# Patient Record
Sex: Male | Born: 1950 | Race: White | Hispanic: No | Marital: Married | Smoking: Former smoker
Health system: Southern US, Community
[De-identification: ages and names within clinical notes are randomized; demographics above are authoritative.]

## PROBLEM LIST (undated history)

## (undated) DIAGNOSIS — N529 Male erectile dysfunction, unspecified: Secondary | ICD-10-CM

## (undated) DIAGNOSIS — I10 Essential (primary) hypertension: Secondary | ICD-10-CM

## (undated) DIAGNOSIS — E785 Hyperlipidemia, unspecified: Secondary | ICD-10-CM

## (undated) DIAGNOSIS — R7989 Other specified abnormal findings of blood chemistry: Secondary | ICD-10-CM

## (undated) DIAGNOSIS — R000 Tachycardia, unspecified: Secondary | ICD-10-CM

## (undated) DIAGNOSIS — IMO0002 Reserved for concepts with insufficient information to code with codable children: Secondary | ICD-10-CM

## (undated) DIAGNOSIS — G2581 Restless legs syndrome: Secondary | ICD-10-CM

## (undated) DIAGNOSIS — M720 Palmar fascial fibromatosis [Dupuytren]: Secondary | ICD-10-CM

## (undated) DIAGNOSIS — G3183 Dementia with Lewy bodies: Secondary | ICD-10-CM

## (undated) DIAGNOSIS — R42 Dizziness and giddiness: Secondary | ICD-10-CM

## (undated) DIAGNOSIS — I951 Orthostatic hypotension: Secondary | ICD-10-CM

## (undated) DIAGNOSIS — G4730 Sleep apnea, unspecified: Secondary | ICD-10-CM

## (undated) DIAGNOSIS — E039 Hypothyroidism, unspecified: Secondary | ICD-10-CM

## (undated) DIAGNOSIS — M9250 Juvenile osteochondrosis of tibia and fibula, unspecified leg: Secondary | ICD-10-CM

## (undated) DIAGNOSIS — C4490 Unspecified malignant neoplasm of skin, unspecified: Secondary | ICD-10-CM

## (undated) DIAGNOSIS — M1990 Unspecified osteoarthritis, unspecified site: Secondary | ICD-10-CM

## (undated) DIAGNOSIS — I639 Cerebral infarction, unspecified: Secondary | ICD-10-CM

## (undated) DIAGNOSIS — R17 Unspecified jaundice: Secondary | ICD-10-CM

## (undated) DIAGNOSIS — N189 Chronic kidney disease, unspecified: Secondary | ICD-10-CM

## (undated) DIAGNOSIS — F329 Major depressive disorder, single episode, unspecified: Secondary | ICD-10-CM

## (undated) DIAGNOSIS — R413 Other amnesia: Secondary | ICD-10-CM

## (undated) DIAGNOSIS — R0600 Dyspnea, unspecified: Secondary | ICD-10-CM

## (undated) DIAGNOSIS — M92529 Juvenile osteochondrosis of tibia tubercle, unspecified leg: Secondary | ICD-10-CM

## (undated) DIAGNOSIS — F419 Anxiety disorder, unspecified: Secondary | ICD-10-CM

## (undated) DIAGNOSIS — K219 Gastro-esophageal reflux disease without esophagitis: Secondary | ICD-10-CM

## (undated) DIAGNOSIS — R945 Abnormal results of liver function studies: Secondary | ICD-10-CM

## (undated) DIAGNOSIS — M549 Dorsalgia, unspecified: Secondary | ICD-10-CM

## (undated) DIAGNOSIS — F32A Depression, unspecified: Secondary | ICD-10-CM

## (undated) DIAGNOSIS — F0280 Dementia in other diseases classified elsewhere without behavioral disturbance: Secondary | ICD-10-CM

## (undated) HISTORY — DX: Dorsalgia, unspecified: M54.9

## (undated) HISTORY — DX: Juvenile osteochondrosis of tibia and fibula, unspecified leg: M92.50

## (undated) HISTORY — DX: Orthostatic hypotension: I95.1

## (undated) HISTORY — DX: Sleep apnea, unspecified: G47.30

## (undated) HISTORY — DX: Dyspnea, unspecified: R06.00

## (undated) HISTORY — DX: Dementia in other diseases classified elsewhere, unspecified severity, without behavioral disturbance, psychotic disturbance, mood disturbance, and anxiety: F02.80

## (undated) HISTORY — DX: Unspecified malignant neoplasm of skin, unspecified: C44.90

## (undated) HISTORY — DX: Reserved for concepts with insufficient information to code with codable children: IMO0002

## (undated) HISTORY — DX: Other amnesia: R41.3

## (undated) HISTORY — PX: BASAL CELL CARCINOMA EXCISION: SHX1214

## (undated) HISTORY — DX: Juvenile osteochondrosis of tibia tubercle, unspecified leg: M92.529

## (undated) HISTORY — PX: CHOLECYSTECTOMY: SHX55

## (undated) HISTORY — PX: GALLBLADDER SURGERY: SHX652

## (undated) HISTORY — DX: Palmar fascial fibromatosis (dupuytren): M72.0

## (undated) HISTORY — DX: Depression, unspecified: F32.A

## (undated) HISTORY — PX: TONSILLECTOMY: SUR1361

## (undated) HISTORY — DX: Hypothyroidism, unspecified: E03.9

## (undated) HISTORY — DX: Dizziness and giddiness: R42

## (undated) HISTORY — DX: Anxiety disorder, unspecified: F41.9

## (undated) HISTORY — DX: Restless legs syndrome: G25.81

## (undated) HISTORY — DX: Male erectile dysfunction, unspecified: N52.9

## (undated) HISTORY — DX: Dementia with Lewy bodies: G31.83

## (undated) HISTORY — DX: Unspecified osteoarthritis, unspecified site: M19.90

## (undated) HISTORY — PX: APPENDECTOMY: SHX54

## (undated) HISTORY — DX: Major depressive disorder, single episode, unspecified: F32.9

---

## 1983-10-12 HISTORY — PX: CERVICAL FUSION: SHX112

## 1994-10-11 HISTORY — PX: LUMBAR FUSION: SHX111

## 1998-08-23 ENCOUNTER — Ambulatory Visit (HOSPITAL_COMMUNITY): Admission: RE | Admit: 1998-08-23 | Discharge: 1998-08-23 | Payer: Self-pay | Admitting: Neurosurgery

## 1998-08-23 ENCOUNTER — Encounter: Payer: Self-pay | Admitting: Neurosurgery

## 2000-09-20 ENCOUNTER — Emergency Department (HOSPITAL_COMMUNITY): Admission: EM | Admit: 2000-09-20 | Discharge: 2000-09-20 | Payer: Self-pay | Admitting: Emergency Medicine

## 2000-09-20 ENCOUNTER — Encounter: Payer: Self-pay | Admitting: Emergency Medicine

## 2000-12-13 ENCOUNTER — Ambulatory Visit (HOSPITAL_BASED_OUTPATIENT_CLINIC_OR_DEPARTMENT_OTHER): Admission: RE | Admit: 2000-12-13 | Discharge: 2000-12-13 | Payer: Self-pay | Admitting: Dentistry

## 2001-01-28 ENCOUNTER — Ambulatory Visit (HOSPITAL_COMMUNITY): Admission: RE | Admit: 2001-01-28 | Discharge: 2001-01-28 | Payer: Self-pay | Admitting: Neurosurgery

## 2001-01-28 ENCOUNTER — Encounter: Payer: Self-pay | Admitting: Neurosurgery

## 2001-10-11 HISTORY — PX: CARPAL TUNNEL RELEASE: SHX101

## 2001-11-22 ENCOUNTER — Emergency Department (HOSPITAL_COMMUNITY): Admission: EM | Admit: 2001-11-22 | Discharge: 2001-11-22 | Payer: Self-pay | Admitting: Emergency Medicine

## 2002-12-20 ENCOUNTER — Encounter (INDEPENDENT_AMBULATORY_CARE_PROVIDER_SITE_OTHER): Payer: Self-pay | Admitting: *Deleted

## 2002-12-20 ENCOUNTER — Ambulatory Visit (HOSPITAL_BASED_OUTPATIENT_CLINIC_OR_DEPARTMENT_OTHER): Admission: RE | Admit: 2002-12-20 | Discharge: 2002-12-20 | Payer: Self-pay | Admitting: Orthopedic Surgery

## 2003-04-25 ENCOUNTER — Ambulatory Visit (HOSPITAL_BASED_OUTPATIENT_CLINIC_OR_DEPARTMENT_OTHER): Admission: RE | Admit: 2003-04-25 | Discharge: 2003-04-25 | Payer: Self-pay | Admitting: Orthopedic Surgery

## 2006-06-20 ENCOUNTER — Encounter: Admission: RE | Admit: 2006-06-20 | Discharge: 2006-06-20 | Payer: Self-pay | Admitting: Family Medicine

## 2006-07-08 ENCOUNTER — Encounter (INDEPENDENT_AMBULATORY_CARE_PROVIDER_SITE_OTHER): Payer: Self-pay | Admitting: Specialist

## 2006-07-08 ENCOUNTER — Inpatient Hospital Stay (HOSPITAL_COMMUNITY): Admission: EM | Admit: 2006-07-08 | Discharge: 2006-07-13 | Payer: Self-pay | Admitting: Emergency Medicine

## 2006-07-19 ENCOUNTER — Encounter: Admission: RE | Admit: 2006-07-19 | Discharge: 2006-07-19 | Payer: Self-pay | Admitting: Surgery

## 2006-07-20 ENCOUNTER — Inpatient Hospital Stay (HOSPITAL_COMMUNITY): Admission: AD | Admit: 2006-07-20 | Discharge: 2006-07-29 | Payer: Self-pay | Admitting: Surgery

## 2006-07-27 ENCOUNTER — Ambulatory Visit: Payer: Self-pay | Admitting: Gastroenterology

## 2006-08-05 ENCOUNTER — Encounter: Admission: RE | Admit: 2006-08-05 | Discharge: 2006-08-05 | Payer: Self-pay | Admitting: Surgery

## 2006-08-10 ENCOUNTER — Encounter: Admission: RE | Admit: 2006-08-10 | Discharge: 2006-08-10 | Payer: Self-pay | Admitting: Surgery

## 2006-08-18 ENCOUNTER — Ambulatory Visit (HOSPITAL_COMMUNITY): Admission: RE | Admit: 2006-08-18 | Discharge: 2006-08-18 | Payer: Self-pay | Admitting: Surgery

## 2006-09-23 ENCOUNTER — Ambulatory Visit (HOSPITAL_COMMUNITY): Admission: RE | Admit: 2006-09-23 | Discharge: 2006-09-23 | Payer: Self-pay | Admitting: Gastroenterology

## 2006-09-24 ENCOUNTER — Inpatient Hospital Stay (HOSPITAL_COMMUNITY): Admission: EM | Admit: 2006-09-24 | Discharge: 2006-09-30 | Payer: Self-pay | Admitting: Emergency Medicine

## 2006-09-29 ENCOUNTER — Ambulatory Visit: Payer: Self-pay | Admitting: Gastroenterology

## 2006-10-04 ENCOUNTER — Emergency Department (HOSPITAL_COMMUNITY): Admission: EM | Admit: 2006-10-04 | Discharge: 2006-10-04 | Payer: Self-pay | Admitting: Emergency Medicine

## 2006-10-07 ENCOUNTER — Ambulatory Visit: Payer: Self-pay | Admitting: Gastroenterology

## 2006-10-07 LAB — CONVERTED CEMR LAB
ALT: 138 units/L — ABNORMAL HIGH (ref 0–40)
AST: 81 units/L — ABNORMAL HIGH (ref 0–37)
Albumin: 3.3 g/dL — ABNORMAL LOW (ref 3.5–5.2)
Alkaline Phosphatase: 418 units/L — ABNORMAL HIGH (ref 39–117)
BUN: 11 mg/dL (ref 6–23)
Basophils Absolute: 0.1 10*3/uL (ref 0.0–0.1)
Basophils Relative: 1.2 % — ABNORMAL HIGH (ref 0.0–1.0)
Bilirubin, Direct: 0.5 mg/dL — ABNORMAL HIGH (ref 0.0–0.3)
CO2: 31 meq/L (ref 19–32)
Calcium: 9.2 mg/dL (ref 8.4–10.5)
Chloride: 104 meq/L (ref 96–112)
Creatinine, Ser: 1.3 mg/dL (ref 0.4–1.5)
Eosinophil percent: 3.5 % (ref 0.0–5.0)
GFR calc non Af Amer: 61 mL/min
Glomerular Filtration Rate, Af Am: 74 mL/min/{1.73_m2}
Glucose, Bld: 89 mg/dL (ref 70–99)
HCT: 36.3 % — ABNORMAL LOW (ref 39.0–52.0)
Hemoglobin: 12.4 g/dL — ABNORMAL LOW (ref 13.0–17.0)
Lymphocytes Relative: 32.3 % (ref 12.0–46.0)
MCHC: 34.1 g/dL (ref 30.0–36.0)
MCV: 80.5 fL (ref 78.0–100.0)
Monocytes Absolute: 0.5 10*3/uL (ref 0.2–0.7)
Monocytes Relative: 7.1 % (ref 3.0–11.0)
Neutro Abs: 3.7 10*3/uL (ref 1.4–7.7)
Neutrophils Relative %: 55.9 % (ref 43.0–77.0)
Platelets: 299 10*3/uL (ref 150–400)
Potassium: 4.2 meq/L (ref 3.5–5.1)
RBC: 4.5 M/uL (ref 4.22–5.81)
RDW: 13.5 % (ref 11.5–14.6)
Sodium: 141 meq/L (ref 135–145)
Total Bilirubin: 1.3 mg/dL — ABNORMAL HIGH (ref 0.3–1.2)
Total Protein: 6.1 g/dL (ref 6.0–8.3)
WBC: 6.7 10*3/uL (ref 4.5–10.5)

## 2006-10-18 ENCOUNTER — Ambulatory Visit: Payer: Self-pay | Admitting: Gastroenterology

## 2006-10-18 LAB — CONVERTED CEMR LAB
ALT: 100 units/L — ABNORMAL HIGH (ref 0–40)
AST: 82 units/L — ABNORMAL HIGH (ref 0–37)
Albumin: 3.4 g/dL — ABNORMAL LOW (ref 3.5–5.2)
Alkaline Phosphatase: 292 units/L — ABNORMAL HIGH (ref 39–117)
BUN: 18 mg/dL (ref 6–23)
Basophils Absolute: 0.1 10*3/uL (ref 0.0–0.1)
Basophils Relative: 1.4 % — ABNORMAL HIGH (ref 0.0–1.0)
CO2: 31 meq/L (ref 19–32)
Calcium: 9.2 mg/dL (ref 8.4–10.5)
Chloride: 104 meq/L (ref 96–112)
Creatinine, Ser: 1.5 mg/dL (ref 0.4–1.5)
Eosinophil percent: 7.2 % — ABNORMAL HIGH (ref 0.0–5.0)
GFR calc non Af Amer: 52 mL/min
Glomerular Filtration Rate, Af Am: 63 mL/min/{1.73_m2}
Glucose, Bld: 101 mg/dL — ABNORMAL HIGH (ref 70–99)
HCT: 37.1 % — ABNORMAL LOW (ref 39.0–52.0)
Hemoglobin: 12.5 g/dL — ABNORMAL LOW (ref 13.0–17.0)
Lymphocytes Relative: 38.3 % (ref 12.0–46.0)
MCHC: 33.6 g/dL (ref 30.0–36.0)
MCV: 81.1 fL (ref 78.0–100.0)
Monocytes Absolute: 0.5 10*3/uL (ref 0.2–0.7)
Monocytes Relative: 10.7 % (ref 3.0–11.0)
Neutro Abs: 2.1 10*3/uL (ref 1.4–7.7)
Neutrophils Relative %: 42.4 % — ABNORMAL LOW (ref 43.0–77.0)
Platelets: 217 10*3/uL (ref 150–400)
Potassium: 4.1 meq/L (ref 3.5–5.1)
RBC: 4.58 M/uL (ref 4.22–5.81)
RDW: 13.9 % (ref 11.5–14.6)
Sodium: 140 meq/L (ref 135–145)
Total Bilirubin: 1.2 mg/dL (ref 0.3–1.2)
Total Protein: 6.5 g/dL (ref 6.0–8.3)
WBC: 5 10*3/uL (ref 4.5–10.5)

## 2006-10-19 ENCOUNTER — Ambulatory Visit: Payer: Self-pay | Admitting: Gastroenterology

## 2006-11-16 ENCOUNTER — Ambulatory Visit: Payer: Self-pay | Admitting: Gastroenterology

## 2006-11-16 LAB — CONVERTED CEMR LAB
ALT: 60 U/L — ABNORMAL HIGH
AST: 46 U/L — ABNORMAL HIGH
Albumin: 3.9 g/dL
Alkaline Phosphatase: 156 U/L — ABNORMAL HIGH
Bilirubin, Direct: 0.2 mg/dL
Total Bilirubin: 0.8 mg/dL
Total Protein: 6.6 g/dL

## 2007-01-20 ENCOUNTER — Ambulatory Visit: Payer: Self-pay | Admitting: Gastroenterology

## 2007-01-20 LAB — CONVERTED CEMR LAB
ALT: 78 units/L — ABNORMAL HIGH (ref 0–40)
AST: 78 units/L — ABNORMAL HIGH (ref 0–37)
Albumin: 3.8 g/dL (ref 3.5–5.2)
Alkaline Phosphatase: 111 units/L (ref 39–117)
BUN: 20 mg/dL (ref 6–23)
Basophils Absolute: 0 10*3/uL (ref 0.0–0.1)
Basophils Relative: 0.3 % (ref 0.0–1.0)
Bilirubin, Direct: 0.2 mg/dL (ref 0.0–0.3)
CO2: 31 meq/L (ref 19–32)
Calcium: 9.1 mg/dL (ref 8.4–10.5)
Chloride: 107 meq/L (ref 96–112)
Creatinine, Ser: 1.6 mg/dL — ABNORMAL HIGH (ref 0.4–1.5)
Eosinophils Absolute: 0.3 10*3/uL (ref 0.0–0.6)
Eosinophils Relative: 6 % — ABNORMAL HIGH (ref 0.0–5.0)
GFR calc Af Amer: 58 mL/min
GFR calc non Af Amer: 48 mL/min
Glucose, Bld: 121 mg/dL — ABNORMAL HIGH (ref 70–99)
HCT: 37.7 % — ABNORMAL LOW (ref 39.0–52.0)
Hemoglobin: 13.6 g/dL (ref 13.0–17.0)
Lymphocytes Relative: 32.1 % (ref 12.0–46.0)
MCHC: 36 g/dL (ref 30.0–36.0)
MCV: 83.3 fL (ref 78.0–100.0)
Monocytes Absolute: 0.7 10*3/uL (ref 0.2–0.7)
Monocytes Relative: 12.4 % — ABNORMAL HIGH (ref 3.0–11.0)
Neutro Abs: 2.7 10*3/uL (ref 1.4–7.7)
Neutrophils Relative %: 49.2 % (ref 43.0–77.0)
Platelets: 164 10*3/uL (ref 150–400)
Potassium: 4.9 meq/L (ref 3.5–5.1)
RBC: 4.52 M/uL (ref 4.22–5.81)
RDW: 13.4 % (ref 11.5–14.6)
Sodium: 142 meq/L (ref 135–145)
Total Bilirubin: 1.3 mg/dL — ABNORMAL HIGH (ref 0.3–1.2)
Total Protein: 6 g/dL (ref 6.0–8.3)
WBC: 5.4 10*3/uL (ref 4.5–10.5)

## 2007-01-25 ENCOUNTER — Ambulatory Visit: Payer: Self-pay | Admitting: Gastroenterology

## 2007-01-25 LAB — CONVERTED CEMR LAB
AFP-Tumor Marker: 7.5 ng/mL (ref 0.0–8.0)
Ammonia: 24 umol/L (ref 11–35)
Ceruloplasmin: 28 mg/dL (ref 21–63)
Ferritin: 65.3 ng/mL (ref 22.0–322.0)
HCV Ab: NEGATIVE
Hep B S Ab: NEGATIVE
Hepatitis B Surface Ag: NEGATIVE
INR: 1.2 (ref 0.9–2.0)
Prothrombin Time: 13.3 s (ref 10.0–14.0)
aPTT: 24.9 s — ABNORMAL LOW (ref 26.5–36.5)

## 2007-01-26 ENCOUNTER — Ambulatory Visit: Payer: Self-pay | Admitting: Internal Medicine

## 2007-01-31 ENCOUNTER — Ambulatory Visit: Payer: Self-pay | Admitting: Gastroenterology

## 2007-02-15 ENCOUNTER — Ambulatory Visit (HOSPITAL_COMMUNITY): Admission: RE | Admit: 2007-02-15 | Discharge: 2007-02-15 | Payer: Self-pay | Admitting: Neurology

## 2007-02-24 ENCOUNTER — Encounter: Admission: RE | Admit: 2007-02-24 | Discharge: 2007-05-25 | Payer: Self-pay | Admitting: Neurology

## 2007-03-01 ENCOUNTER — Encounter: Admission: RE | Admit: 2007-03-01 | Discharge: 2007-03-17 | Payer: Self-pay | Admitting: Psychology

## 2007-08-03 ENCOUNTER — Encounter: Admission: RE | Admit: 2007-08-03 | Discharge: 2007-08-03 | Payer: Self-pay | Admitting: Chiropractic Medicine

## 2007-10-02 ENCOUNTER — Encounter: Admission: RE | Admit: 2007-10-02 | Discharge: 2007-10-02 | Payer: Self-pay | Admitting: Family Medicine

## 2009-07-18 ENCOUNTER — Ambulatory Visit: Payer: Self-pay | Admitting: Cardiovascular Disease

## 2009-07-18 DIAGNOSIS — Z85828 Personal history of other malignant neoplasm of skin: Secondary | ICD-10-CM | POA: Insufficient documentation

## 2009-07-18 DIAGNOSIS — F329 Major depressive disorder, single episode, unspecified: Secondary | ICD-10-CM | POA: Insufficient documentation

## 2009-07-18 DIAGNOSIS — I951 Orthostatic hypotension: Secondary | ICD-10-CM | POA: Insufficient documentation

## 2009-07-18 DIAGNOSIS — F411 Generalized anxiety disorder: Secondary | ICD-10-CM | POA: Insufficient documentation

## 2009-08-01 ENCOUNTER — Ambulatory Visit: Payer: Self-pay | Admitting: Internal Medicine

## 2009-08-01 ENCOUNTER — Encounter: Payer: Self-pay | Admitting: Cardiovascular Disease

## 2009-08-01 ENCOUNTER — Ambulatory Visit: Payer: Self-pay

## 2009-08-01 ENCOUNTER — Ambulatory Visit (HOSPITAL_COMMUNITY): Admission: RE | Admit: 2009-08-01 | Discharge: 2009-08-01 | Payer: Self-pay | Admitting: Internal Medicine

## 2009-09-19 DIAGNOSIS — M928 Other specified juvenile osteochondrosis: Secondary | ICD-10-CM | POA: Insufficient documentation

## 2009-09-19 DIAGNOSIS — R42 Dizziness and giddiness: Secondary | ICD-10-CM | POA: Insufficient documentation

## 2009-09-19 DIAGNOSIS — M129 Arthropathy, unspecified: Secondary | ICD-10-CM | POA: Insufficient documentation

## 2009-09-19 DIAGNOSIS — E039 Hypothyroidism, unspecified: Secondary | ICD-10-CM | POA: Insufficient documentation

## 2009-09-19 DIAGNOSIS — M1990 Unspecified osteoarthritis, unspecified site: Secondary | ICD-10-CM | POA: Insufficient documentation

## 2009-09-19 DIAGNOSIS — R0602 Shortness of breath: Secondary | ICD-10-CM | POA: Insufficient documentation

## 2009-09-19 DIAGNOSIS — M549 Dorsalgia, unspecified: Secondary | ICD-10-CM | POA: Insufficient documentation

## 2009-09-19 DIAGNOSIS — M542 Cervicalgia: Secondary | ICD-10-CM | POA: Insufficient documentation

## 2009-09-19 DIAGNOSIS — IMO0002 Reserved for concepts with insufficient information to code with codable children: Secondary | ICD-10-CM | POA: Insufficient documentation

## 2010-11-01 ENCOUNTER — Encounter: Payer: Self-pay | Admitting: Gastroenterology

## 2010-11-10 NOTE — Assessment & Plan Note (Signed)
Summary: NP6/AMD   Visit Type:  Initial Consult Primary Provider:  STONE  CC:  ORTHOSTASIS.  History of Present Illness: 60 year-old male presents for initial evaluation of dizziness and orthostatic hypotension.  Describes episodic dizziness and near-syncope, occurring with position changes. Apparently had orthostatic hypotension documented in his primary physician's office.  Also with exertional dyspnea that occurs with physical activity and singing.  The pt's wife has noticed that he is short of breath during singing at [place]. The patient feels this all started because of an inhalation injury, but his PFT's were normal by his report (was told he had the lungs of a 60 year-old).   The patient was told at one time that he may have a 'thickened cardiac muscle', but he has never had an echo. EKG's have been ok in the past by family report.  The patient denies chest pain, palpitations, leg edema, orthopnea, or PND.  Preventive Screening-Counseling & Management  Alcohol-Tobacco     Alcohol drinks/day: 0     Smoking Status: quit     Year Quit: 1977  Caffeine-Diet-Exercise     Caffeine use/day: 0     Diet Comments: REGULAR     Does Patient Exercise: no      Drug Use:  no.    Current Medications (verified): 1)  Geodon 60 Mg Caps (Ziprasidone Hcl) .... 2 Tabt By Mouth At Night 2)  Lexapro 20 Mg Tabs (Escitalopram Oxalate) .... 2 Tab By Mouth Daily 3)  Alprazolam 1 Mg Tabs (Alprazolam) .Marland Kitchen.. 1 Tab By Mouth 3 Times A Day 4)  Levoxyl 175 Mcg Tabs (Levothyroxine Sodium) .Marland Kitchen.. 1 Tab By Mouth Daily 5)  Omeprazole 20 Mg Cpdr (Omeprazole) .Marland Kitchen.. 1 Tab By Mouth Daily 6)  Darvocet-N 100 100-650 Mg Tabs (Propoxyphene N-Apap) .Marland Kitchen.. 1 Tab By Mouth Three Times A Day 7)  Fish Oil 1000 Mg Caps (Omega-3 Fatty Acids) .Marland Kitchen.. 1 Tab By Mouth 3 Times A Day 8)  Calcium Carbonate 600 Mg Tabs (Calcium Carbonate) .... 2 Tab By Mouth Daily 9)  Vitamin B-1 100 Mg Tabs (Thiamine Hcl) .Marland Kitchen.. 1 Tab By Mouth Daily 10)   Multivitamins  Tabs (Multiple Vitamin) .Marland Kitchen.. 1 Tab By Mouth Daily  Allergies (verified): 1)  ! Pcn 2)  ! * Lamtical 3)  ! * Sulfur 4)  ! * Oxycontin 5)  ! Vicodin  Past History:  Past Medical History: Anxiety Skin cancer- basal cell on back x2 Depression fatigue elevated kidney enzymes erectile dysfunction arthritis thyroid disease lewy body dementia  Past Surgical History: gallbladder 2007-2008 basal cell carcinoma- 2008, 2010 lumbar spinal 1996 cervical fusion 1985 carpal tunnel 2003  Family History: Mother: + CAD + MI + stents in neck and heart ? kidneys Father: liver failure Siblings: sister: cirrhosis ([place])               brother- back problems  Social History: Disabled  Married  2 children Tobacco Use - Former.  Regular Exercise - no Drug Use - no Alcohol drinks/day:  0 Smoking Status:  quit Caffeine use/day:  0 Diet Comments:  REGULAR Does Patient Exercise:  no Drug Use:  no  Review of Systems       memory impairment, dementia, poor balance, depression, anxiety, erectile dysfunction, and back and joint pains. Otherwise negative except as per HPI.  Vital Signs:  Patient profile:   60 year old male Height:      68 inches Weight:      178.25 pounds BMI:  27.20 Pulse rate:   70 / minute Pulse (ortho):   61 / minute Pulse rhythm:   regular Resp:     16 per minute  Vitals Entered By: Mercer Pod (July 18, 2009 2:17 PM)    Physical Exam  General:  Supine blood pressure: 127/78, heart rate 60 Sitting blood pressure: 132/75, heart rate 66 Standing blood pressure: 144/82, heart rate 61  Pt is well-developed, flat affect, alert and oriented, no acute distress HEENT: normal Neck: no thyromegaly           JVP normal, carotid upstrokes normal without bruits Lungs: CTA Chest: equal expansion  CV: Apical impulse nondisplaced, RRR without murmur or gallop Abd: soft, NT, positive BS, no HSM, no bruit Back: no CVA tenderness Ext: no  clubbing, cyanosis, or edema        femoral pulses 2+ without bruits        pedal pulses 2+ and equal Skin: warm, dry, no rash Neuro: CNII-XII intact,strength 5/5 = b/l    EKG  Procedure date:  07/18/2009  Findings:      NSR, within normal limits, HR 62 bpm.  Impression & Recommendations:  Problem # 1:  ORTHOSTATIC HYPOTENSION (ICD-458.0) The patient's EKG and cardiovascular exam are within normal limits. 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 He could have periodic orthostatic hypotension related to this. Recommend a 2-D echocardiogram to rule out any structural heart disease. If this is normal, would not pursue further cardiac evaluation. I spoke with the patient and his wife about the importance of maintaining good fluid hydration, but this is difficult because of urinary incontinence. I will followup with the patient after his echocardiogram is complete. Orders: Echocardiogram (Echo)  Patient Instructions: 1)  Your physician recommends that you schedule a follow-up appointment in: 2 months 2)  Your physician has requested that you have an echocardiogram.  Echocardiography is a painless test that uses sound waves to create images of your heart. It provides your doctor with information about the size and shape of your heart and how well your heart's chambers and valves are working.  This procedure takes approximately one hour. There are no restrictions for this procedure.

## 2011-02-12 ENCOUNTER — Ambulatory Visit
Admission: RE | Admit: 2011-02-12 | Discharge: 2011-02-12 | Disposition: A | Discharge status: Home / Self Care | Payer: MEDICARE | Source: Ambulatory Visit | Attending: Internal Medicine | Admitting: Internal Medicine

## 2011-02-12 ENCOUNTER — Other Ambulatory Visit: Payer: Self-pay | Admitting: Internal Medicine

## 2011-02-12 DIAGNOSIS — M26609 Unspecified temporomandibular joint disorder, unspecified side: Secondary | ICD-10-CM

## 2011-02-12 MED ORDER — IOHEXOL 300 MG/ML  SOLN
75.0000 mL | Freq: Once | INTRAMUSCULAR | Status: AC | PRN
  Administered 2011-02-12: 75 mL via INTRAVENOUS

## 2011-02-26 NOTE — H&P (Signed)
NAME:  Hayden Rose, Hayden Rose NO.:  [id number]   MEDICAL RECORD NO.:  [id number]          PATIENT TYPE:  EMS   LOCATION:  ED                           FACILITY:  [facility]   PHYSICIAN:  Clovis Pu. Cornett, M.D.DATE OF BIRTH:  [birth year]   DATE OF ADMISSION:  07/08/2006  DATE OF DISCHARGE:                                HISTORY & PHYSICAL   CHIEF COMPLAINT:  Abdominal pain.   HISTORY OF PRESENT ILLNESS:  The patient is a 60 year old male with a two-  day history of epigastric right upper quadrant pain.  This started with some  indigestion-type symptoms about two days ago and has progressed to become  quite steady and 4-6/10 in intensity in his epigastrium and right upper  quadrant with radiation to his upper back.  It is associated with nausea and  vomiting.  He has also had no bowel movement for four days.  He is on  multiple medications for depression and degenerative disk disease and  degenerative joint disease.  It is unclear if eating makes it better or  worse in talking with the patient.   PAST MEDICAL HISTORY:  1. Degenerative joint disease.  2. Degenerative disk disease.  3. Depression.  4. Hypertension.  5. Hypothyroidism.   FAMILY HISTORY:  Noncontributory.   SOCIAL HISTORY:  He is married.  Denies tobacco or alcohol use.   ALLERGIES:  1. OXYCONTIN.  2. PENICILLIN.  3. SULFA.   MEDICATIONS:  1. Alprazolam 1 mg t.i.d.  2. Cyclobenzaprine 10 mg t.i.d.  3. Geodon 120 mg at bed time.  4. Hydrochlorothiazide 25 mg once daily.  5. Loratadine 10 mg once daily.  6. Mirtazapine 50 mg at bed time.  7. Naprosyn 500 mg b.i.d.  8. Omeprazole 20 mg once daily.  9. Acetaminophen 100 mg t.i.d.   PAST SURGICAL HISTORY:  History of back, neck and right wrist surgeries.   REVIEW OF SYSTEMS:  As stated above.  The 15-point review of systems is  otherwise negative.   PHYSICAL EXAMINATION:  VITAL SIGNS:  Temperature 97.4, pulse 96, blood  pressure 126/82.  GENERAL  APPEARANCE:  White male in no apparent distress.  HEENT:  Extraocular movements are intact.  No evidence of scleral icterus.  Oropharynx clear.  NECK:  Supple, nontender.  Full range of motion.  CHEST:  Clear to auscultation.  Chest wall motion normal.  CARDIOVASCULAR:  Regular rate and rhythm without murmur, rub or gallop.  ABDOMEN:  Soft with tenderness in the right upper quadrant over his right  costal margin.  There is some mild distention.  He does have some fullness  in his abdomen.  There is no organomegaly.  EXTREMITIES:  Muscle tone is normal.  Range of motion is normal.  NEUROLOGIC:  He has depressed mood and affect.  Otherwise, his motor and  sensory function are grossly intact.   DIAGNOSTIC STUDIES:  An ultrasound report shows gallstones and a thickened  gallbladder wall.  White count 12,300, hemoglobin 13.6.  Sodium 139,  potassium 4.2, chloride 95, CO2 33, BUN 20, creatinine 1.7, glucose 99,  total  bilirubin 1.6, alk. phos. normal.  AST and ALT are slightly elevated  at 48 and 53.  Urinalysis is normal.   IMPRESSION:  Acute cholecystitis and questionable gallstone pancreatitis.   PLAN:  We will admit for IV fluid, pain control and antibiotics.  Send  amylase and lipase tonight.  If these are within normal limits, we will  proceed with cholecystectomy in the morning.  I discussed the procedure with  the patient and his family to include the complications of both laparoscopy  and open cholecystectomy to include bleeding, infection and injury to other  structures.  Also, there may be a need to convert.  He may also have passed  a common duct stone and has to be evaluated with cholangiogram which will be  done at the time of operation.      Thomas A. Cornett, M.D.  Electronically Signed     TAC/MEDQ  D:  07/08/2006  T:  07/10/2006  Job:  [id number]

## 2011-02-26 NOTE — Op Note (Signed)
NAME:  Hayden, Rose NO.:  [id number]   MEDICAL RECORD NO.:  [id number]          PATIENT TYPE:  INP   LOCATION:  5743                         FACILITY:  [facility]   PHYSICIAN:  Georgiana Spinner, M.D.    DATE OF BIRTH:  05/20/1951   DATE OF PROCEDURE:  09/24/2006  DATE OF DISCHARGE:                               OPERATIVE REPORT   Mrs. Zeidman called me stating that yesterday Mr. Branam had an ERCP with  stent removal and two stone removals. He went home, got chilled, and she  called to the hospital and spoke to the procedure nurse and also this  was apparently discussed with Hedwig Morton. Juanda Chance, M.D. thinking this may  have been anesthesia related. However today, Saturday morning, the day  after the procedure the patient is complaining of fever, flushing, and I  suggested that she bring him to the emergency room for further  evaluation. Time is approximately 12:20 p.m. Saturday, September 24, 2006.           ______________________________  Georgiana Spinner, M.D.     GMO/MEDQ  D:  09/24/2006  T:  09/25/2006  Job:  [id number]   cc:   Teena Irani. Arlyce Dice, M.D.

## 2011-02-26 NOTE — Op Note (Signed)
NAME:  Hayden Rose, Hayden Rose                           ACCOUNT NO.:  [id number]   MEDICAL RECORD NO.:  [id number]                   PATIENT TYPE:  AMB   LOCATION:  DSC                                  FACILITY:  [facility]   PHYSICIAN:  Katy Fitch. Naaman Plummer., M.D.          DATE OF BIRTH:  [birth year]   DATE OF PROCEDURE:  04/25/2003  DATE OF DISCHARGE:                                 OPERATIVE REPORT   PREOPERATIVE DIAGNOSIS:  1. Chronic ulnar styloid ulnocarpal impingement, status post previous     hemiresection arthroplasty of distal radioulnar joint, right wrist.  2. Painful pisotriquetral articulation of right wrist with x-ray evidence of     cystic degenerative change at distal pole of pisiform.   POSTOPERATIVE DIAGNOSIS:  1. Chronic ulnar styloid ulnocarpal impingement, status post previous     hemiresection arthroplasty of distal radioulnar joint, right wrist.  2. Painful pisotriquetral articulation of right wrist with x-ray evidence of     cystic degenerative change at distal pole of pisiform.   OPERATION PERFORMED:  1. Excision of ulnar styloid through ulnar approach between sixth dorsal     compartment and flexor carpi ulnaris.  2. Resection of pisiform with repair of flexor carpi ulnaris tendon.   SURGEON:  Katy Fitch. Sypher, M.D.   ASSISTANT:  Jonni Sanger, P.A.   ANESTHESIA:  General by LMA.   SUPERVISING ANESTHESIOLOGIST:  Janetta Hora. Gelene Mink, M.D.   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  He is status post diagnostic  wrist arthroscopy which revealed evidence of chronic ulnocarpal abutment and  cartilaginous injury to the ulnar aspect of the lunate and triangular  fibrocartilage tear.  He is status post hemiresection arthroplasty due to a  painful distal radioulnar joint; however, due to the fact that he had a long  variant of the ulnar styloid, he appeared to have a chronic pain  syndrome  due to ulnar styloid carpal impingement.  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  Plain films of  his pisotriquetral joint demonstrated an erosion on the distal surface of  the pisiform and some irregularity of the pisotriquetral articulation.  There is some cystic change in the triquetrum as well suggesting a chronic  arthrosis.  Preoperative injection of the pisotriquetral joint with Depo-  Medrol and lidocaine gave transient relief.  After informed consent, he is  brought to the operating room at this time anticipating styloid excision and  pisiform excision.   DESCRIPTION OF PROCEDURE:  [name] was brought to the operating room and  placed in supine position on the operating table.  Following induction of  general anesthesia by LMA, 1g of Ancef was administered as an IV  prophylactic antibiotic.  The right arm was prepped with Betadine soap and  solution and sterilely draped.  A pneumatic tourniquet was applied to the  proximal brachium.   Following exsanguination of the right arm with an Esmarch bandage, the  arterial tourniquet was inflated to 230 mmHg.  The procedure commenced with  a longitudinal incision directly over the subcutaneous border of the ulna  between the sixth dorsal compartment and the flexor carpi ulnaris.  Care was  taken to carefully dissect the dorsal ulnar sensory branch.  The extensor  retinaculum was released on the ulnar aspect of the sixth dorsal compartment  and the extensor carpi ulnaris was retracted dorsally.  The styloid was  palpated and a Beaver blade was used to subperiosteally expose the distal  centimeter of the ulnar styloid.  A 2 mm osteotome was used to expose the  entire styloid subperiosteally followed by placement of baby Bennett  retractors.  A rongeur was used to piecemeal remove the ulnar  styloid down  to a level 1 mm below the height of the distal radiolunate facet.   The soft tissues were then irrigated thoroughly and palpated the residual  bone fragments.  The tear in the triangular fibrocartilage was easily  visualized.  The dead space created was closed with a mattress suture of 3-0  Ethibond followed by repair of the retinaculum with mattress suture of 3-0  Ethibond and repair of the skin with intradermal 3-0 Prolene. The  pisotriquetral joint had been carefully examined preoperatively.  [name]  experienced pain with ulnar deviation of the wrist, dorsiflexion and grind  of the pisotriquetral joint as well as full palmar flexion and grind of the  pisotriquetral joint.  Plain films had shown a cystic change in the distal  pole of the pisiform. Therefore, we elected to proceed with resection.   A curvilinear incision was fashioned directly over the ulnar aspect of the  palm at the distal wrist flexion crease.  Subcutaneous tissue were carefully  divided taking care to elevate the hypothenar muscles off the pisiform.  The  flexor carpi ulnaris was split in its midline and Beaver blades and a 2 mm  osteotome was used to circumferentially dissect the pisiform from the flexor  carpi ulnaris tendon.  The pisiform was removed piecemeal in its entirety.  Care was taken to protect the ulnar nerve throughout dissection.  The C-arm  fluoroscope was used to confirm satisfactory resection of the entire  pisiform.   The wound was inspected for bleeding points and subsequently, the flexor  carpi ulnaris was repaired with a mattress suture of 3-0 Ethibond.  The skin  was repaired with intradermal 3-0 Prolene.  There were no apparent  complications.   [name] tolerated the surgery and anesthesia well.  He was placed in a  voluminous gauze dressing with a volar plaster splint maintaining the wrist in 5 degrees dorsiflexion.  For aftercare he will be given prescriptions for   [facility] one to two tablets by mouth every four to six hours as  needed for pain, 30 tablets without refill, also Keflex 500 mg one by mouth  every eight hours times four days as a prophylactic antibiotic.                                                Katy Fitch Naaman Plummer., M.D.  RVS/MEDQ  D:  04/25/2003  T:  04/25/2003  Job:  [id number]

## 2011-02-26 NOTE — Assessment & Plan Note (Signed)
[facility]                         GASTROENTEROLOGY OFFICE NOTE   DINARI, STGERMAINE                        MRN:          [id number]  DATE:10/07/2006                            DOB:          28-Feb-1951    PRIMARY GASTROENTEROLOGIST:  Barbette Hair. Arlyce Dice, MD, [facility]   GI PROBLEM LIST:  1. Complex biliary history beginning with complicated cholecystectomy      in October of 2007 complicated by bile duct leak.  Status post      endoscopic retrograde cholangiopancreatography with stent placement      in October of 2007 by Dr. [name].  The stent was removed in      December of 2007.  Two bile duct stones were seen.  These were also      removed.  That night, the patient returned with sepsis and      cholangitis.  His clinical condition improved with antibiotics      after two to three days, but his liver tests then bumped back up.      Repeat endoscopic retrograde cholangiopancreatography was done by      me on September 28, 2006.  No recurrent stones were seen, but an      adequate biliary sphincterotomy was done to insure adequate      drainage.   INTERVAL HISTORY:  I last saw Mr. Lambertson when he was discharged from the  hospital about a week ago.  He represented on Christmas day which was  two or three days ago with some chills and some low-grade fevers.  Fortunately, his blood tests were much improved, and he was reassured  and sent home from the emergency room.  He is on his last day now of  ciprofloxacin 500 mg p.o. twice a day and presented today with continued  low-grade fevers but clinically continues to improve.  His pain is  present but is much improved.  He feels well.  His fatigue is improving.   LABORATORY DATA:  Lab tests done just prior to this visit show a normal  white cell count.  His bilirubin is 1.3 which is down from 1.4 three  days ago.  His alkaline phosphatase is still elevated although  improving.  Today it is 418.  His  transaminases are both abnormal but  are both improved from two days ago as well.   PHYSICAL EXAMINATION:  VITAL SIGNS:  Temperature 98.5, blood pressure  102/68, pulse 68.  CONSTITUTIONAL:  Generally well-appearing.  ABDOMEN:  Soft, nontender, nondistended.  Normal bowel sounds.   ASSESSMENT AND PLAN:  A 60 year old man with complicated biliary  history.   His liver tests have not completely normalized, although they are  trending down.  I think to be safe since they are still abnormal, I will  keep him on ciprofloxacin for 10 more days for the possibility that he  has an occult infection.  He will return then in 10 days to see either  myself or Dr. Arlyce Dice as an outpatient.  They know how to get in touch  with [place] if there are any further  concerns prior to then.  These low-  grade fevers that he has been having are pretty  underwhelming.  The most his wife says he has been is 99.8 or 99.9, and  today his temperature was normal.     Rachael Fee, MD  Electronically Signed    DPJ/MedQ  DD: 10/07/2006  DT: 10/07/2006  Job #: [id number]   cc:   Barbette Hair. Arlyce Dice, MD,FACG

## 2011-02-26 NOTE — Assessment & Plan Note (Signed)
[facility]                         GASTROENTEROLOGY OFFICE NOTE   SAJAN, CHEATWOOD                        MRN:          [id number]  DATE:01/25/2007                            DOB:          30-Apr-1951    PROBLEM:  Confusion.   REASON:  Mr. Hitchner has returned for reevaluation. Over the last 2  months he has been increasingly confused. He has had tremors. His  activity level has decreased. He was evaluated by Dr. Weinman__________  from neurology. An elevated ammonia level was demonstrated of 132. Liver  tests drawn yesterday were actually improved. Bilirubin was 1.3, alk  phos normal, AST 78, and albumin 3.8. There is no history of liver  disease including jaundice or hepatitis. 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   FAMILY HISTORY:  Negative for liver disease.   MEDICATIONS:  Include; cyclobenzaprine, Naprosyn, omeprazole, tramadol,  __________, Levoxyl, Geodon, alprazolam, loratadine, Lexapro, Darvocet,  Aricept, and now lactulose.   On exam he is alert and oriented x3. __________ is present.   PHYSICAL EXAMINATION:  HEENT: EOMI. PERRLA. Sclerae are anicteric.  Conjunctivae are pink.  NECK:  Supple without thyromegaly, adenopathy or carotid bruits.  CHEST:  Clear to auscultation and percussion without adventitious  sounds.  CARDIAC:  Regular rhythm; normal S1 S2.  There are no murmurs, gallops  or rubs.  ABDOMEN:  Bowel sounds are normoactive.  Abdomen is soft, non-tender and  non-distended.  There are no abdominal masses, tenderness, splenic  enlargement or hepatomegaly.  EXTREMITIES:  Full range of motion.  No cyanosis, clubbing or edema.  RECTAL:  Deferred.  There is no stigmata of liver disease.   IMPRESSION:  Mental confusion. With his elevated amylase I suspect that  he has a metabolic encephalopathy. There is no stigmata of liver disease  and I believe that cryptogenic cirrhosis  is less likely.   RECOMMENDATION:  1. Follow up abdominal ultrasound.  2. Check serologies for hepatitis B and C, AMA, ANA, alpha 1      fetoprotein levels, PT, PTT.  3. Continue lactulose.     Barbette Hair. Arlyce Dice, MD,FACG  Electronically Signed    RDK/MedQ  DD: 01/25/2007  DT: 01/25/2007  Job #: [id number]   cc:   Gloriajean Dell. Andrey Campanile, M.D.

## 2011-02-26 NOTE — Discharge Summary (Signed)
NAME:  Hayden Rose, Hayden Rose NO.:  [id number]   MEDICAL RECORD NO.:  [id number]          PATIENT TYPE:  INP   LOCATION:  6703                         FACILITY:  [facility]   PHYSICIAN:  Clovis Pu. Cornett, M.D.DATE OF BIRTH:  [birth year]   DATE OF ADMISSION:  07/20/2006  DATE OF DISCHARGE:  07/29/2006                                 DISCHARGE SUMMARY   ADMITTING DIAGNOSIS:  Status post open cholecystectomy, with cystic duct  stump leak.   DISCHARGE DIAGNOSES:  Status post open cholecystectomy, with cystic duct  stump leak.   PROCEDURES PERFORMED:  1. CT scan of the abdomen pelvis.  2. Percutaneous drainage of biloma.  3. ERCP by Dr. Arlyce Dice.   BRIEF HISTORY:  The patient is a 60 year old male who underwent an open  cholecystectomy at [facility] on  July 09, 2006 for gangrenous  cholecystitis.  He was readmitted on July 20, 2006 due to abdominal pain  and CT scan showing fluid and an elevation in his liver function studies.  ERCP was performed by Dr. Arlyce Dice which showed a cystic duct stump leakage,  and he was stented.  Percutaneous drainage of the biloma was performed as  well.   HOSPITAL COURSE:  The patient was in the hospital for the next nine days to  pain issues.  A repeat CT scan showed the decreasing size of his biloma, and  his white count slowly improved on IV Cipro.  He was having some fever, and  this resolved.  His diet was advanced, and he was discharged home and  July 29, 2006 after week's course of antibiotics and better pain control.   DISCHARGE INSTRUCTIONS:  The patient will follow up in 1-2 weeks.  His drain  will be left in place twice until I see him back in the office and we repeat  his HIDA study.  He will go home on Cipro 500 mg p.o. b.i.d., and Vicodin  for pain.  He will resume his preoperative medications.   CONDITION AT DISCHARGE:  Improved.      Thomas A. Cornett, M.D.  Electronically Signed     TAC/MEDQ  D:  08/25/2006   T:  08/25/2006  Job:  [id number]

## 2011-02-26 NOTE — Discharge Summary (Signed)
NAME:  Hayden Rose, Hayden Rose NO.:  [id number]   MEDICAL RECORD NO.:  [id number]          PATIENT TYPE:  INP   LOCATION:  5743                         FACILITY:  [facility]   PHYSICIAN:  Rachael Fee, MD   DATE OF BIRTH:  01/22/1951   DATE OF ADMISSION:  09/24/2006  DATE OF DISCHARGE:  09/30/2006                               DISCHARGE SUMMARY   ADMITTING DIAGNOSIS:  1. Abdominal pain chills, fevers following endoscopic retrograde      cholangiopancreatography stent removal and stone removal on      September 22, 2006.  2. History of open cholecystectomy of gangrenous gallbladder on      July 09, 2006.  3. Postoperative bile duct leak with readmission July 20, 2006 to      July 29, 2006.  Underwent a percutaneous drainage of biloma.      Underwent endoscopic retrograde cholangiopancreatography with      placement of stent to the bile duct on November 11 by Dr. Arlyce Dice.  4. Severe long-term chronic depression.  5. Status post appendectomy.  6. Status post tonsillectomy.  7. History of cervical fusion.  8. History of lower spine surgery twice.  9. Hypertension.  10.History of Osgood-Schlatter disease.  11.History of tension headaches.  12.History of hypothyroidism.   DISCHARGE DIAGNOSIS:  1. Abdominal pain, fever and chills following endoscopic retrograde      cholangiopancreatography, stone and stent removal.  2. Probably fleeting cholangitis.  3. Status post endoscopic retrograde cholangiopancreatography by Dr.      Rob Bunting on September 28, 2006.  The exam was normal.  There      were no retained stones or sludge.  A biliary sphincterotomy was      performed.  Noted, was retained food in the stomach, question      whether the gastroparesis is from narcotics or an underlying      process.  4. Chronic pain for which he is on multiple analgesics and neurologic      agents, chronic narcotics.  5. Profound depression, suspect his depression colors his  perception      of somatic pain.  6. Abnormal LFTs secondary to cholangitis.  No evidence for ERCP-      associated pancreatitis.  7. Mildly elevated glucose with maximum serum glucose of 130.   PROCEDURES:  ERCP by Dr. Christella Hartigan.   CONSULTATIONS:  None.   BRIEF HISTORY:  [name] is a 60 year old white male who suffers from  profound depression.  His GI problems date back at least to his open  cholecystectomy in September 2007, which was a open procedure because of  a gangrenous gallbladder.  He developed postoperative leak and was  readmitted.  He had ERCP with stent placement.  He also drainage of the  biloma.  On September 22, 2006, he had an outpatient ERCP and removal of  the stent.  Two stones were encountered and Dr. Arlyce Dice removed these  stones.  He did not perform a sphincterotomy.  That evening the patient  developed chills and subjective fevers, crampy pain, and was feeling  quite unwell.  He returned to the emergency room and was admitted for  further care by Dr. Sabino Gasser, who was covering GI service for Dr.  Arlyce Dice.  At the time of his arrival in the emergency room, his  temperature reached a measurement of 101.6.   LABORATORY:  Initial total bilirubin 6.3, it was 4.2 at discharge.  Alkaline phosphatase initially 203, it was 466 at discharge.  AST was  195 on admission, 170 at discharge.  ALT 272, 213 at discharge.  Amylase  66, lipase 18.  Albumin 3.3.  BUN 6, creatinine 1.2.  Sodium 138,  potassium 4.4.  Chloride 106, CO2 27.  Glucose ranged between 130-100.  PT 14.1, INR 1.1.  Initial white blood cell count 11, corrected to 7.7.  Hemoglobin 12.6, hematocrit 36.8.  MCV 80.5.  Platelets low of 125, 207  at discharge.  Blood cultures negative growth.  CT scan on September 24, 2006, showed no evidence for bile leak or hematoma and was generally  unremarkable.  CT scan of September 29, 2006, 1-day following the ERCP by  Dr. Christella Hartigan, showed slight stranding in the fat  planes surrounding the  course of the extrahepatic biliary ducts, may represent a reaction to  the ERCP sphincterotomy.  No focal fluid collection or leak identified.  There was a small amount of fluid along the inferior aspect right lobe  of liver that may represent residue from prior leak.  Prominent  appearance of the second portion of the duodenum noted.  External  intraluminal hematoma cannot be excluded.  Pelvic images were within  normal limits.   HOSPITAL COURSE:  1. Cholangitis and jaundice.  The patient's symptoms of abdominal pain      were slow to improve. The patient was treated with the following      antibiotics metronidazole p.o. and Cipro IV, actually converted to      p.o. Cipro.  He is to go home on all oral ciprofloxacin only.      However, he was eating well and ambulating.  Note, that the patient      is narcotic-dependent for musculoskeletal pain and therefore,      probably required more frequent narcotics than someone who was      narcotics naive.  Because of persistent pain and persistent      elevation of LFTs, Dr. Christella Hartigan pursued an ERCP and performed      sphincterotomy.  Following this procedure, he felt more      uncomfortable and was also complaining of testicular discomfort.      We got the second CT scan that did show some changes as noted      above.  However, there was no evidence for bile leak.  Within the      next 24-hours, the patient's abdominal discomfort was better.  The      testicular comfort was resolved and he was felt stable for      discharge.  He was eating a low-fat diet without problems.  2. Depression, anxiety.  The patient has a significant depression.  He      has a lot of vegetative symptoms and has no affect.  His wife      pretty much speaks for him when she is available and he is laconic      in answering any questions.   CONDITION ON DISCHARGE:  Stable and improved.   MEDICATIONS:  At discharge: 1. Geodon 120 mg at  bedtime.  2. Tramadol 100 mg three times a day.  3. Levoxyl 175 mcg once daily.  4. Xanax 1 mg three times daily.  5. Claritin 10 mg one time a day.  6. Remeron 15 mg at bedtime.  7. Flexeril 10 mg three times a day.  8. Ciprofloxacin 500 mg two times a day for 7 days.   The patient was advised to stop Naprosyn for the time being.  This this  potentially could be restarted by Dr. Arlyce Dice at the next office visit  which is January 9 at 3:00 p.m.  He is to go to the lab on October 18, 2006, for a comprehensive metabolic profile.      Jennye Moccasin, PA-C      Rachael Fee, MD  Electronically Signed    SG/MEDQ  D:  09/30/2006  T:  09/30/2006  Job:  [id number]

## 2011-02-26 NOTE — Consult Note (Signed)
NAME:  Hayden Rose, Hayden Rose NO.:  [id number]   MEDICAL RECORD NO.:  [id number]          PATIENT TYPE:  EMS   LOCATION:  MAJO                         FACILITY:  [facility]   PHYSICIAN:  Jordan Hawks. Elnoria Howard, MD    DATE OF BIRTH:  03-Sep-1951   DATE OF CONSULTATION:  10/04/2006  DATE OF DISCHARGE:                                 CONSULTATION   EMERGENCY ROOM GI CONSULTATION:   REASON FOR CONSULTATION:  Possible fever and worsening of his jaundice.   HISTORY OF PRESENT ILLNESS:  This is a 60 year old gentleman with a past  medical history of open cholecystectomy in October 2007 for gangrenous  cholecystitis, choledocholithiasis, biliary leak, severe depression and  history of chronic back pain who presents to the emergency room with  complaints of a low grade fever and worsening of his jaundice.  The  patient has had a very complicated course since his emergent  cholecystectomy in October 2007.  Since that time, he did develop a bile  leak, as well as choledocholithiasis, which had resulted in any  ascending cholangitis.  The patient subsequently underwent to ERCPs by  Dr. Arlyce Dice and Dr. Christella Hartigan with removal of the stones, as well as  placement of a biliary stent for treatment of the bile leak.  The  patient underwent stent removal by Dr. Christella Hartigan on September 28, 2006.  At  that time, the examination appeared to be normal.  A biliary  sphincterotomy was performed at that time, and there was no retention of  any stones or sludge.  The patient was recently discharged on September 30, 2006, and since that time he is still recuperating; however, the  patient's wife reports that last evening he had shaking chills, as well  as a low grade fever in the 99 range.  Subsequently, his temperature did  drop down to 96.  Because of his past medical and complaints, he was  subsequently brought into the emergency room for further evaluation.   PAST MEDICAL AND PAST SURGICAL HISTORY:  As stated  above.   FAMILY HISTORY:  Noncontributory.   SOCIAL HISTORY:  No tobacco use.   ALLERGIES:  1. LAMICTAL.  2. SULFA.  3. PENICILLIN.  4. OXYCONTIN.  5. Possible VICODIN.   REVIEW OF SYSTEMS:  Significant for weakness, fatigue.  No abdominal  pain, nausea, vomiting.  Questionable jaundice.  No diarrhea or  constipation.  No arthritis or arthralgias.  No dizziness or blurry  vision.  Positive for chills and low-grade fever.  No new skin rashes,  and no new neurologic.   PHYSICAL EXAMINATION:  VITAL SIGNS:  Blood pressure is 121/78, heart  rate 86, temperature is 97.5 and respirations 20.  GENERAL:  The patient is weak in appearance; however, he is alert and  oriented.  HEENT:  Normocephalic, atraumatic.  Extraocular muscles intact.  Pupils  equal, round and reactive to light.  NECK:  Supple.  No lymphadenopathy.  LUNGS:  Clear to auscultation bilaterally.  CARDIOVASCULAR:  Regular rate and rhythm without murmurs, gallops or  rubs.  ABDOMEN:  Flat, soft, nontender, nondistended.  There is a right upper  quadrant scar.  EXTREMITIES:  No clubbing, cyanosis or edema.   LABORATORY VALUES:  On October 04, 2006, white blood cell count is 5.6,  hemoglobin is 11.9, platelets at 250.  Sodium is 139, potassium 4.1,  chloride 104, CO2 of 27, glucose 86, BUN 15, creatinine 1.2, total  bilirubin is 1.4, alkaline phosphatase 446, AST 91, ALT is 154.  On  September 30, 2006, AST was noted to be at 170, ALT 213, alkaline  phosphatase 440, 66 and total bilirubin was at 4.2.  Overall, the  patient appears to be clinically stable.   IMPRESSION:  1. Abnormal liver enzymes.  2. History of choledocholithiasis.  3. History of gangrenous gallbladder.   After evaluation of the patient, he appears to be clinically stable at  this time, although he is weak in appearance.  I am uncertain about the  cause of the patient's complaints of chills.  He continues to be on  ciprofloxacin at this time twice  per day.  The laboratory values in  regards to his liver panel certainly reveal an improvement in his  transaminases.  His total bilirubin has also declined from 4.2 to 1.4  which is a positive improvement, and he is able to drain from a his  biliary tract.  I do not believe any endoscopic intervention is required  at this time.  The patient is to have a follow-up with his primary care  Neleh Muldoon tomorrow, and any changes can be evaluated at that time.  Certainly, the patient can call for any change in his symptoms, and the  patient is also to continue on his ciprofloxacin.      Jordan Hawks Elnoria Howard, MD  Electronically Signed     PDH/MEDQ  D:  10/04/2006  T:  10/04/2006  Job:  [id number]   cc:   Rachael Fee, MD  Barbette Hair. Arlyce Dice, MD,FACG

## 2011-02-26 NOTE — H&P (Signed)
NAME:  Hayden Rose, Hayden Rose NO.:  [id number]   MEDICAL RECORD NO.:  [id number]          PATIENT TYPE:  INP   LOCATION:  6703                         FACILITY:  [facility]   PHYSICIAN:  Clovis Pu. Cornett, M.D.DATE OF BIRTH:  02-01-1951   DATE OF ADMISSION:  07/20/2006  DATE OF DISCHARGE:                                HISTORY & PHYSICAL   CHIEF COMPLAINT:  Abdominal pain, chest pain.   HISTORY OF PRESENT ILLNESS:  Mr. Kronberg is a 60 year old male ten days out  from an open cholecystectomy due to gangrenous cholecystitis.  He has had  problems last three days with increasing right chest, right shoulder,  and  right upper quadrant pain.  He had a CT scan done two days ago which showed  no evidence of a myeloma or biliary ductal dilatation.  The liver function  was significant for an elevated alkaline phosphatase but normal bilirubin.  He had a white count of 15,000.  He had severe constipation which has been  treated; this with has helped some of his discomfort, but he continues now  to have right abdominal pain and right chest pain.  He comes in today for me  to reassess him.   PAST MEDICAL HISTORY:  1. Severe depression, on multiple anti depressive medications.  2. History of chronic back pain.  3. History of chronic neck pain.   PAST SURGICAL HISTORY:  1. Open cholecystectomy.  2. Three hand surgeries.  3. Appendectomy.  4. Tonsillectomy.   ALLERGIES:  LAMICTAL, SULFA, PENICILLIN, OXYTOCIN, OXYCONTIN for pain and  questionable VICODIN.   SOCIAL HISTORY:  Denies tobacco abuse.   FAMILY HISTORY:  Positive for heart disease and high blood pressure.   PHYSICAL EXAMINATION:  VITAL SIGNS:  Temperature 98, pulse 85.  GENERAL APPEARANCE:  White male in mild distress.  HEENT: No scleral icterus.  Oropharynx clear.  NECK:  Supple, nontender.  CHEST:  Clear to auscultation.  Chest wall motion is normal.  ABDOMEN:  Soft, nontender.  Right upper quadrant incision has healed  well  without signs of infection.  No rebound or guarding.  EXTREMITIES: No edema.  No calf tenderness bilaterally.   IMPRESSION:  Postoperative pain and dehydration after open cholecystectomy.   PLAN:  Will admit to hospital for evaluation.      Thomas A. Cornett, M.D.  Electronically Signed     TAC/MEDQ  D:  07/20/2006  T:  07/21/2006  Job:  [id number]

## 2011-02-26 NOTE — Assessment & Plan Note (Signed)
[facility]                         GASTROENTEROLOGY OFFICE NOTE   NAME:WILSONVadim, Centola                        MRN:          [id number]  DATE:01/31/2007                            DOB:          1951-07-10    PROBLEM:  Confusion.  Mr. Pella has returned for a scheduled GI  followup.  The family reports that he is not improved with lactulose.  He is having diarrhea.  Serologies for hepatitis B and C, AMA, ANA,  ceruloplasmin, and alpha-fetoprotein levels were normal.  Albumin  remains normal.  Latest set of LFTs pertinent for an AST and ALT in the  70 range.  Repeat ultrasound demonstrated a bile duct measuring 10 mm  and moderate increased echogenicity of the liver, suggestive of fatty  infiltration.  Repeat ammonia level was normal.   EXAM:  Pulse 100, blood pressure 110/72, weight 179.  He has minimal asterixis.  He is alert and oriented x3.   IMPRESSION:  Mild metabolic encephalopathy.  I think it is very unlikely  that he has chronic liver disease.  Abnormal liver tests are probably  due to mild hepatic steatosis.   RECOMMENDATIONS:  1. Discontinue lactulose.  2. Follow up with Dr. Orlin Hilding.  3. No further GI workup at this time.     Barbette Hair. Arlyce Dice, MD,FACG  Electronically Signed    RDK/MedQ  DD: 01/31/2007  DT: 01/31/2007  Job #: [id number]   cc:   Santina Evans A. Orlin Hilding, M.D.  Gloriajean Dell. Andrey Campanile, M.D.

## 2011-02-26 NOTE — Assessment & Plan Note (Signed)
[facility]                         GASTROENTEROLOGY OFFICE NOTE   NAME:Hayden Rose, Hayden Rose                        MRN:          [id number]  DATE:10/19/2006                            DOB:          1951/02/22    PROBLEM:  Abnormal liver tests.  [name] has returned for scheduled  GI followup.  In October, he underwent ERCP and stent placement for a  bile duct leak.  Followup ERCP on December 14 demonstrated no further  extravasation, but 2 bile duct stones were seen and removed.  He  subsequently was admitted with cholangitis.  A followup ERCP did not  demonstrate any stones.  A sphincterotomy was made.  Abnormal liver  tests were noted.  Since his discharge, though, he has felt well and has  been without fever or chills.   On December 28, alkaline phosphatase was 418 and total bilirubin was  1.3, and AST was 81.  On January 8, alkaline phosphatase was 292,  bilirubin 1.2, and AST is still 82.  [name] continues to feel well.   EXAM:  Pulse 95, blood pressure 90/60, weight 174.   IMPRESSION:  Cholangitis following ERCP and stone extraction - probably  secondary to edema at the sphincter of Oddi.  He currently has  persistently cholestatic liver tests, though they are improving.  There  is no evidence for retained stones.   RECOMMENDATIONS:  No further GI workup.  I will follow up his LFTs in 4  weeks and then I will continue if they remain abnormal.     Barbette Hair. Arlyce Dice, MD,FACG  Electronically Signed    RDK/MedQ  DD: 10/19/2006  DT: 10/19/2006  Job #: [id number]   cc:   Thomas A. Cornett, M.D.

## 2011-02-26 NOTE — Op Note (Signed)
NAME:  ATTILA, Hayden Rose                           ACCOUNT NO.:  [id number]   MEDICAL RECORD NO.:  [id number]                   PATIENT TYPE:  AMB   LOCATION:  DSC                                  FACILITY:  [facility]   PHYSICIAN:  Katy Fitch. Naaman Plummer., M.D.          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   PROCEDURES:  1. Diagnostic arthroscopy, right radiocarpal and ulnocarpal joint, with     debridement of dorsal scar, triangular fibrocartilage, and further     chondromalacia on ulnar aspect of lunate.  2. Open resection of distal ulnar utilizing a hemiresection arthroplasty     technique of Bowers.   SURGEON:  Katy Fitch. Sypher, M.D.   ASSISTANT:  Jonni Sanger, P.A.   ANESTHESIA:  Infraclavicular block supervised by the anesthesiologist, Quita Skye. Krista Blue, M.D.   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  Clinical  examination revealed evidence of chronic ulnocarpal abutment with a positive  ulnar variant and significant radiographic changes in the ulnar aspect of  the lunate.   He was brought to the operating room on 11/23/01, Hayden which time diagnostic  arthroscopy revealed significant signs of ulnocarpal abutment and a  triangular fibrocartilage degenerative predicament.   We completed arthroscopic debridement of his chondromalacia on the lunate  and debrided his triangular fibrocartilage, followed by a Feldon 3 mm distal  ulnar shortening.   In the immediate postoperative period he had good pain relief.  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   Serial x-rays revealed narrowing of the distal radioulnar joint and signs of  progressive arthrosis.   Due to a failure to relieve his pain with the limited  previous procedure, we  recommended proceeding with a formal hemiresection arthroplasty of the  distal ulna in the manner of Bowers.   After informed consent, he is brought to the operating room Hayden this time.   Preoperatively we advised him to proceed with a repeat diagnostic  arthroscopy to be certain that there were no other issues within the  radiocarpal and ulnocarpal articulation.   DESCRIPTION OF PROCEDURE:  The patient was brought to the operating room and  placed in supine position on the operating table.  Following infraclavicular  block in the holding area by Dr. Krista Blue, anesthesia was complete in the  right arm.   The arm was prepped with Betadine soap and solution and sterilely draped.  Ancef 1 g was administered as an IV prophylactic antibiotic.   Following exsanguination of  the right arm with an Esmarch bandage, an  arterial tourniquet was placed to 250 mmHg on the proximal brachium.   The procedure commenced with distraction of the patient's wrist with the aid  of a tower designed for wrist arthroscopy with index and long finger  fingertraps and counter traction the forearm.  Ten pounds of distraction was  applied.   The scope was introduced through a standard 3/4 dorsal portal after  distention of the joint with sterile saline utilizing an 18-gauge needle.  The scope was introduced with blunt technique, followed by diagnostic  arthroscopy.   There was considerable scar between the ulnar aspect of the lunate and the  triangular fibrocartilage Hayden the site of the previous debridement and  abrasion chondroplasty.  This obscured the view of the ulnocarpal  articulation.   A 6R portal was created and a 2.9 mm suction shaver was used to debride the  scar, further allowing visualization.   The triangular fibrocartilage was smoothed to a normal contour and free  fragments of cartilage on the lunate were debrided on the margins of the  chondromalacia noted previously.  The triangular fibrocartilage tear  previously noted had virtually healed, probably due to blood supply from the  exposed distal ulna.   After thorough debridement, photographic documentation of the chondromalacic  predicament was accomplished with the scope in the 6R portal.  The  arthroscopic equipment was removed, and attention was directed to the distal  ulnar resection.   An L-shaped incision was fashioned with the long limb paralleling the sixth  dorsal compartment.  Subcutaneous tissue was carefully divided, taking care  to retract the dorsal sensory branches of the ulnar nerve.  The retinaculum  was incised in the line of its fibers and released above the sixth dorsal  compartment to allow visualization of the ulnar head.  The capsule of the distal radioulnar joint had thickened to  approximately 3 mm and had  considerable synovitis.  A synovectomy was accomplished, followed by use of  an oscillating saw to remove the entire articular surface of the ulnar head,  preserving an ulnar and dorsal cortical bone column to support the ulnar  styloid.   There did not appear to be any risk of stylocarpal impingement.   Care was taken to protect the insertion of the triangular fibrocartilage  throughout dissection.   PA C-arm images were obtained documenting a very satisfactory resection of  the entire articular portion of the ulnar head.   This should relieve the symptoms from the distal radioulnar joint while  still preserving the stability of the triangular fibrocartilage origin and  sixth dorsal compartment.   The wounds were thoroughly  irrigated and debrided of all remaining bone  fragments and synovitis.   The capsule was then repaired with multiple individual sutures of 3-0  Ethibond with knots buried, followed by repair of the skin with intradermal  3-0 Prolene.   The patient was then placed in a sugar tong splint with the forearm in full  supination.   There were no apparent complications.                                               Katy Fitch Naaman Plummer., M.D.    RVS/MEDQ  D:  12/20/2002  T:  12/20/2002  Job:  [id number]

## 2011-02-26 NOTE — Op Note (Signed)
NAME:  Hayden Rose, Hayden Rose NO.:  [id number]   MEDICAL RECORD NO.:  [id number]          PATIENT TYPE:  INP   LOCATION:  0106                         FACILITY:  [facility]   PHYSICIAN:  Clovis Pu. Cornett, M.D.DATE OF BIRTH:  08-01-1951   DATE OF PROCEDURE:  07/09/2006  DATE OF DISCHARGE:                                 OPERATIVE REPORT   PREOPERATIVE DIAGNOSIS:  Acute cholecystitis.   POSTOP DIAGNOSIS:  Gangrenous cholecystitis.   PROCEDURE:  1. Laparoscopic converted to open cholecystectomy.  2. Intraoperative cholangiogram via cholecystodochotomy with a Reddick      catheter.   SURGEON:  Maisie Fus A. Davina Poke, M.D.   ASSISTANT:  Alfonse Ras, MD   ANESTHESIA:  General endotracheal anesthesia with 40 mL of 0.25% Sensorcaine  local.   DRAINS:  A 19 Blake drain to gallbladder fossa.   SPECIMEN:  Gallbladder with gallstones to pathology.   INTRAOPERATIVE FINDINGS:  Gangrenous gallbladder densely adherent to the  common bile duct.  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  Intraoperative cholangiogram  revealed a patent common bile duct, right and left hepatic ducts were well-  visualized and free flow of contrast on common duct was visualized.   INDICATIONS FOR PROCEDURE:  The patient is a 60 year old male with multiple  medical problems who presents with a 3-day history of abdominal pain; and  was seen by his primary care doctor and sent to the emergency room  yesterday.  He apparently had a thickened gallbladder wall and signs  consistent with acute cholecystitis.  He is brought to the operating room,  today, for a laparoscopic cholecystectomy and cholangiogram.  The patient  was consented for this procedure.   DESCRIPTION OF PROCEDURE:  The patient came to the operating room and was  placed supine.  After induction of general anesthesia, the abdomen was  prepped and draped in a sterile fashion.  A  1-cm supraumbilical incision was  made and dissection was carried down to his fascia; and the fascia was  grasped between two Kochers and opened with a scalpel.  I used a hemostat to  enter the abdominal cavity and placed my finger and swept around and felt no  adhesions.  A pursestring suture of #0 Vicryl was placed and a 12-mm Hassan  cannula was placed under direct vision.  A pneumoperitoneum was created to  15 mmHg with CO2 and a laparoscope was placed.  Laparoscopy was performed.  There was dense omental adhesions to the gallbladder.  There was stool in  the colon, but no small bowel dilatation.   Next an 11-mm subxiphoid port was placed under direct vision.  Two 5-mm  ports were placed in the right midabdomen both under direct vision.  There  is severe inflammatory change with the omentum being stuck to the  gallbladder.  The colon was stuck up there as well; but we were able to  bluntly to push this away without injuring it.  We were able then to better  visualize the gallbladder; and had to  decompress it with a decompressing  needle.  The dome was grasped.  It was significantly gangrenous.  We were  able to identify the infundibulum, and tried to grab this; but in the  process of grabbing this the gallbladder began to fragment.   At this point in time, we were able to identify what appeared to be the  cystic artery on the body of the gallbladder; and took this on the body of  the gallbladder.  There was dense inflammatory change around the  infundibulum, and the cystic duct area; and we made attempts to dissect  around this, but due to the friable nature of the gallbladder, we were  unable to grab it and hold it.  We attempted to do an intraoperative  cholangiogram through the cystic duct, but this appeared to be obstructed.  The anatomy was very difficult due to the woody inflammatory change around  the infundibulum and cystic duct; and concern for common bile duct injury in   trying to do so.  After spending about an hour doing this; we felt that  conversion to an open procedure was warranted colon and went ahead and did  so.  The CO2 was released; and all ports were subsequently removed and  passed off the field.   A right subcostal incision was then made; and dissection was carried down to  the subcutaneous tissues; and the abdominal wall was divided in layers.  A  Bookwalter retractor was used; and packs were used to pack away the colon  and duodenum; and packs were placed above the liver to help elevate the  liver toward [place].  We were able to identify the gallbladder; and then we  inspected the infundibulum cystic duct area.  Again, this was densely  scarred and the anatomy was quite unclear.  We then took the gallbladder in  a dome-down fashion away from the gallbladder bed.  Once we did this, the  anatomy still was not very clear.  We tried to cannulate what appeared to be  the cystic duct with a Reddick catheter, but were able to pass contrast  through it indicating that it was obstructed.  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  The infundibulum appeared to be  densely adhered to the common duct.   Attempts were made to cannulate through the end of the gallbladder, but we  were unsuccessful.  We were able to see the common duct, but I could not see  in its entire knee; and was concerned about this.  I elected to do a  cholangiogram via the common duct.  We were able to dissect out a segment of  the distal common duct.  Using an 11-blade a small 2-mm puncture wound was  made in the common duct.  We then placed a Reddick catheter and blew the  balloon up and used 1/2-strength Hypaque for intraoperative cholangiogram.  We initially injected with free-flow contrast up the common hepatic duct into the radicles, and the right-and-left hepatic duct.  I then  took some  solution out of the balloon and injected; and contrast then flowed around  the balloon, down the common duct, into the duodenum.  There is no evidence  of any leakage from the cystic duct remnant; nor was there any other injury  to the common duct that I could see; nor were there any common  duct stones  causing obstruction.   At this point in time, the catheter was removed and a single 4-0 PDS stitch  was placed to close the small puncture wound in the common duct without  narrowing the common duct.  At this point in time, I emptied the remainder  of the gallbladder and passed it off the field.  All loose stones that were  passed from the gallbladder were extracted from the right upper quadrant;  and irrigation was used; and copious amounts of this was used.  There is  significant oozing from this gallbladder bed, but it was controlled with  Surgicel.  The cystic duct remnant was completely obliterated; and I did not  close it, given the fact that there was no extravasation of contrast through  it during the intraoperative cholangiogram.  I elected to place an 18-Blake  drain in the gallbladder fossa, due to the amount of inflammation; as well  as having to cannulate the common duct directly.  This was done through one  of the previous port sites; and secured with a 3-0 nylon suture.   At this point all packs were removed, counted and found to be correct.  I  palpated the intra-abdominal cavity; and again could not feel any packs.  The colon and duodenum appeared uninjured, at this point in time, but they  were red and edematous from the localized inflammation.  Irrigation was  used, again, and suctioned out until clear.  The wound was closed using #1  PDS in running layers to close the abdominal wall in layers.  I then  injected the abdominal wall with 40 mL of 0.25% Sensorcaine for some local  anesthesia.  The port site was closed with the  pursestring suture of #0 Vicryl.   Staples were used to close all skin  incision after irrigating the wound.  Dry dressings were applied.  Drain was  placed to bulb suction.  All final counts of sponge, needle, and instruments  were found to be correct at this portion of the case x2.  The patient was  awoke, taken to recovery in satisfactory condition.      Thomas A. Cornett, M.D.  Electronically Signed     TAC/MEDQ  D:  07/09/2006  T:  07/11/2006  Job:  [id number]

## 2011-02-26 NOTE — Discharge Summary (Signed)
NAME:  Hayden Rose, Hayden Rose NO.:  [id number]   MEDICAL RECORD NO.:  [id number]          PATIENT TYPE:  INP   LOCATION:  1503                         FACILITY:  [facility]   PHYSICIAN:  Clovis Pu. Cornett, M.D.DATE OF BIRTH:  08-17-1951   DATE OF ADMISSION:  07/08/2006  DATE OF DISCHARGE:  07/13/2006                                 DISCHARGE SUMMARY   ADMISSION DIAGNOSIS:  Acute cholecystitis.   DISCHARGE DIAGNOSIS:  Gangrenous cholecystitis, status post open  cholecystectomy.   BRIEF HISTORY:  The patient is a 60 year old male admitted on July 08, 2006, with acute cholecystitis.  He was taken to the operating room on  July 09, 2006, for a laparoscopic converted to open cholecystectomy due  to gangrenous cholecystitis and Mirizzi syndrome.   HOSPITAL COURSE:  The patient's hospital course was relatively unremarkable.  He had a mild ileus postoperatively and a nasogastric tube was left in place  with a Foley catheter.  He had a JP, which drained some old bloody serous  fluid but no evidence of bile leakage.  His liver function studies were  essentially normal, and these returned to near-normal by postoperative day  #3.  His white count normalized, hemoglobin remained stable, and the  remainder of his electrolytes were stable.  His diet was slowly advanced  over the next two postoperative days.  He was passing flatus, and he had no  evidence of any problems with gastroparesis or nausea and vomiting.  He was  discharged home postoperative day 4 tolerating a diet, passing gas, being  able to void, and adequate pain control on oral analgesics.   DISCHARGE INSTRUCTIONS:  He will follow up next week to have his staples  removed.  I have told him to go ahead and shower.  His JP will be removed  today.  He will resume all of his home medications and be given a  prescription for Vicodin ES one tablet every 4-6 hours as needed for pain  and Cipro 500 mg p.o. b.i.d. for the  next 3-1/2 days.  He will refrain from  heavy lifting and driving.   CONDITION AT DISCHARGE:  Improved.      Thomas A. Cornett, M.D.  Electronically Signed     TAC/MEDQ  D:  07/13/2006  T:  07/14/2006  Job:  [id number]

## 2011-03-09 ENCOUNTER — Other Ambulatory Visit: Payer: Self-pay | Admitting: Psychiatry

## 2011-03-09 DIAGNOSIS — F0390 Unspecified dementia without behavioral disturbance: Secondary | ICD-10-CM

## 2011-03-12 ENCOUNTER — Other Ambulatory Visit: Payer: Medicare Other

## 2011-03-15 ENCOUNTER — Other Ambulatory Visit: Payer: Medicare Other

## 2011-03-19 ENCOUNTER — Other Ambulatory Visit: Payer: Self-pay | Admitting: Internal Medicine

## 2011-03-19 DIAGNOSIS — F0280 Dementia in other diseases classified elsewhere without behavioral disturbance: Secondary | ICD-10-CM

## 2011-03-24 ENCOUNTER — Inpatient Hospital Stay: Admission: RE | Admit: 2011-03-24 | Payer: Medicare Other | Source: Ambulatory Visit

## 2011-03-25 ENCOUNTER — Other Ambulatory Visit: Payer: Medicare Other

## 2011-03-26 ENCOUNTER — Ambulatory Visit
Admission: RE | Admit: 2011-03-26 | Discharge: 2011-03-26 | Disposition: A | Discharge status: Home / Self Care | Payer: Medicare Other | Source: Ambulatory Visit | Attending: Internal Medicine | Admitting: Internal Medicine

## 2011-03-26 DIAGNOSIS — G3183 Dementia with Lewy bodies: Secondary | ICD-10-CM

## 2011-03-26 DIAGNOSIS — F0280 Dementia in other diseases classified elsewhere without behavioral disturbance: Secondary | ICD-10-CM

## 2011-05-10 ENCOUNTER — Encounter: Payer: Self-pay | Admitting: Cardiovascular Disease

## 2011-09-30 ENCOUNTER — Telehealth: Payer: Self-pay | Admitting: Cardiovascular Disease

## 2011-09-30 NOTE — Telephone Encounter (Signed)
ROI received from [facility]/Dr.[name] Clent Ridges all Cardiac Records Were faxed to [phone]  09/30/11/KM

## 2011-12-08 ENCOUNTER — Other Ambulatory Visit (INDEPENDENT_AMBULATORY_CARE_PROVIDER_SITE_OTHER): Payer: Medicare Other

## 2011-12-08 ENCOUNTER — Encounter: Payer: Self-pay | Admitting: Gastroenterology

## 2011-12-08 ENCOUNTER — Ambulatory Visit (HOSPITAL_COMMUNITY)
Admission: RE | Admit: 2011-12-08 | Discharge: 2011-12-08 | Disposition: A | Discharge status: Home / Self Care | Payer: Medicare Other | Source: Ambulatory Visit | Attending: Gastroenterology | Admitting: Gastroenterology

## 2011-12-08 ENCOUNTER — Ambulatory Visit (INDEPENDENT_AMBULATORY_CARE_PROVIDER_SITE_OTHER): Payer: Medicare Other | Admitting: Gastroenterology

## 2011-12-08 VITALS — BP 110/70 | HR 88 | Ht 68.0 in | Wt 182.6 lb

## 2011-12-08 DIAGNOSIS — R7989 Other specified abnormal findings of blood chemistry: Secondary | ICD-10-CM | POA: Insufficient documentation

## 2011-12-08 DIAGNOSIS — R945 Abnormal results of liver function studies: Secondary | ICD-10-CM

## 2011-12-08 DIAGNOSIS — Z9089 Acquired absence of other organs: Secondary | ICD-10-CM | POA: Insufficient documentation

## 2011-12-08 LAB — CBC WITH DIFFERENTIAL/PLATELET
Basophils Absolute: 0.1 10*3/uL (ref 0.0–0.1)
Basophils Relative: 0.8 % (ref 0.0–3.0)
Eosinophils Absolute: 0.3 10*3/uL (ref 0.0–0.7)
Eosinophils Relative: 3.7 % (ref 0.0–5.0)
HCT: 42 % (ref 39.0–52.0)
Hemoglobin: 14.5 g/dL (ref 13.0–17.0)
Lymphocytes Relative: 24.5 % (ref 12.0–46.0)
Lymphs Abs: 1.8 10*3/uL (ref 0.7–4.0)
MCHC: 34.5 g/dL (ref 30.0–36.0)
MCV: 87.7 fl (ref 78.0–100.0)
Monocytes Absolute: 0.4 10*3/uL (ref 0.1–1.0)
Monocytes Relative: 6 % (ref 3.0–12.0)
Neutro Abs: 4.9 10*3/uL (ref 1.4–7.7)
Neutrophils Relative %: 65 % (ref 43.0–77.0)
Platelets: 215 10*3/uL (ref 150.0–400.0)
RBC: 4.79 Mil/uL (ref 4.22–5.81)
RDW: 12 % (ref 11.5–14.6)
WBC: 7.5 10*3/uL (ref 4.5–10.5)

## 2011-12-08 NOTE — Patient Instructions (Signed)
Your Ultrasound is scheduled for today You will go to the basement for labs today

## 2011-12-08 NOTE — Progress Notes (Signed)
History of Present Illness:  [name] is a 61-year-old white male with history of Louis body dementia referred at the request of Drs. Walsh and Weber for evaluation of abnormal liver tests. Recent lab work because of an elevated creatinine was pertinent for AST is 656 and a ALT  144. Alkaline phosphatase was normal.  There is no history of abdominal pain, blood transfusions or hepatitis. There has been no change in his medications. Recent creatinine of 1.7 was noted. Patient has a history of retained bile duct stones and post cholecystectomy bile duct leak treated with endoscopic sphincterotomy, stone extraction and stent placement.    Past Medical History  Diagnosis Date  . Skin cancer     basal cell on back x2  . Lewy body dementia   . Dyspnea   . Dizziness   . Orthostatic hypotension   . Anxiety   . Depression   . Hypothyroidism   . Osgood-Schlatter's disease   . Back pain     chronic  . Degenerative disc disease   . Arthritis    Past Surgical History  Procedure Date  . Gallbladder surgery 2007-2008  . Basal cell carcinoma excision 2008,2009  . Lumbar fusion 1996  . Cervical fusion 1985  . Carpal tunnel release 2003  . Tonsillectomy   . Appendectomy    family history includes Cirrhosis in his father and sister; Colitis in his daughter; Coronary artery disease in his mother; Crohn's disease in his mother; Diabetes in his brother and maternal uncle; Heart attack in his mother; Kidney disease in his cousin and maternal aunt; Liver cancer in his cousin; and Prostate cancer in his maternal uncle. Current Outpatient Prescriptions  Medication Sig Dispense Refill  . ALPRAZolam (XANAX) 1 MG tablet Take 1 mg by mouth 3 (three) times daily.        . cyclobenzaprine (FLEXERIL) 10 MG tablet Take 10 mg by mouth 3 (three) times daily as needed.      . escitalopram (LEXAPRO) 20 MG tablet Take 40 mg by mouth daily.        . HYDROcodone-acetaminophen (VICODIN) 5-500 MG per tablet Take 2  tablets by mouth 3 (three) times daily.      . levothyroxine (SYNTHROID, LEVOTHROID) 150 MCG tablet Take 150 mcg by mouth daily.      . loratadine (CLARITIN) 10 MG tablet Take 10 mg by mouth daily.      . Omega-3 Fatty Acids (FISH OIL) 1000 MG CAPS Take by mouth daily.       . omeprazole (PRILOSEC) 20 MG capsule Take 20 mg by mouth daily.        . ziprasidone (GEODON) 60 MG capsule Take 120 mg by mouth every evening.         Allergies as of 12/08/2011 - Review Complete 12/08/2011  Allergen Reaction Noted  . Aricept (donepezil hydrochloride)  12/08/2011  . Lomotil  12/08/2011  . Namenda (memantine hcl)  12/08/2011  . Oxycodone hcl  07/18/2009  . Penicillins  07/18/2009  . Sulfur  07/18/2009  . Vesicare (solifenacin succinate)  12/08/2011    reports that he has quit smoking. He has never used smokeless tobacco. He reports that he does not drink alcohol or use illicit drugs.     Review of Systems: He complains of chronic back pain, fatigue depression and headaches. He has sleeping problems and excess urination. Pertinent positive and negative review of systems were noted in the above HPI section. All other review of systems were otherwise negative.    Vital signs were reviewed in today's medical record Physical Exam: General: Well developed , well nourished, no acute distress Head: Normocephalic and atraumatic Eyes:  sclerae anicteric, EOMI Ears: Normal auditory acuity Mouth: No deformity or lesions Neck: Supple, no masses or thyromegaly Lungs: Clear throughout to auscultation Heart: Regular rate and rhythm; no murmurs, rubs or bruits Abdomen: Soft, non tender and non distended. No masses, hepatosplenomegaly or hernias noted. Normal Bowel sounds Rectal:deferred Musculoskeletal: Symmetrical with no gross deformities  Skin: No lesions on visible extremities Pulses:  Normal pulses noted Extremities: No clubbing, cyanosis, edema or deformities noted Neurological: Alert oriented x 4,  grossly nonfocal Cervical Nodes:  No significant cervical adenopathy Inguinal Nodes: No significant inguinal adenopathy Psychological:  Alert and cooperative. Normal mood and affect       

## 2011-12-08 NOTE — Assessment & Plan Note (Addendum)
Etiology of LFT abnormalities is not certain. There has been no change in medications. Patient has no pain.  A bile duct stricture is more likely to cause cholestatic liver tests than a necroinflammatory pattern, which the patient appears to have.    Recommendations #1 ultrasound #2 serologies for hepatitis A, B. and C.

## 2011-12-09 LAB — PTH, INTACT AND CALCIUM
Calcium, Total (PTH): 9.7 mg/dL (ref 8.4–10.5)
PTH: 65.8 pg/mL (ref 14.0–72.0)

## 2011-12-09 LAB — HEPATITIS A ANTIBODY, IGM: Hep A IgM: NEGATIVE

## 2011-12-09 LAB — HEPATITIS B SURFACE ANTIBODY,QUALITATIVE: Hep B S Ab: NEGATIVE

## 2011-12-09 LAB — HEPATITIS C ANTIBODY: HCV Ab: NEGATIVE

## 2011-12-09 LAB — HEPATITIS B SURFACE ANTIGEN: Hepatitis B Surface Ag: NEGATIVE

## 2011-12-10 ENCOUNTER — Telehealth: Payer: Self-pay

## 2011-12-10 ENCOUNTER — Telehealth: Payer: Self-pay | Admitting: Gastroenterology

## 2011-12-10 DIAGNOSIS — R7989 Other specified abnormal findings of blood chemistry: Secondary | ICD-10-CM

## 2011-12-10 LAB — UIFE/LIGHT CHAINS/TP QN, 24-HR UR
Albumin, U: DETECTED
Free Kappa Lt Chains,Ur: 10.4 mg/dL — ABNORMAL HIGH (ref 0.14–2.42)
Free Kappa/Lambda Ratio: 18.91 ratio — ABNORMAL HIGH (ref 2.04–10.37)
Free Lambda Lt Chains,Ur: 0.55 mg/dL (ref 0.02–0.67)
Total Protein, Urine: 12.6 mg/dL

## 2011-12-10 LAB — PROTEIN ELECTROPHORESIS, SERUM
Albumin ELP: 66.9 % — ABNORMAL HIGH (ref 55.8–66.1)
Alpha-1-Globulin: 4.2 % (ref 2.9–4.9)
Alpha-2-Globulin: 10.3 % (ref 7.1–11.8)
Beta 2: 3.5 % (ref 3.2–6.5)
Beta Globulin: 5.2 % (ref 4.7–7.2)
Gamma Globulin: 9.9 % — ABNORMAL LOW (ref 11.1–18.8)
Total Protein, Serum Electrophoresis: 7.1 g/dL (ref 6.0–8.3)

## 2011-12-10 LAB — HEPATITIS C RNA QUANTITATIVE: HCV Quantitative: NOT DETECTED IU/mL (ref <43)

## 2011-12-10 NOTE — Telephone Encounter (Signed)
Spoke with pts wife. She was requesting the results of the abdominal ultrasound. Reviewed ultrasound and let her know that there was nothing acute found on the ultrasound. Pts wife wants to know if the biliary dilatation is what is causing the elevated LFT's. Dr. Arlyce Dice please advise.

## 2011-12-13 ENCOUNTER — Other Ambulatory Visit (INDEPENDENT_AMBULATORY_CARE_PROVIDER_SITE_OTHER): Payer: Medicare Other

## 2011-12-13 DIAGNOSIS — R7989 Other specified abnormal findings of blood chemistry: Secondary | ICD-10-CM

## 2011-12-13 LAB — HEPATIC FUNCTION PANEL
ALT: 91 U/L — ABNORMAL HIGH (ref 0–53)
AST: 53 U/L — ABNORMAL HIGH (ref 0–37)
Albumin: 4.4 g/dL (ref 3.5–5.2)
Alkaline Phosphatase: 63 U/L (ref 39–117)
Bilirubin, Direct: 0 mg/dL (ref 0.0–0.3)
Total Bilirubin: 0.2 mg/dL — ABNORMAL LOW (ref 0.3–1.2)
Total Protein: 6.6 g/dL (ref 6.0–8.3)

## 2011-12-13 NOTE — Telephone Encounter (Signed)
See previous telephone note. 

## 2011-12-13 NOTE — Telephone Encounter (Signed)
Biliary dilitation is stable and seen previously.  This is common post cholecystectomy probably not clinically significant since it is an old finding while his LFTs were normal.  He needs f/u LFTs

## 2011-12-13 NOTE — Telephone Encounter (Signed)
Spoke with pt and he is aware. States he will come for labs today.

## 2011-12-15 ENCOUNTER — Telehealth: Payer: Self-pay | Admitting: Gastroenterology

## 2011-12-15 DIAGNOSIS — R7989 Other specified abnormal findings of blood chemistry: Secondary | ICD-10-CM

## 2011-12-15 NOTE — Telephone Encounter (Signed)
See note I sent to [facility]

## 2011-12-15 NOTE — Telephone Encounter (Signed)
Next step is to do a percutaneous liver biopsy. This should determine etiology for his liver test abnormalities. He needs an INR, iron, TIBC, ferritin, ceruloplasmin level and alpha-1 antitrypsin level

## 2011-12-15 NOTE — Telephone Encounter (Signed)
Do you want Interventional radiology to do the biopsy or does it need to be scheduled with you? Please advise.

## 2011-12-15 NOTE — Telephone Encounter (Signed)
Tachycardia is unrelated to LFT abnormalities. Is itching generalized or just around his eyes. Blood tests were inconclusive for a cause of LFT abn

## 2011-12-15 NOTE — Telephone Encounter (Signed)
Patient's wife calling to report patient had an episode of tachycardia last night. He has had a nuclear stress test and does have some heart issues but she is wondering if his abnormal LFT's are causing these episodes. He took a Xanax and this helped. She also reports he is itching and has swelling around eyes. Also, asking about lab results from 12/13/11. Please, advise.

## 2011-12-15 NOTE — Telephone Encounter (Signed)
Spoke with pts wife and the pt states he has itching on his legs and arms above his elbows. This has been going on for the past few days. She is planning to give him benadryl for the itching. States she will call the cardiologist for the tachycardia. Wants to know what is next? The pt does not have a follow-up OV. Please advise.

## 2011-12-16 ENCOUNTER — Other Ambulatory Visit: Payer: Self-pay | Admitting: Gastroenterology

## 2011-12-16 DIAGNOSIS — R7989 Other specified abnormal findings of blood chemistry: Secondary | ICD-10-CM

## 2011-12-16 NOTE — Telephone Encounter (Signed)
Pt scheduled for liver biopsy at [facility] 12/27/11 arrival time 6:30am for a 7:30am appt. Pt to be NPO after midnight. Wife aware of appt date and time.

## 2011-12-16 NOTE — Telephone Encounter (Signed)
Left message for wife to call back.

## 2011-12-16 NOTE — Telephone Encounter (Signed)
I will do it.

## 2011-12-16 NOTE — Telephone Encounter (Signed)
Addended by: Selinda Michaels R on: 12/16/2011 10:43 AM   Modules accepted: Orders

## 2011-12-16 NOTE — Telephone Encounter (Signed)
Spoke with wife and she is aware of Dr. Marzetta Board recommendations. Lab orders entered.

## 2011-12-17 ENCOUNTER — Other Ambulatory Visit (INDEPENDENT_AMBULATORY_CARE_PROVIDER_SITE_OTHER): Payer: Medicare Other

## 2011-12-17 ENCOUNTER — Other Ambulatory Visit: Payer: Self-pay | Admitting: *Deleted

## 2011-12-17 DIAGNOSIS — R945 Abnormal results of liver function studies: Secondary | ICD-10-CM

## 2011-12-17 DIAGNOSIS — R0602 Shortness of breath: Secondary | ICD-10-CM

## 2011-12-17 DIAGNOSIS — R7989 Other specified abnormal findings of blood chemistry: Secondary | ICD-10-CM

## 2011-12-17 LAB — PROTIME-INR
INR: 1 ratio (ref 0.8–1.0)
Prothrombin Time: 10.5 s (ref 10.2–12.4)

## 2011-12-17 LAB — FERRITIN: Ferritin: 174.2 ng/mL (ref 22.0–322.0)

## 2011-12-17 LAB — IBC PANEL
Iron: 71 ug/dL (ref 42–165)
Saturation Ratios: 20.4 % (ref 20.0–50.0)
Transferrin: 248.3 mg/dL (ref 212.0–360.0)

## 2011-12-21 LAB — CERULOPLASMIN: Ceruloplasmin: 27 mg/dL (ref 20–60)

## 2011-12-21 LAB — ALPHA-1-ANTITRYPSIN: A-1 Antitrypsin, Ser: 117 mg/dL (ref 90–200)

## 2011-12-27 ENCOUNTER — Encounter (HOSPITAL_COMMUNITY): Payer: Self-pay | Admitting: *Deleted

## 2011-12-27 ENCOUNTER — Other Ambulatory Visit: Payer: Self-pay | Admitting: Gastroenterology

## 2011-12-27 ENCOUNTER — Other Ambulatory Visit (HOSPITAL_COMMUNITY): Payer: Medicare Other

## 2011-12-27 ENCOUNTER — Encounter (HOSPITAL_COMMUNITY)
Admission: RE | Disposition: A | Discharge status: Home / Self Care | Payer: Self-pay | Source: Ambulatory Visit | Attending: Gastroenterology

## 2011-12-27 ENCOUNTER — Ambulatory Visit (HOSPITAL_COMMUNITY)
Admission: RE | Admit: 2011-12-27 | Discharge: 2011-12-27 | Disposition: A | Discharge status: Home / Self Care | Payer: Medicare Other | Source: Ambulatory Visit | Attending: Gastroenterology | Admitting: Gastroenterology

## 2011-12-27 ENCOUNTER — Telehealth: Payer: Self-pay | Admitting: Gastroenterology

## 2011-12-27 DIAGNOSIS — Z79899 Other long term (current) drug therapy: Secondary | ICD-10-CM | POA: Insufficient documentation

## 2011-12-27 DIAGNOSIS — F0280 Dementia in other diseases classified elsewhere without behavioral disturbance: Secondary | ICD-10-CM | POA: Insufficient documentation

## 2011-12-27 DIAGNOSIS — R7989 Other specified abnormal findings of blood chemistry: Secondary | ICD-10-CM

## 2011-12-27 DIAGNOSIS — R945 Abnormal results of liver function studies: Secondary | ICD-10-CM

## 2011-12-27 DIAGNOSIS — Z01818 Encounter for other preprocedural examination: Secondary | ICD-10-CM | POA: Insufficient documentation

## 2011-12-27 DIAGNOSIS — K7689 Other specified diseases of liver: Secondary | ICD-10-CM | POA: Insufficient documentation

## 2011-12-27 DIAGNOSIS — Z85828 Personal history of other malignant neoplasm of skin: Secondary | ICD-10-CM | POA: Insufficient documentation

## 2011-12-27 DIAGNOSIS — E039 Hypothyroidism, unspecified: Secondary | ICD-10-CM | POA: Insufficient documentation

## 2011-12-27 DIAGNOSIS — G3183 Dementia with Lewy bodies: Secondary | ICD-10-CM | POA: Insufficient documentation

## 2011-12-27 HISTORY — DX: Chronic kidney disease, unspecified: N18.9

## 2011-12-27 HISTORY — PX: LIVER BIOPSY: SHX301

## 2011-12-27 HISTORY — DX: Unspecified jaundice: R17

## 2011-12-27 HISTORY — DX: Other specified abnormal findings of blood chemistry: R79.89

## 2011-12-27 HISTORY — DX: Abnormal results of liver function studies: R94.5

## 2011-12-27 HISTORY — DX: Tachycardia, unspecified: R00.0

## 2011-12-27 SURGERY — BIOPSY, LIVER
Anesthesia: LOCAL | Wound class: Clean

## 2011-12-27 MED ORDER — FENTANYL CITRATE 0.05 MG/ML IJ SOLN
INTRAMUSCULAR | Status: DC | PRN
  Administered 2011-12-27 (×2): 25 ug via INTRAVENOUS

## 2011-12-27 MED ORDER — LIDOCAINE HCL 1 % IJ SOLN
INTRAMUSCULAR | Status: AC
  Filled 2011-12-27: qty 20

## 2011-12-27 MED ORDER — MIDAZOLAM HCL 10 MG/2ML IJ SOLN
INTRAMUSCULAR | Status: DC | PRN
  Administered 2011-12-27 (×2): 2 mg via INTRAVENOUS

## 2011-12-27 MED ORDER — FENTANYL CITRATE 0.05 MG/ML IJ SOLN
INTRAMUSCULAR | Status: AC
  Filled 2011-12-27: qty 2

## 2011-12-27 MED ORDER — MIDAZOLAM HCL 10 MG/2ML IJ SOLN
INTRAMUSCULAR | Status: AC
  Filled 2011-12-27: qty 2

## 2011-12-27 NOTE — Discharge Instructions (Signed)
Liver Biopsy Care After Refer to this sheet in the next few weeks. These discharge instructions provide you with general information on caring for yourself after you leave the hospital. Your caregiver may also give you specific instructions. Your treatment has been planned according to the most current medical practices available, but unavoidable complications sometimes occur. If you have any problems or questions after discharge, please call your caregiver. HOME CARE INSTRUCTIONS   You should rest for 1 to 2 days or as instructed.   If you go home the same day as your procedure (outpatient), have a responsible adult take you home and stay with you overnight.   Do not lift more than 5 pounds or play contact sports for 2 weeks.   Do not drive for 24 hours after this test.   Do not take medicine containing aspirin or drink alcohol for 1 week after this test.   Change bandages (dressings) as directed.   Only take over-the-counter or prescription medicines for pain, discomfort, or fever as directed by your caregiver.  OBTAINING YOUR TEST RESULTS Not all test results are available during your visit. If your test results are not back during the visit, make an appointment with your caregiver to find out the results. Do not assume everything is normal if you have not heard from your caregiver or the medical facility. It is important for you to follow up on all of your test results. SEEK MEDICAL CARE IF:   You have increased bleeding (more than a small spot) from the biopsy site.   You have redness, swelling, or increasing pain in the biopsy site.   You have an oral temperature above 102 F (38.9 C).  SEEK IMMEDIATE MEDICAL CARE IF:   You develop swelling or pain in the belly (abdomen).   You develop a rash.   You have difficulty breathing, feel short of breath, or feel faint.   You develop any reaction or side effects to medicines given.  MAKE SURE YOU:   Understand these instructions.    Will watch your condition.   Will get help right away if you are not doing well or get worse.  Document Released: 04/16/2005 Document Revised: 09/16/2011 Document Reviewed: 05/09/2008 [employer] Patient Information 2012 [employer]. 

## 2011-12-27 NOTE — Telephone Encounter (Signed)
Pt scheduled to see Dr. Arlyce Dice 01/12/12@9 :30am. Pts wife aware of appt date and time.

## 2011-12-27 NOTE — Op Note (Signed)
[facility] [address] [place], [place]  [address]  OPERATIVE PROCEDURE REPORT  PATIENT:  Hayden Rose, Hayden Rose  MR#:  [id number] BIRTHDATE:  Nov 22, 1950  GENDER  male  PHYSICIAN:  Barbette Hair. Arlyce Dice, MD ASSISTANT:  PROCEDURE DATE:  12/27/2011 PROCEDURE:  Percutaneous Liver Biopsy  INDICATIONS:  persistant elevation of liver function tests  MEDICATIONS:   These medications were titrated to patient response per physician's verbal order, Fentanyl 50 mcg IV, Versed 4 mg IV  DESCRIPTION OF PROCEDURE:  After the risks benefits and alternatives of the procedure were thoroughly explained, informed consent was obtained.  The patient was placed in the supine position with the right side of the abdomen exposed.  The l iver was identified with ultrasound an pointof percuaneou biopsy was marked   The area was sterilely prepped.  Under Betadine prep and local  asesthesia, a percutaneous liver biopsy was performed using the left lateral intercostal approach.  The [facility] Scientific easy core Biopsy system 15ga x 15cm was introduced and a liver biopsy specimen was obtained in the usual fashion.  COMPLICATIONS:  None  IMPRESSION: 1) S/p percutaneous liver biopsy  RECOMMENDATIONS: 1) Call office to schedule visit in 2 weeks  ______________________________ Barbette Hair. Arlyce Dice, MD  CC:  n. eSIGNED:   Barbette Hair. Sherrice Creekmore at 12/27/2011 08:52 AM  Arta Silence, [phone]

## 2011-12-27 NOTE — Interval H&P Note (Signed)
History and Physical Interval Note:  12/27/2011 8:26 AM  Hayden Rose  has presented today for surgery, with the diagnosis of Elevated LFTs [790.6]  The various methods of treatment have been discussed with the patient and family. After consideration of risks, benefits and other options for treatment, the patient has consented to  Procedure(s) (LRB): LIVER BIOPSY (N/A) as a surgical intervention .  The patients' history has been reviewed, patient examined, no change in status, stable for surgery.  I have reviewed the patients' chart and labs.  Questions were answered to the patient's satisfaction.   Pt scheduled for a percutaneous liver biopsy to determine cause of abnormal liver tests.   The recent H&P (dated *12/08/2011**) was reviewed, the patient was examined and there is no change in the patients condition since that H&P was completed.   [name]  12/27/2011, 8:27 AM    [name]

## 2011-12-27 NOTE — H&P (View-Only) (Signed)
History of Present Illness:  Hayden Rose is a 61 year old white male with history of Hayden Rose body dementia referred at the request of Drs. Clent Ridges and Weber for evaluation of abnormal liver tests. Recent lab work because of an elevated creatinine was pertinent for AST is 656 and a ALT  144. Alkaline phosphatase was normal.  There is no history of abdominal pain, blood transfusions or hepatitis. There has been no change in his medications. Recent creatinine of 1.7 was noted. Patient has a history of retained bile duct stones and post cholecystectomy bile duct leak treated with endoscopic sphincterotomy, stone extraction and stent placement.    Past Medical History  Diagnosis Date  . Skin cancer     basal cell on back x2  . Lewy body dementia   . Dyspnea   . Dizziness   . Orthostatic hypotension   . Anxiety   . Depression   . Hypothyroidism   . Osgood-Schlatter's disease   . Back pain     chronic  . Degenerative disc disease   . Arthritis    Past Surgical History  Procedure Date  . Gallbladder surgery 2007-2008  . Basal cell carcinoma excision [id number]  . Lumbar fusion 1996  . Cervical fusion 1985  . Carpal tunnel release 2003  . Tonsillectomy   . Appendectomy    family history includes Cirrhosis in his father and sister; Colitis in his daughter; Coronary artery disease in his mother; Crohn's disease in his mother; Diabetes in his brother and maternal uncle; Heart attack in his mother; Kidney disease in his cousin and maternal aunt; Liver cancer in his cousin; and Prostate cancer in his maternal uncle. Current Outpatient Prescriptions  Medication Sig Dispense Refill  . ALPRAZolam (XANAX) 1 MG tablet Take 1 mg by mouth 3 (three) times daily.        . cyclobenzaprine (FLEXERIL) 10 MG tablet Take 10 mg by mouth 3 (three) times daily as needed.      Marland Kitchen escitalopram (LEXAPRO) 20 MG tablet Take 40 mg by mouth daily.        Marland Kitchen HYDROcodone-acetaminophen (VICODIN) 5-500 MG per tablet Take 2  tablets by mouth 3 (three) times daily.      Marland Kitchen levothyroxine (SYNTHROID, LEVOTHROID) 150 MCG tablet Take 150 mcg by mouth daily.      Marland Kitchen loratadine (CLARITIN) 10 MG tablet Take 10 mg by mouth daily.      . Omega-3 Fatty Acids (FISH OIL) 1000 MG CAPS Take by mouth daily.       Marland Kitchen omeprazole (PRILOSEC) 20 MG capsule Take 20 mg by mouth daily.        . ziprasidone (GEODON) 60 MG capsule Take 120 mg by mouth every evening.         Allergies as of 12/08/2011 - Review Complete 12/08/2011  Allergen Reaction Noted  . Aricept (donepezil hydrochloride)  12/08/2011  . Lomotil  12/08/2011  . Namenda (memantine hcl)  12/08/2011  . Oxycodone hcl  07/18/2009  . Penicillins  07/18/2009  . Sulfur  07/18/2009  . Vesicare (solifenacin succinate)  12/08/2011    reports that he has quit smoking. He has never used smokeless tobacco. He reports that he does not drink alcohol or use illicit drugs.     Review of Systems: He complains of chronic back pain, fatigue depression and headaches. He has sleeping problems and excess urination. Pertinent positive and negative review of systems were noted in the above HPI section. All other review of systems were otherwise negative.  Vital signs were reviewed in today's medical record Physical Exam: General: Well developed , well nourished, no acute distress Head: Normocephalic and atraumatic Eyes:  sclerae anicteric, EOMI Ears: Normal auditory acuity Mouth: No deformity or lesions Neck: Supple, no masses or thyromegaly Lungs: Clear throughout to auscultation Heart: Regular rate and rhythm; no murmurs, rubs or bruits Abdomen: Soft, non tender and non distended. No masses, hepatosplenomegaly or hernias noted. Normal Bowel sounds Rectal:deferred Musculoskeletal: Symmetrical with no gross deformities  Skin: No lesions on visible extremities Pulses:  Normal pulses noted Extremities: No clubbing, cyanosis, edema or deformities noted Neurological: Alert oriented x 4,  grossly nonfocal Cervical Nodes:  No significant cervical adenopathy Inguinal Nodes: No significant inguinal adenopathy Psychological:  Alert and cooperative. Normal mood and affect

## 2011-12-28 ENCOUNTER — Encounter (HOSPITAL_COMMUNITY): Payer: Self-pay | Admitting: Gastroenterology

## 2012-01-03 ENCOUNTER — Telehealth: Payer: Self-pay | Admitting: Gastroenterology

## 2012-01-03 NOTE — Telephone Encounter (Signed)
I doubt this is related to his liver biopsy. If he continues not to feel well I would see his PCP first

## 2012-01-03 NOTE — Telephone Encounter (Signed)
Pts wife states that he had the liver biopsy last Monday and that on Friday he started going down hill. States that he was bad on Sat and Sun. Very lethargic, itching quite a bit and his fingers swell a bit and then go down. Wife states that he does have a little dementia but he seems to be more confused. She states he is a little better this am. Dr. Arlyce Dice please advise.

## 2012-01-03 NOTE — Telephone Encounter (Signed)
Pt's wife aware.

## 2012-01-12 ENCOUNTER — Ambulatory Visit (INDEPENDENT_AMBULATORY_CARE_PROVIDER_SITE_OTHER): Payer: Medicare Other | Admitting: Gastroenterology

## 2012-01-12 ENCOUNTER — Other Ambulatory Visit (INDEPENDENT_AMBULATORY_CARE_PROVIDER_SITE_OTHER): Payer: Medicare Other

## 2012-01-12 ENCOUNTER — Encounter: Payer: Self-pay | Admitting: Gastroenterology

## 2012-01-12 VITALS — BP 124/70 | HR 88 | Ht 68.0 in | Wt 192.2 lb

## 2012-01-12 DIAGNOSIS — R7989 Other specified abnormal findings of blood chemistry: Secondary | ICD-10-CM

## 2012-01-12 DIAGNOSIS — R945 Abnormal results of liver function studies: Secondary | ICD-10-CM

## 2012-01-12 LAB — AMMONIA: Ammonia: 38 umol/L — ABNORMAL HIGH (ref 11–35)

## 2012-01-12 NOTE — Assessment & Plan Note (Signed)
Liver biopsy demonstrated mild hepatic steatosis. Transaminases are now between one half and 2 times normal.  Etiology for his elevation of his ALT is not clear. Pruritus is noteworthy although I think it is unlikely that it is reflective of underlying liver disease.  Recommendations #1 check ammonia level

## 2012-01-12 NOTE — Patient Instructions (Signed)
Your physician has requested that you go to the basement for  lab work before leaving today.   

## 2012-01-12 NOTE — Progress Notes (Signed)
History of Present Illness:  [name] has returned following percutaneous liver biopsy.  Mild hepatic steatosis was seen. There no other changes including changes consistent with cirrhosis. He complains of pruritus.    Review of Systems: Pertinent positive and negative review of systems were noted in the above HPI section. All other review of systems were otherwise negative.     Current Medications, Allergies, Past Medical History, Past Surgical History, Family History and Social History were reviewed in [facility] electronic medical record  Vital signs were reviewed in today's medical record. Physical Exam: General: Well developed , well nourished, no acute distress

## 2012-01-12 NOTE — Progress Notes (Signed)
Quick Note:  Please inform the patient that lab work was normal and to continue current plan of action ______ 

## 2012-02-08 ENCOUNTER — Other Ambulatory Visit: Payer: Self-pay | Admitting: Neurosurgery

## 2012-02-08 DIAGNOSIS — M545 Low back pain, unspecified: Secondary | ICD-10-CM

## 2012-02-08 DIAGNOSIS — IMO0002 Reserved for concepts with insufficient information to code with codable children: Secondary | ICD-10-CM

## 2012-02-08 DIAGNOSIS — M47817 Spondylosis without myelopathy or radiculopathy, lumbosacral region: Secondary | ICD-10-CM

## 2012-02-08 DIAGNOSIS — M5137 Other intervertebral disc degeneration, lumbosacral region: Secondary | ICD-10-CM

## 2012-02-12 ENCOUNTER — Ambulatory Visit
Admission: RE | Admit: 2012-02-12 | Discharge: 2012-02-12 | Disposition: A | Discharge status: Home / Self Care | Payer: Medicare Other | Source: Ambulatory Visit | Attending: Neurosurgery | Admitting: Neurosurgery

## 2012-02-12 DIAGNOSIS — IMO0002 Reserved for concepts with insufficient information to code with codable children: Secondary | ICD-10-CM

## 2012-02-12 DIAGNOSIS — M5137 Other intervertebral disc degeneration, lumbosacral region: Secondary | ICD-10-CM

## 2012-02-12 DIAGNOSIS — M545 Low back pain, unspecified: Secondary | ICD-10-CM

## 2012-02-12 DIAGNOSIS — M47817 Spondylosis without myelopathy or radiculopathy, lumbosacral region: Secondary | ICD-10-CM

## 2012-02-12 MED ORDER — GADOBENATE DIMEGLUMINE 529 MG/ML IV SOLN
17.0000 mL | Freq: Once | INTRAVENOUS | Status: AC | PRN
  Administered 2012-02-12: 17 mL via INTRAVENOUS

## 2012-07-19 ENCOUNTER — Encounter: Payer: Self-pay | Admitting: Cardiovascular Disease

## 2012-10-07 ENCOUNTER — Emergency Department (HOSPITAL_COMMUNITY): Payer: Medicare Other

## 2012-10-07 ENCOUNTER — Emergency Department (HOSPITAL_COMMUNITY)
Admission: EM | Admit: 2012-10-07 | Discharge: 2012-10-07 | Disposition: A | Discharge status: Home / Self Care | Payer: Medicare Other | Attending: Emergency Medicine | Admitting: Emergency Medicine

## 2012-10-07 ENCOUNTER — Encounter (HOSPITAL_COMMUNITY): Payer: Self-pay | Admitting: *Deleted

## 2012-10-07 DIAGNOSIS — Z79899 Other long term (current) drug therapy: Secondary | ICD-10-CM | POA: Insufficient documentation

## 2012-10-07 DIAGNOSIS — M549 Dorsalgia, unspecified: Secondary | ICD-10-CM | POA: Insufficient documentation

## 2012-10-07 DIAGNOSIS — R51 Headache: Secondary | ICD-10-CM | POA: Insufficient documentation

## 2012-10-07 DIAGNOSIS — Z8739 Personal history of other diseases of the musculoskeletal system and connective tissue: Secondary | ICD-10-CM | POA: Insufficient documentation

## 2012-10-07 DIAGNOSIS — Z862 Personal history of diseases of the blood and blood-forming organs and certain disorders involving the immune mechanism: Secondary | ICD-10-CM | POA: Insufficient documentation

## 2012-10-07 DIAGNOSIS — Z8679 Personal history of other diseases of the circulatory system: Secondary | ICD-10-CM | POA: Insufficient documentation

## 2012-10-07 DIAGNOSIS — Z85828 Personal history of other malignant neoplasm of skin: Secondary | ICD-10-CM | POA: Insufficient documentation

## 2012-10-07 DIAGNOSIS — R29818 Other symptoms and signs involving the nervous system: Secondary | ICD-10-CM | POA: Insufficient documentation

## 2012-10-07 DIAGNOSIS — G479 Sleep disorder, unspecified: Secondary | ICD-10-CM | POA: Insufficient documentation

## 2012-10-07 DIAGNOSIS — Z981 Arthrodesis status: Secondary | ICD-10-CM | POA: Insufficient documentation

## 2012-10-07 DIAGNOSIS — IMO0002 Reserved for concepts with insufficient information to code with codable children: Secondary | ICD-10-CM | POA: Insufficient documentation

## 2012-10-07 DIAGNOSIS — F3289 Other specified depressive episodes: Secondary | ICD-10-CM | POA: Insufficient documentation

## 2012-10-07 DIAGNOSIS — E039 Hypothyroidism, unspecified: Secondary | ICD-10-CM | POA: Insufficient documentation

## 2012-10-07 DIAGNOSIS — Z87891 Personal history of nicotine dependence: Secondary | ICD-10-CM | POA: Insufficient documentation

## 2012-10-07 DIAGNOSIS — N183 Chronic kidney disease, stage 3 unspecified: Secondary | ICD-10-CM | POA: Insufficient documentation

## 2012-10-07 DIAGNOSIS — G8929 Other chronic pain: Secondary | ICD-10-CM | POA: Insufficient documentation

## 2012-10-07 DIAGNOSIS — F411 Generalized anxiety disorder: Secondary | ICD-10-CM | POA: Insufficient documentation

## 2012-10-07 DIAGNOSIS — F329 Major depressive disorder, single episode, unspecified: Secondary | ICD-10-CM | POA: Insufficient documentation

## 2012-10-07 DIAGNOSIS — F0280 Dementia in other diseases classified elsewhere without behavioral disturbance: Secondary | ICD-10-CM | POA: Insufficient documentation

## 2012-10-07 DIAGNOSIS — R4589 Other symptoms and signs involving emotional state: Secondary | ICD-10-CM | POA: Insufficient documentation

## 2012-10-07 DIAGNOSIS — R451 Restlessness and agitation: Secondary | ICD-10-CM

## 2012-10-07 LAB — COMPREHENSIVE METABOLIC PANEL
ALT: 71 U/L — ABNORMAL HIGH (ref 0–53)
AST: 51 U/L — ABNORMAL HIGH (ref 0–37)
Albumin: 3.9 g/dL (ref 3.5–5.2)
Alkaline Phosphatase: 81 U/L (ref 39–117)
BUN: 17 mg/dL (ref 6–23)
CO2: 31 mEq/L (ref 19–32)
Calcium: 9.8 mg/dL (ref 8.4–10.5)
Chloride: 99 mEq/L (ref 96–112)
Creatinine, Ser: 1.48 mg/dL — ABNORMAL HIGH (ref 0.50–1.35)
GFR calc Af Amer: 58 mL/min — ABNORMAL LOW (ref >90)
GFR calc non Af Amer: 50 mL/min — ABNORMAL LOW (ref >90)
Glucose, Bld: 106 mg/dL — ABNORMAL HIGH (ref 70–99)
Potassium: 4.5 mEq/L (ref 3.5–5.1)
Sodium: 139 mEq/L (ref 135–145)
Total Bilirubin: 0.5 mg/dL (ref 0.3–1.2)
Total Protein: 7.1 g/dL (ref 6.0–8.3)

## 2012-10-07 LAB — RAPID URINE DRUG SCREEN, HOSP PERFORMED
Amphetamines: NOT DETECTED
Barbiturates: NOT DETECTED
Benzodiazepines: POSITIVE — AB
Cocaine: NOT DETECTED
Opiates: POSITIVE — AB
Tetrahydrocannabinol: NOT DETECTED

## 2012-10-07 LAB — URINALYSIS, ROUTINE W REFLEX MICROSCOPIC
Bilirubin Urine: NEGATIVE
Glucose, UA: NEGATIVE mg/dL
Ketones, ur: NEGATIVE mg/dL
Leukocytes, UA: NEGATIVE
Nitrite: NEGATIVE
Protein, ur: NEGATIVE mg/dL
Specific Gravity, Urine: 1.013 (ref 1.005–1.030)
Urobilinogen, UA: 1 mg/dL (ref 0.0–1.0)
pH: 8 (ref 5.0–8.0)

## 2012-10-07 LAB — CBC WITH DIFFERENTIAL/PLATELET
Basophils Absolute: 0.1 10*3/uL (ref 0.0–0.1)
Basophils Relative: 1 % (ref 0–1)
Eosinophils Absolute: 0.2 10*3/uL (ref 0.0–0.7)
Eosinophils Relative: 3 % (ref 0–5)
HCT: 37.5 % — ABNORMAL LOW (ref 39.0–52.0)
Hemoglobin: 13 g/dL (ref 13.0–17.0)
Lymphocytes Relative: 29 % (ref 12–46)
Lymphs Abs: 1.6 10*3/uL (ref 0.7–4.0)
MCH: 28.8 pg (ref 26.0–34.0)
MCHC: 34.7 g/dL (ref 30.0–36.0)
MCV: 83 fL (ref 78.0–100.0)
Monocytes Absolute: 0.5 10*3/uL (ref 0.1–1.0)
Monocytes Relative: 9 % (ref 3–12)
Neutro Abs: 3.2 10*3/uL (ref 1.7–7.7)
Neutrophils Relative %: 59 % (ref 43–77)
Platelets: 140 10*3/uL — ABNORMAL LOW (ref 150–400)
RBC: 4.52 MIL/uL (ref 4.22–5.81)
RDW: 12.5 % (ref 11.5–15.5)
WBC: 5.4 10*3/uL (ref 4.0–10.5)

## 2012-10-07 LAB — URINE MICROSCOPIC-ADD ON

## 2012-10-07 LAB — GLUCOSE, CAPILLARY: Glucose-Capillary: 102 mg/dL — ABNORMAL HIGH (ref 70–99)

## 2012-10-07 MED ORDER — MORPHINE SULFATE 4 MG/ML IJ SOLN
6.0000 mg | Freq: Once | INTRAMUSCULAR | Status: DC
  Filled 2012-10-07: qty 2

## 2012-10-07 MED ORDER — FENTANYL CITRATE 0.05 MG/ML IJ SOLN
100.0000 ug | Freq: Once | INTRAMUSCULAR | Status: AC
  Administered 2012-10-07: 100 ug via NASAL
  Filled 2012-10-07: qty 2

## 2012-10-07 NOTE — ED Provider Notes (Signed)
History     CSN: [id number]  Arrival date & time 10/07/12  4098   First MD Initiated Contact with Patient 10/07/12 1031      Chief Complaint  Patient presents with  . Altered Mental Status    (Consider location/radiation/quality/duration/timing/severity/associated sxs/prior treatment) Patient is a 61 y.o. male presenting with altered mental status. The history is provided by the patient.  Altered Mental Status This is a new problem. The current episode started in the past 7 days. Pertinent negatives include no abdominal pain, chest pain, coughing, fever, nausea, neck pain or vomiting. Associated symptoms comments: The patient's wife reports that recently (x 4 days) the patient has been having increased number and intensity of nightmares thought secondary to some of his medications. Three nights ago, he was having an episode of violent shaking and jerking of arms and legs while unarousable even with sternal rub. There was urinary incontinence. No history of seizures. There did not seem to be any post-ictal period as he was fully awake soon after the event. Since that time he has had periods of confusion and disorientation, the worst was today. He feels "out of it" and complains of headache. No vomiting, fever, breathing difficulties or other complaints..    Past Medical History  Diagnosis Date  . Skin cancer     basal cell on back x2  . Lewy body dementia   . Dyspnea   . Dizziness   . Orthostatic hypotension   . Anxiety   . Depression   . Hypothyroidism   . Osgood-Schlatter's disease   . Back pain     chronic  . Degenerative disc disease   . Arthritis   . Tachycardia     heart monitor last week for 48 hours no report yet  . Jaundice with stoppage of bile flow     after stent romoved  . Elevated LFTs   . Chronic kidney disease     stage III  renal disease per wife    Past Surgical History  Procedure Date  . Gallbladder surgery 2007-2008  . Basal cell carcinoma excision  [id number]  . Lumbar fusion 1996  . Cervical fusion 1985  . Carpal tunnel release 2003  . Tonsillectomy   . Appendectomy   . Cholecystectomy   . Liver biopsy 12/27/2011    Procedure: LIVER BIOPSY;  Surgeon: Louis Meckel, MD;  Location: [facility];  Service: Endoscopy;  Laterality: N/A;  pt moved from 3/22 to 3/18 ( aw)    Family History  Problem Relation Age of Onset  . Coronary artery disease Mother   . Heart attack Mother     stents in neck and heart ?  . Cirrhosis Sister   . Cirrhosis Father   . Prostate cancer Maternal Uncle   . Liver cancer Cousin   . Crohn's disease Mother   . Colitis Daughter   . Diabetes Maternal Uncle   . Diabetes Brother   . Kidney disease Maternal Aunt   . Kidney disease Cousin     History  Substance Use Topics  . Smoking status: Former Smoker    Quit date: 12/27/1975  . Smokeless tobacco: Never Used  . Alcohol Use: No      Review of Systems  Constitutional: Negative for fever.  HENT: Negative for neck pain.   Eyes: Negative for visual disturbance.  Respiratory: Negative for cough and shortness of breath.   Cardiovascular: Negative for chest pain and leg swelling.  Gastrointestinal: Negative for nausea, vomiting and  abdominal pain.  Genitourinary: Negative for dysuria.  Musculoskeletal: Negative for back pain.  Skin: Negative for wound.  Neurological:       See HPi.  Psychiatric/Behavioral: Positive for sleep disturbance and altered mental status.    Allergies  Vesicare; Aricept; Lamictal; Namenda; Oxycodone hcl; Sulfur; and Penicillins  Home Medications   Current Outpatient Rx  Name  Route  Sig  Dispense  Refill  . ALPRAZOLAM 1 MG PO TABS   Oral   Take 1 mg by mouth 3 (three) times daily.          . CYCLOBENZAPRINE HCL 10 MG PO TABS   Oral   Take 10 mg by mouth 3 (three) times daily.          . DONEPEZIL HCL 10 MG PO TABS   Oral   Take 10 mg by mouth daily.         Marland Kitchen ESCITALOPRAM OXALATE 20 MG PO TABS    Oral   Take 20 mg by mouth daily.          . OMEGA-3 FATTY ACIDS 1000 MG PO CAPS   Oral   Take 1 g by mouth daily.         Marland Kitchen LEVOTHYROXINE SODIUM 175 MCG PO TABS   Oral   Take 175 mcg by mouth daily.         Marland Kitchen LORATADINE 10 MG PO TABS   Oral   Take 10 mg by mouth daily.         . MORPHINE SULFATE 15 MG PO TABS   Oral   Take 45 mg by mouth 3 (three) times daily.         Marland Kitchen OMEPRAZOLE 20 MG PO CPDR   Oral   Take 20 mg by mouth daily.          Marland Kitchen RIVASTIGMINE 9.5 MG/24HR TD PT24   Transdermal   Place 1 patch onto the skin daily.         Marland Kitchen ZIPRASIDONE HCL 60 MG PO CAPS   Oral   Take 120 mg by mouth every evening.            BP 126/80  Pulse 78  Temp 97.5 F (36.4 C) (Oral)  Resp 12  Ht 5' 7.5" (1.715 m)  Wt 198 lb (89.812 kg)  BMI 30.55 kg/m2  SpO2 97%  Physical Exam  Constitutional: He is oriented to person, place, and time. He appears well-developed and well-nourished.  HENT:  Head: Normocephalic and atraumatic.  Eyes: EOM are normal. Pupils are equal, round, and reactive to light.  Neck: Normal range of motion.  Cardiovascular: Normal rate and regular rhythm.   No murmur heard. Pulmonary/Chest: Effort normal and breath sounds normal. He has no wheezes. He has no rales.  Abdominal: Soft. There is no tenderness. There is no rebound and no guarding.  Musculoskeletal: Normal range of motion. He exhibits no edema.  Neurological: He is alert and oriented to person, place, and time. He has normal strength and normal reflexes. No sensory deficit. He displays a negative Romberg sign. Coordination normal.  Skin: Skin is warm.  Psychiatric: He has a normal mood and affect.    ED Course  Procedures (including critical care time)  Labs Reviewed  CBC WITH DIFFERENTIAL - Abnormal; Notable for the following:    HCT 37.5 (*)     Platelets 140 (*)     All other components within normal limits  COMPREHENSIVE METABOLIC PANEL - Abnormal; Notable  for the  following:    Glucose, Bld 106 (*)     Creatinine, Ser 1.48 (*)     AST 51 (*)     ALT 71 (*)     GFR calc non Af Amer 50 (*)     GFR calc Af Amer 58 (*)     All other components within normal limits  URINE RAPID DRUG SCREEN (HOSP PERFORMED) - Abnormal; Notable for the following:    Opiates POSITIVE (*)     Benzodiazepines POSITIVE (*)     All other components within normal limits  GLUCOSE, CAPILLARY - Abnormal; Notable for the following:    Glucose-Capillary 102 (*)     All other components within normal limits  URINALYSIS, ROUTINE W REFLEX MICROSCOPIC - Abnormal; Notable for the following:    Hgb urine dipstick SMALL (*)     All other components within normal limits  URINE MICROSCOPIC-ADD ON   Dg Chest 2 View  10/07/2012  *RADIOLOGY REPORT*  Clinical Data: Altered mental status.  CHEST - 2 VIEW  Comparison: 12/15/2007  Findings: Low lung volumes. Heart and mediastinal contours are within normal limits.  No focal opacities or effusions.  No acute bony abnormality.  IMPRESSION: No active cardiopulmonary disease.   Original Report Authenticated By: Charlett Nose, M.D.    Ct Head Wo Contrast  10/07/2012  *RADIOLOGY REPORT*  Clinical Data: Altered mental status.  CT HEAD WITHOUT CONTRAST  Technique:  Contiguous axial images were obtained from the base of the skull through the vertex without contrast.  Comparison: 03/26/2011  Findings: No acute intracranial abnormality.  Specifically, no hemorrhage, hydrocephalus, mass lesion, acute infarction, or significant intracranial injury.  No acute calvarial abnormality. Visualized paranasal sinuses and mastoids clear.  Orbital soft tissues unremarkable.  IMPRESSION: No acute intracranial abnormality.   Original Report Authenticated By: Charlett Nose, M.D.      No diagnosis found. 1. Agitation 2. Possible new onset seizure   MDM  Co-evaluated by Dr. Fonnie Jarvis. He remains alert, calm. Complains of back and headache -d ue for regular pain medications.  Discussed with neurology - no further evaluation needed inpatient - recommendation is for outpatient EEG and neurology evaluation. Dr. Fonnie Jarvis discussed with family, all questions answered. Patient to be discharged home.        Rodena Medin, PA-C 10/07/12 1516

## 2012-10-07 NOTE — ED Notes (Signed)
Pt dx with Lewy Body Dementia 2008

## 2012-10-07 NOTE — ED Notes (Signed)
Patient reports having night mares recently.  Today he feels confused, thought the world was coming to an end.  Patient admits to being in the service.  He states he cannot recall what the dream was about.  He states it was catastrophic.  Patient states his head is running, ringing, and he has tension headache.

## 2012-10-07 NOTE — ED Notes (Signed)
Dr advised would change order to fenynl so not have to start IV.  Plan is to discharge.

## 2012-10-12 ENCOUNTER — Other Ambulatory Visit: Payer: Self-pay | Admitting: Neurology

## 2012-10-12 DIAGNOSIS — G40309 Generalized idiopathic epilepsy and epileptic syndromes, not intractable, without status epilepticus: Secondary | ICD-10-CM

## 2012-10-12 DIAGNOSIS — R269 Unspecified abnormalities of gait and mobility: Secondary | ICD-10-CM

## 2012-10-12 DIAGNOSIS — F419 Anxiety disorder, unspecified: Secondary | ICD-10-CM

## 2012-10-12 DIAGNOSIS — F0280 Dementia in other diseases classified elsewhere without behavioral disturbance: Secondary | ICD-10-CM

## 2012-10-13 ENCOUNTER — Ambulatory Visit
Admission: RE | Admit: 2012-10-13 | Discharge: 2012-10-13 | Disposition: A | Discharge status: Home / Self Care | Payer: Medicare Other | Source: Ambulatory Visit | Attending: Neurology | Admitting: Neurology

## 2012-10-13 DIAGNOSIS — F419 Anxiety disorder, unspecified: Secondary | ICD-10-CM

## 2012-10-13 DIAGNOSIS — G3183 Dementia with Lewy bodies: Secondary | ICD-10-CM

## 2012-10-13 DIAGNOSIS — G40309 Generalized idiopathic epilepsy and epileptic syndromes, not intractable, without status epilepticus: Secondary | ICD-10-CM

## 2012-10-13 DIAGNOSIS — R269 Unspecified abnormalities of gait and mobility: Secondary | ICD-10-CM

## 2012-10-13 DIAGNOSIS — F0280 Dementia in other diseases classified elsewhere without behavioral disturbance: Secondary | ICD-10-CM

## 2012-10-13 MED ORDER — GADOBENATE DIMEGLUMINE 529 MG/ML IV SOLN
18.0000 mL | Freq: Once | INTRAVENOUS | Status: AC | PRN
  Administered 2012-10-13: 18 mL via INTRAVENOUS

## 2012-11-25 ENCOUNTER — Other Ambulatory Visit: Payer: Self-pay

## 2012-12-08 ENCOUNTER — Encounter: Payer: Self-pay | Admitting: Neurology

## 2012-12-10 ENCOUNTER — Emergency Department (HOSPITAL_COMMUNITY)
Admission: EM | Admit: 2012-12-10 | Discharge: 2012-12-10 | Disposition: A | Discharge status: Home / Self Care | Payer: Medicare Other | Source: Home / Self Care | Attending: Family Medicine | Admitting: Family Medicine

## 2012-12-10 ENCOUNTER — Encounter (HOSPITAL_COMMUNITY): Payer: Self-pay | Admitting: Emergency Medicine

## 2012-12-10 DIAGNOSIS — H9319 Tinnitus, unspecified ear: Secondary | ICD-10-CM

## 2012-12-10 DIAGNOSIS — H9313 Tinnitus, bilateral: Secondary | ICD-10-CM

## 2012-12-10 MED ORDER — AMOXICILLIN 500 MG PO CAPS: 500.0000 mg | ORAL_CAPSULE | Freq: Three times a day (TID) | ORAL | Status: DC

## 2012-12-10 NOTE — ED Provider Notes (Signed)
History     CSN: [id number]  Arrival date & time 12/10/12  1100   First MD Initiated Contact with Patient 12/10/12 1102      Chief Complaint  Patient presents with  . Tinnitus    ringing in both ears x several wks. left ear sound is muffled. x the past couple days.     (Consider location/radiation/quality/duration/timing/severity/associated sxs/prior treatment) Patient is a 62 y.o. male presenting with neurologic complaint. The history is provided by the patient and the spouse.  Neurologic Problem Primary symptoms do not include headaches, loss of consciousness, dizziness, fever, nausea or vomiting. Primary symptoms comment: chronic tinnitus, today had hearing loss in left ear.. The symptoms began 2 to 6 hours ago. The symptoms are unchanged. The neurological symptoms are focal.  Additional symptoms include hearing loss. Additional symptoms do not include pain, loss of balance or nystagmus. Medical issues also include seizures. Associated medical issues comments: complex medical hx..    Past Medical History  Diagnosis Date  . Skin cancer     basal cell on back x2  . Lewy body dementia   . Dyspnea   . Dizziness   . Orthostatic hypotension   . Anxiety   . Depression   . Hypothyroidism   . Osgood-Schlatter's disease   . Back pain     chronic  . Degenerative disc disease   . Arthritis   . Tachycardia     heart monitor last week for 48 hours no report yet  . Jaundice with stoppage of bile flow     after stent romoved  . Elevated LFTs   . Chronic kidney disease     stage III  renal disease per wife    Past Surgical History  Procedure Laterality Date  . Gallbladder surgery  2007-2008  . Basal cell carcinoma excision  [id number]  . Lumbar fusion  1996  . Cervical fusion  1985  . Carpal tunnel release  2003  . Tonsillectomy    . Appendectomy    . Cholecystectomy    . Liver biopsy  12/27/2011    Procedure: LIVER BIOPSY;  Surgeon: Louis Meckel, MD;  Location: [facility];  Service: Endoscopy;  Laterality: N/A;  pt moved from 3/22 to 3/18 ( aw)    Family History  Problem Relation Age of Onset  . Coronary artery disease Mother   . Heart attack Mother     stents in neck and heart ?  . Cirrhosis Sister   . Cirrhosis Father   . Prostate cancer Maternal Uncle   . Liver cancer Cousin   . Crohn's disease Mother   . Colitis Daughter   . Diabetes Maternal Uncle   . Diabetes Brother   . Kidney disease Maternal Aunt   . Kidney disease Cousin     History  Substance Use Topics  . Smoking status: Former Smoker    Quit date: 12/27/1975  . Smokeless tobacco: Never Used  . Alcohol Use: No      Review of Systems  Constitutional: Negative.  Negative for fever.  HENT: Positive for hearing loss. Negative for ear pain and ear discharge.   Gastrointestinal: Negative for nausea and vomiting.  Neurological: Negative for dizziness, loss of consciousness, syncope, speech difficulty, numbness, headaches and loss of balance.    Allergies  Vesicare; Aricept; Lamictal; Namenda; Oxycodone hcl; Sulfur; and Penicillins  Home Medications   Current Outpatient Rx  Name  Route  Sig  Dispense  Refill  . ALPRAZolam Prudy Feeler) 1  MG tablet   Oral   Take 1 mg by mouth 3 (three) times daily.          . carbamazepine (TEGRETOL) 200 MG tablet   Oral   Take 200 mg by mouth 3 (three) times daily.         . clonazePAM (KLONOPIN) 1 MG tablet   Oral   Take 1 mg by mouth 2 (two) times daily as needed for anxiety.         . cyclobenzaprine (FLEXERIL) 10 MG tablet   Oral   Take 10 mg by mouth 3 (three) times daily.          Marland Kitchen donepezil (ARICEPT) 10 MG tablet   Oral   Take 10 mg by mouth daily.         Marland Kitchen escitalopram (LEXAPRO) 20 MG tablet   Oral   Take 20 mg by mouth daily.          Marland Kitchen levothyroxine (SYNTHROID, LEVOTHROID) 175 MCG tablet   Oral   Take 175 mcg by mouth daily.         Marland Kitchen loratadine (CLARITIN) 10 MG tablet   Oral   Take 10 mg by  mouth daily.         Marland Kitchen morphine (MSIR) 15 MG tablet   Oral   Take 45 mg by mouth 3 (three) times daily.         Marland Kitchen amoxicillin (AMOXIL) 500 MG capsule   Oral   Take 1 capsule (500 mg total) by mouth 3 (three) times daily.   30 capsule   0   . fish oil-omega-3 fatty acids 1000 MG capsule   Oral   Take 1 g by mouth daily.         Marland Kitchen omeprazole (PRILOSEC) 20 MG capsule   Oral   Take 20 mg by mouth daily.          . rivastigmine (EXELON) 9.5 mg/24hr   Transdermal   Place 1 patch onto the skin daily.         . ziprasidone (GEODON) 60 MG capsule   Oral   Take 120 mg by mouth every evening.            BP 164/73  Pulse 80  Temp(Src) 98.1 F (36.7 C) (Oral)  Resp 16  SpO2 96%  Physical Exam  Nursing note and vitals reviewed. Constitutional: He is oriented to person, place, and time. He appears well-developed and well-nourished.  HENT:  Head: Normocephalic.  Right Ear: External ear and ear canal normal. Tympanic membrane is scarred. Tympanic membrane mobility is abnormal. A middle ear effusion is present.  Left Ear: External ear and ear canal normal. Tympanic membrane is scarred. Tympanic membrane mobility is abnormal. A middle ear effusion is present. Decreased hearing is noted.  Mouth/Throat: Oropharynx is clear and moist.  Eyes: Conjunctivae are normal. Pupils are equal, round, and reactive to light.  Neck: Normal range of motion. Neck supple.  Lymphadenopathy:    He has no cervical adenopathy.  Neurological: He is alert and oriented to person, place, and time.  Skin: Skin is warm and dry.    ED Course  Procedures (including critical care time)  Labs Reviewed - No data to display No results found.   1. Tinnitus, bilateral       MDM          Linna Hoff, MD 12/10/12 [id number]

## 2012-12-10 NOTE — ED Notes (Signed)
Pt states that he has been having ringing in ears off/on x several wks. Pt is c/o left ear sounds being muffled/cant hear . Denies pain and drainage. Pt recently had seizure 6 to 8 wks ago and just started seizure meds on 2/21. Carbamazepine 200mg  x 2 daily. Pt ?'s if seizure is the cause of ear problem.

## 2012-12-12 IMAGING — US US ABDOMEN LIMITED
1 series · 1 of 1 positions shown · non-contrast
Comparison: 12/08/2011

CLINICAL DATA: Pre biopsy exam

LIMITED ABDOMINAL ULTRASOUND

[Series 1: us abdomen limited · 0.30mm/px · 1 of 1 slices shown]
[im 1/1]
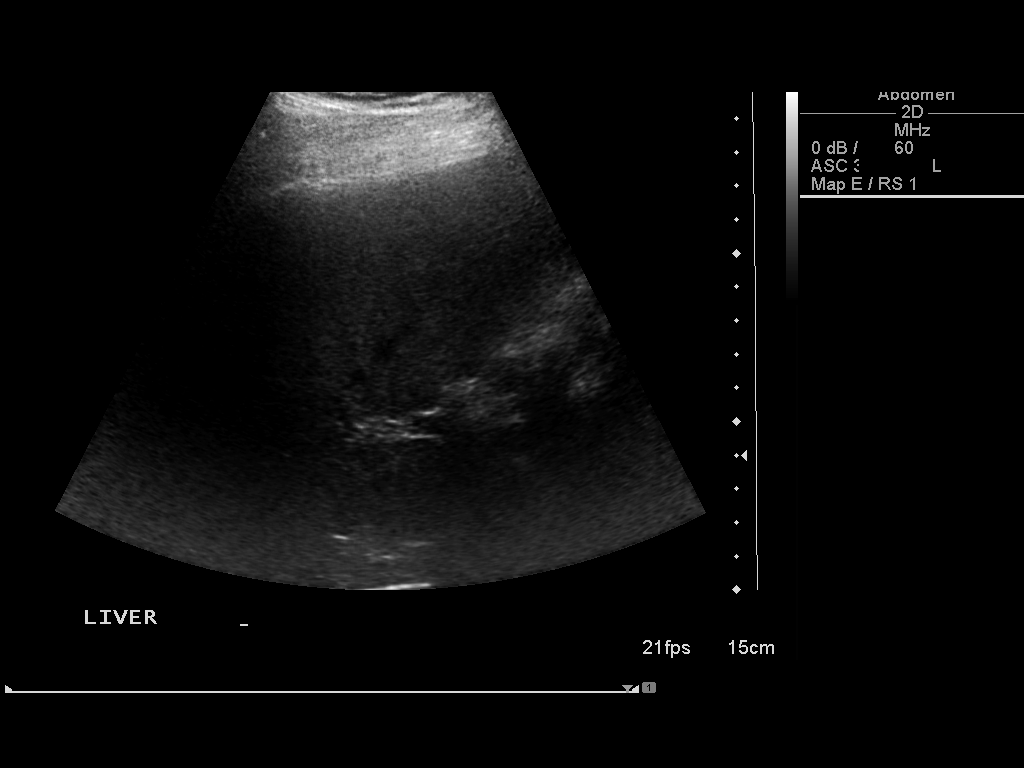

[1 of 1 positions shown; findings below may reference images not displayed]

FINDINGS: Imaging was performed for the purposes of biopsy
localization, performed by Dr. Sember.
IMPRESSION: No interpretation.  Ultrasound provided for real time guidance of
liver biopsy performed by Dr. Sember.

## 2012-12-13 ENCOUNTER — Encounter: Payer: Self-pay | Admitting: Cardiovascular Disease

## 2013-01-19 ENCOUNTER — Ambulatory Visit: Payer: Self-pay | Admitting: Neurology

## 2013-03-14 ENCOUNTER — Telehealth: Payer: Self-pay | Admitting: *Deleted

## 2013-03-14 NOTE — Telephone Encounter (Signed)
Patient is calling to tell us he needs an appointment with Dr. Marnee Guarneri and Dr. Anne Hahn.  He has had his sleep study and is trying to make a f/u appt asap.  He wants something sooner than the schedule has available.  Please advise.

## 2013-03-15 ENCOUNTER — Telehealth: Payer: Self-pay | Admitting: Neurology

## 2013-03-15 NOTE — Telephone Encounter (Signed)
I called and left a message for the patient to callback to the office to schedule appt. with scheduler.

## 2013-03-15 NOTE — Telephone Encounter (Signed)
I called and spoke with patient's spouse to set up appt. on 04-23-13@12 

## 2013-03-16 ENCOUNTER — Encounter: Payer: Self-pay | Admitting: Neurology

## 2013-03-19 NOTE — Progress Notes (Signed)
Quick Note:  The patient presents with a 95% compliance of VPAP use. Pressure window between 5 and 15 cm water. No change is necessary at this time, details will be discussed with the patient in his followup visit. Roy Snuffer, MD  ______

## 2013-03-30 ENCOUNTER — Encounter: Payer: Self-pay | Admitting: Neurology

## 2013-03-30 ENCOUNTER — Ambulatory Visit (INDEPENDENT_AMBULATORY_CARE_PROVIDER_SITE_OTHER): Payer: Medicare Other | Admitting: Neurology

## 2013-03-30 ENCOUNTER — Institutional Professional Consult (permissible substitution): Payer: Self-pay | Admitting: Neurology

## 2013-03-30 VITALS — BP 129/77 | HR 79 | Resp 15 | Ht 67.5 in | Wt 208.0 lb

## 2013-03-30 DIAGNOSIS — G4730 Sleep apnea, unspecified: Secondary | ICD-10-CM | POA: Insufficient documentation

## 2013-03-30 DIAGNOSIS — F1190 Opioid use, unspecified, uncomplicated: Secondary | ICD-10-CM

## 2013-03-30 DIAGNOSIS — G4737 Central sleep apnea in conditions classified elsewhere: Secondary | ICD-10-CM

## 2013-03-30 DIAGNOSIS — G4710 Hypersomnia, unspecified: Secondary | ICD-10-CM

## 2013-03-30 NOTE — Patient Instructions (Signed)
CPAP and BIPAP CPAP and BIPAP are methods of helping you breathe. CPAP stands for "continuous positive airway pressure." BIPAP stands for "bi-level positive airway pressure." Both CPAP and BIPAP are provided by a small machine with a flexible plastic tube that attaches to a plastic mask that goes over your nose or mouth. Air is blown into your air passages through your nose or mouth. This helps to keep your airways open and helps to keep you breathing well. The amount of pressure that is used to blow the air into your air passages can be set on the machine. The pressure setting is based on your needs. With CPAP, the amount of pressure stays the same while you breathe in and out. With BIPAP, the amount of pressure changes when you inhale and exhale. Your caregiver will recommend whether CPAP or BIPAP would be more helpful for you.  CPAP and BIPAP can be helpful for both adults and children with:  Sleep apnea.  Chronic Obstructive Pulmonary Disease (COPD), a condition like emphysema.  Diseases which weaken the muscles of the chest such as muscular dystrophy or neurological diseases.  Other problems that cause breathing to be weak or difficult. USE OF CPAP OR BIPAP The respiratory therapist or technician will help you get used to wearing the mask. Some people feel claustrophobic (a trapped or closed in feeling) at first, because the mask needs to be fairly snug on your face.   It may help you to get used to the mask gradually, by first holding the mask loosely over your nose or mouth using a low pressure setting on the machine. Gradually the mask can be applied more snugly with increased pressure. You can also gradually increase the amount of time the mask is used.  People with sleep apnea will use the mask and machine at night when they are sleeping. Others, like those with ALS or other breathing difficulties, may need the CPAP or BIPAP all the time.  If the first mask you try does not fit well,  or is uncomfortable, there are other types and sizes that can be tried.  If you tend to breathe through your mouth, a chin strap may be applied to help keep your mouth closed (if you are using a nasal mask).  The CPAP and BIPAP machines have alarms that may sound if the mask comes off or develops a leak.  You should not eat or drink while the CPAP or BIPAP is on. Food or fluids could get pushed into your lungs by the pressure of the CPAP or BIPAP. Sometimes CPAP or BIPAP machines are ordered for home use. If you are going to use the CPAP or BIPAP machine at home, follow these instructions  CPAP or BIPAP machines can be rented or purchased through home health care companies. There are many different brands of machines available. If you rent a machine before purchasing you may find which particular machine works well for you.  Ask questions if there is something you do not understand when picking out your machine.  Place your CPAP or BIPAP machine on a secure table or stand near an electrical outlet.  Know where the On/Off switch is.  Follow your doctor's instructions for how to set the pressure on your machine and when you should use it.  Do not smoke! Tobacco smoke residue can damage the machine. SEEK IMMEDIATE MEDICAL CARE IF:   You have redness or open areas around your nose or mouth.  You have trouble operating  the CPAP or BIPAP machine.  You cannot tolerate wearing the CPAP or BIPAP mask.  You have any questions or concerns. Document Released: 06/25/2004 Document Revised: 12/20/2011 Document Reviewed: 09/24/2008 [facility] Patient Information 2014 [place], [place]. Depression, Adult Depression is feeling sad, low, down in the dumps, blue, gloomy, or empty. In general, there are two kinds of depression:  Normal sadness or grief. This can happen after something upsetting. It often goes away on its own within 2 weeks. After losing a loved one (bereavement), normal sadness and grief  may last longer than two weeks. It usually gets better with time.  Clinical depression. This kind lasts longer than normal sadness or grief. It keeps you from doing the things you normally do in life. It is often hard to function at home, work, or at school. It may affect your relationships with others. Treatment is often needed. GET HELP RIGHT AWAY IF:  You have thoughts about hurting yourself or others.  You lose touch with reality (psychotic symptoms). You may:  See or hear things that are not real.  Have untrue beliefs about your life or people around you.  Your medicine is giving you problems. MAKE SURE YOU:  Understand these instructions.  Will watch your condition.  Will get help right away if you are not doing well or get worse. Document Released: 10/30/2010 Document Revised: 06/21/2012 Document Reviewed: 10/30/2010 [facility] Patient Information 2014 [place], [place]. Driving and Equipment Restrictions Some medical problems make it dangerous to drive, ride a bike, or use machines. Some of these problems are:  A hard blow to the head (concussion).  Passing out (fainting).  Twitching and shaking (seizures).  Low blood sugar.  Taking medicine to help you relax (sedatives).  Taking pain medicines.  Wearing an eye patch.  Wearing splints. This can make it hard to use parts of your body that you need to drive safely. HOME CARE   Do not drive until your doctor says it is okay.  Do not use machines until your doctor says it is okay. You may need a form signed by your doctor (medical release) before you can drive again. You may also need this form before you do other tasks where you need to be fully alert. MAKE SURE YOU:  Understand these instructions.  Will watch your condition.  Will get help right away if you are not doing well or get worse. Document Released: 11/04/2004 Document Revised: 12/20/2011 Document Reviewed: 02/04/2010 [facility] Patient Information  2014 [place], [place].

## 2013-03-30 NOTE — Progress Notes (Signed)
[facility]  Provider:  Dr Aidee Latimore Referring Provider: Elby Showers, MD Primary Care Physician:  Elby Showers, MD  Chief Complaint  Patient presents with  . Neurologic Problem    RM#[id number]    HPI:  Hayden Rose is a 62 y.o. male here as a referral from Dr. Clent Ridges and willis  for sleep consultation. The patient is established  dementia patient of  Dr. Clarisa Kindred in our office and was last seen  on 2 -- 21-14.   This review his history of present illness from that visit chief complaint at that time were a severe depression and progressive memory disorder. The patient has sturge weber  syndrome. He also will was seen by Dr. Anne Hahn for possible seizure event.  My visit with the patient is regarding has history of sleep apnea for which he was treated on CPAP and later on BiPAP but according to Dr. Anne Hahn is not he was not compliant with CPAP machine in the past - [facility] notes/ 2002 . Marland Kitchen He has a one-year history of intermittent urinary incontinence at nighttime. During sleep it'll come only call out at night/about the possibility of nocturnal seizures or REM behavior disorder was to be discussed. The patient recalled having nightmares he could see after cells events. He seemed to be very sleepy and cheeks after the spells. 1 at least has been associated with urinary incontinence, but there has been a tongue bite and an emergency room evaluation had been revealing and only mildly elevated liver transaminase panel chronically elevated. The patient also had a CT scan of the brain which was unremarkable at the time. The patient is currently treated by Dr. Anne Hahn with Aricept.  Had a polysomnography was a mild AHI and 2002 performed at [facility] under the guidance of Dr. Maple Hudson. He had undergone a UPPP in 2002 and but still had to use CPAP for the last 13 years-  he has back problems and sleeps in a recliner, takes narcotic pain medications chronically,  and his wife have  continued to witness apneas.   The patient today and dorsi Epworth sleepiness score at 12 points but in his last visit had and was the upper score at 20 and 22  points.  There may be a external  factors to contribute  these changes as the patient is treated with multiple medications that will cause fatigue . I find listed :  Aricept, Xanax ,  carbamazepine, clonazepam, Flexeril, morphine sulfate, oxycodone.   The patient underwent a split night polysomnography on 2-26 2014,   his BMI was 31.5 at the time of testing his Beck Depression Inventory 29 points, and   his Epworth sleepiness scale 22 point / his neck circumference at 16.5 inches.  The patient presented with an apnea hypoxia index of 94.5 the RDI was 101.8. 35 events occurred in rem sleep with a REM AHI of 76.4 but in non-REM sleep the AHI was higher at 98.2 the patient slept on night in supine position his lowest oxygen level was 74% is 52.8 minutes of desaturations. CPAP did not work well for this patient with responded better to a BiPAP setting of 9/5 cm water triggered central apneas but also seemed to have increased the patient's discomfort and struggling to stay asleep for fall asleep. The AHI was remarkably reduced to 0.0 at the BiPAP setting of 9 cm water over 5 cm border higher BiPAP settings created central apneas just as CPAP did  Good for the patient to have  a other BiPAP and to followup in the office in 30-60 days which is today the study was interpreted on 3-3 2014 today is 6-22,014 the patient begun using BiPAP . on 12/20/2012 the patient has been highly compliant this obstructive apnea index was reduced to 3.3 apneas per hour but central apneas were still noted at 18.7 per hour his average daily usage is 5 hours and 37 minutes and would fulfill the CMS criteria for 100% compliance his - his machine is and an auto -BiPAP , sat at the following pressures pressures; A support a 4 cm water,Maximum inspiratory pressure of 13 cm water,  minimum expiratory pressure of 4 cm water.  Patient uses a nasal pillow and a chin strap, which he likes. . He has been using the machine even that he traveled to the beach, and again his sleep score of 8 points lower than prior to the study. In spite of a high residual central apnea index. He had no more myoclonic jerks at night, no parasomnia and no incontinence.   He e prefers to sleep at a recliner. This is because of his back pain, he goes to bed normally around  11 PM after having already nodded off in a chair-  , falls asleep very quickly, he sleeps about 5-1/2 hours nightly . on the machine , but may sleep additional hours  throughout the day and evening. He has still trouble to control  sleepiness in daytime and he is not physically active ,watches TV a lot. ( he has been deemed  injured through service / service related injured , followed by the [place] ).   He underwent a cervical spinal fusion , and lower back surgery. This patient had no facial surgery, jaw or airway surgery and no nasal septum deviation.   Patient worked full-time until 2003 7 days a week 12 hours daily at times 15. 20 hours even. He was the first shift worker at the time. After he got injured at work in 2003 to the right wrist and back,  he became disabled.  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 This obstructive component has been well treated,  although  there is a reduction in his apnea index by over 75% almost 80%.  The central apnea component is still present at 18.5 . This may be as good as  possible with use of the positive airway treatment.  The patient will be release to Dr Anne Hahn'  Neurological care , and will follow up with the sleep clinic for PAP compliance  yearly.   The patient had presented to the ED after a seizure, and  The origin of the seizure could have been  medication related . He was on Geodon and  narcotics.  He is not allowed to drive , but resisted the driving restrictions: I will defer to Dr. Anne Hahn.       .  Review of Systems: Out of a complete 14 system review, the patient complains of only the following symptoms, and all other reviewed systems are negative. epwort 12 , becks depression inventory  29 points.   History   Social History  . Marital Status: Married    Spouse Name: N/A    Number of Children: 2  . Years of Education: N/A   Occupational History  . disabled    Social History Main Topics  . Smoking status: Former Smoker  Quit date: 12/27/1975  . Smokeless tobacco: Never Used  . Alcohol Use: No  . Drug Use: No  . Sexually Active: Yes    Birth Control/ Protection: Condom   Other Topics Concern  . Not on file   Social History Narrative   Disabled. Does not regularly exercise.     Family History  Problem Relation Age of Onset  . Coronary artery disease Mother   . Heart attack Mother     stents in neck and heart ?  Marland Kitchen Crohn's disease Mother   . Cirrhosis Sister   . Cirrhosis Father   . Prostate cancer Maternal Uncle   . Liver cancer Cousin   . Colitis Daughter   . Diabetes Maternal Uncle   . Diabetes Brother   . Kidney disease Maternal Aunt   . Kidney disease Cousin     Past Medical History  Diagnosis Date  . Skin cancer     basal cell on back x2  . Lewy body dementia   . Dyspnea   . Dizziness   . Orthostatic hypotension   . Anxiety   . Depression   . Hypothyroidism   . Osgood-Schlatter's disease   . Back pain     chronic  . Degenerative disc disease   . Arthritis   . Tachycardia     heart monitor last week for 48 hours no report yet  . Jaundice with stoppage of bile flow     after stent romoved  . Elevated LFTs   . Chronic kidney disease     stage III  renal disease per wife  . Sleep apnea with use of continuous positive airway pressure (CPAP)     Past Surgical History  Procedure Laterality Date  . Gallbladder  surgery  2007-2008  . Basal cell carcinoma excision  [id number]  . Lumbar fusion  1996  . Cervical fusion  1985  . Carpal tunnel release  2003  . Tonsillectomy    . Appendectomy    . Cholecystectomy    . Liver biopsy  12/27/2011    Procedure: LIVER BIOPSY;  Surgeon: Louis Meckel, MD;  Location: [facility];  Service: Endoscopy;  Laterality: N/A;  pt moved from 3/22 to 3/18 ( aw)    Current Outpatient Prescriptions  Medication Sig Dispense Refill  . ALPRAZolam (XANAX) 1 MG tablet Take 1 mg by mouth 3 (three) times daily.       . carbamazepine (TEGRETOL) 200 MG tablet Take 200 mg by mouth 3 (three) times daily.      . clonazePAM (KLONOPIN) 1 MG tablet Take 1 mg by mouth 2 (two) times daily as needed for anxiety.      . cyclobenzaprine (FLEXERIL) 10 MG tablet Take 10 mg by mouth 3 (three) times daily.       Marland Kitchen donepezil (ARICEPT) 10 MG tablet Take 10 mg by mouth daily.      Marland Kitchen escitalopram (LEXAPRO) 20 MG tablet Take 20 mg by mouth daily.       . fish oil-omega-3 fatty acids 1000 MG capsule Take 1 g by mouth daily.      Marland Kitchen levothyroxine (SYNTHROID, LEVOTHROID) 175 MCG tablet Take 175 mcg by mouth daily.      Marland Kitchen loratadine (CLARITIN) 10 MG tablet Take 10 mg by mouth daily.      Marland Kitchen morphine (MSIR) 15 MG tablet Take 45 mg by mouth 3 (three) times daily.      Marland Kitchen omeprazole (PRILOSEC) 20 MG capsule Take 20 mg  by mouth daily.       Marland Kitchen oxyCODONE (ROXICODONE) 15 MG immediate release tablet Take 15 mg by mouth 4 (four) times daily.      . ziprasidone (GEODON) 60 MG capsule Take 120 mg by mouth every evening.       Marland Kitchen amoxicillin (AMOXIL) 500 MG capsule Take 1 capsule (500 mg total) by mouth 3 (three) times daily.  30 capsule  0  . rivastigmine (EXELON) 9.5 mg/24hr Place 1 patch onto the skin daily.       No current facility-administered medications for this visit.    Allergies as of 03/30/2013 - Review Complete 03/30/2013  Allergen Reaction Noted  . Vesicare (solifenacin succinate) Anaphylaxis  12/08/2011  . Aricept (donepezil hydrochloride) Nausea And Vomiting and Other (See Comments) 12/08/2011  . Lamictal (lamotrigine)  12/10/2011  . Namenda (memantine hcl) Other (See Comments) 12/08/2011  . Oxycodone hcl Other (See Comments) 07/18/2009  . Sulfur Swelling 07/18/2009  . Penicillins Rash 07/18/2009    Vitals: BP 129/77  Pulse 79  Resp 15  Ht 5' 7.5" (1.715 m)  Wt 208 lb (94.348 kg)  BMI 32.08 kg/m2 Last Weight:  Wt Readings from Last 1 Encounters:  03/30/13 208 lb (94.348 kg)   Last Height:   Ht Readings from Last 1 Encounters:  03/30/13 5' 7.5" (1.715 m)     Physical exam:  General: The patient is drowsy, unattentive and  aloof, his wife answers,   He  asked no additional questions, except  "When he can drive again ?" . The patient is well groomed. Head: Normocephalic, atraumatic. Neck is supple. Mallampati 3 neck circumference:16.5 ,  Reduced ROM for neck rotation. No nasal septal deviation.  Cardiovascular:  Regular rate and rhythm, without  murmurs or carotid bruit, and without distended neck veins. Respiratory: Lungs are clear to auscultation. Skin:  Without evidence of edema, or rash Trunk: BMI is elevated and patient  has normal posture.  Neurologic exam : The patient is not alert, not fully  oriented to place and time.  Memory subjective per patient  described as intact, but clearly impaired in conversation. Limites  attention span & concentration ability.  Speech is hesitant  ,  aphasia. Mood and affect are depressed ,  Cranial nerves: Pupils are equal and briskly reactive to light. Funduscopic exam without  evidence of pallor or edema. Extraocular movements  in vertical and horizontal planes intact and without nystagmus. Visual fields by finger perimetry are intact. Hearing to finger rub intact.  Facial sensation intact to fine touch. Facial motor strength is symmetric and tongue and uvula move midline.  Motor exam:  Deferred  Neurological exam-  Patient  has been evaluated for sleep study follow up.    Plan continue BiPAP, nasal pillow and chin strap for treatment of complex apnea.   dementia and chronic pain syndrome , depression  Are  Not addressed in this visit.

## 2013-04-05 ENCOUNTER — Encounter: Payer: Self-pay | Admitting: Neurology

## 2013-04-10 ENCOUNTER — Encounter: Payer: Self-pay | Admitting: Neurology

## 2013-04-11 ENCOUNTER — Telehealth: Payer: Self-pay

## 2013-04-11 NOTE — Telephone Encounter (Signed)
I called and left patient a VM. DMV parking placard is not signed as there is no history that supports a need for one. Should Dr. Anne Hahn find something different at your July 14 OV, you can discuss it with him. I have a new form and will hold on to it until after you have been evaluated on July 14.

## 2013-04-23 ENCOUNTER — Encounter: Payer: Self-pay | Admitting: Neurology

## 2013-04-23 ENCOUNTER — Ambulatory Visit (INDEPENDENT_AMBULATORY_CARE_PROVIDER_SITE_OTHER): Payer: Medicare Other | Admitting: Neurology

## 2013-04-23 VITALS — BP 157/79 | HR 85 | Wt 206.0 lb

## 2013-04-23 DIAGNOSIS — G2581 Restless legs syndrome: Secondary | ICD-10-CM

## 2013-04-23 DIAGNOSIS — R413 Other amnesia: Secondary | ICD-10-CM

## 2013-04-23 HISTORY — DX: Restless legs syndrome: G25.81

## 2013-04-23 HISTORY — DX: Other amnesia: R41.3

## 2013-04-23 NOTE — Progress Notes (Signed)
Reason for visit: Memory disturbance  Hayden Rose is an 62 y.o. male  History of present illness:  Hayden Rose is a 62 year old right-handed white male with a history of depression, memory disturbance, and possible seizure-type events. The patient is on CPAP for severe sleep apnea. The patient will occasionally have enuresis, and he will thrash about at night while sleeping. The patient has restless leg syndrome as well. The patient is now tolerating the CPAP, but he still has a lot of issues while sleeping. The patient is on 10 mg of donepezil, and he takes the medication in the morning. The patient has recently been noted to have low back pain and and right knee discomfort. The patient may require surgery on the right knee in the near future. The patient returns to this office for an evaluation.  Past Medical History  Diagnosis Date  . Skin cancer     basal cell on back x2  . Lewy body dementia   . Dyspnea   . Dizziness   . Orthostatic hypotension   . Anxiety   . Depression   . Hypothyroidism   . Osgood-Schlatter's disease   . Back pain     chronic  . Degenerative disc disease   . Arthritis   . Tachycardia     heart monitor last week for 48 hours no report yet  . Jaundice with stoppage of bile flow     after stent romoved  . Elevated LFTs   . Sleep apnea with use of continuous positive airway pressure (CPAP)   . Memory deficits 04/23/2013  . Chronic kidney disease     stage III  renal disease   . Restless leg syndrome 04/23/2013  . Dupuytren's contracture of right hand   . Erectile dysfunction     Past Surgical History  Procedure Laterality Date  . Gallbladder surgery  2007-2008  . Basal cell carcinoma excision  [id number]  . Lumbar fusion  1996  . Cervical fusion  1985  . Carpal tunnel release  2003  . Tonsillectomy    . Appendectomy    . Cholecystectomy    . Liver biopsy  12/27/2011    Procedure: LIVER BIOPSY;  Surgeon: Louis Meckel, MD;  Location: [facility]  ENDOSCOPY;  Service: Endoscopy;  Laterality: N/A;  pt moved from 3/22 to 3/18 ( aw)    Family History  Problem Relation Age of Onset  . Coronary artery disease Mother   . Heart attack Mother     stents in neck and heart ?  Marland Kitchen Crohn's disease Mother   . Cirrhosis Sister   . Cirrhosis Father   . Prostate cancer Maternal Uncle   . Liver cancer Cousin   . Colitis Daughter   . Diabetes Maternal Uncle   . Diabetes Brother   . Kidney disease Maternal Aunt   . Kidney disease Cousin     Social history:  reports that he quit smoking about 37 years ago. He has never used smokeless tobacco. He reports that he does not drink alcohol or use illicit drugs.  Allergies:  Allergies  Allergen Reactions  . Vesicare (Solifenacin Succinate) Anaphylaxis  . Aricept (Donepezil Hydrochloride) Nausea And Vomiting and Other (See Comments)  . Lamictal (Lamotrigine)     Petechia  . Namenda (Memantine Hcl) Other (See Comments)    unknown  . Oxycodone Hcl Other (See Comments)    Patient states not allergic to oxycodone is currently taking unknown  . Sulfur Swelling  . Penicillins  Rash    Medications:  Current Outpatient Prescriptions on File Prior to Visit  Medication Sig Dispense Refill  . ALPRAZolam (XANAX) 1 MG tablet Take 1 mg by mouth 2 (two) times daily.       . carbamazepine (TEGRETOL) 200 MG tablet Take 200 mg by mouth 2 (two) times daily.       . clonazePAM (KLONOPIN) 1 MG tablet Take 1 mg by mouth 2 (two) times daily as needed for anxiety.      . cyclobenzaprine (FLEXERIL) 10 MG tablet Take 10 mg by mouth 3 (three) times daily.       Marland Kitchen donepezil (ARICEPT) 10 MG tablet Take 10 mg by mouth daily.      Marland Kitchen escitalopram (LEXAPRO) 20 MG tablet Take 20 mg by mouth daily.       . fish oil-omega-3 fatty acids 1000 MG capsule Take 1 g by mouth daily.      Marland Kitchen loratadine (CLARITIN) 10 MG tablet Take 10 mg by mouth daily.      Marland Kitchen morphine (MSIR) 15 MG tablet Take 45 mg by mouth 3 (three) times daily.       Marland Kitchen omeprazole (PRILOSEC) 20 MG capsule Take 20 mg by mouth daily.       Marland Kitchen oxyCODONE (ROXICODONE) 15 MG immediate release tablet Take 15 mg by mouth 4 (four) times daily.      . ziprasidone (GEODON) 60 MG capsule Take 120 mg by mouth every evening.       Marland Kitchen amoxicillin (AMOXIL) 500 MG capsule Take 1 capsule (500 mg total) by mouth 3 (three) times daily.  30 capsule  0  . rivastigmine (EXELON) 9.5 mg/24hr Place 1 patch onto the skin daily.       No current facility-administered medications on file prior to visit.    ROS:  Out of a complete 14 system review of symptoms, the patient complains only of the following symptoms, and all other reviewed systems are negative.  Weight gain, fatigue Palpitations of the heart, swelling in the legs Hearing deficits Eye pain Shortness of breath, snoring Impotence Feeling hot, flushing Joint pain, joint swelling, achy muscles Memory loss, confusion, numbness, weakness, slurred speech, tremor Depression, anxiety, decreased energy, disinterest in activities Sleepiness, snoring, restless legs  Blood pressure 157/79, pulse 85, weight 206 lb (93.441 kg).  Physical Exam  General: The patient is alert and cooperative at the time of the examination. The patient is moderately obese. The patient has a flat affect.  Skin: No significant peripheral edema is noted.   Neurologic Exam  Mental status: Mini-Mental status examination done today shows a total score of 27/30. The patient is able to name 7 animals in 60 seconds.  Cranial nerves: Facial symmetry is present. Speech is normal, no aphasia or dysarthria is noted. Extraocular movements are full. Visual fields are full.  Motor: The patient has good strength in all 4 extremities.  Coordination: The patient has good finger-nose-finger and heel-to-shin bilaterally.  Gait and station: The patient has a normal gait, but he walks with a cane. Tandem gait was not attempted. Romberg is negative. No drift is  seen.  Reflexes: Deep tendon reflexes are symmetric.   Assessment/Plan:  1. Depression  2. Sleep apnea on CPAP  3. Memory disturbance  4. Chronic low back pain  5. Questionable seizure event  6. 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 The patient has severe sleep apnea, and his sleep  is quite restless at night. The Aricept medication will may cause vivid dreams. The patient will be cut back to a 5 mg dose for several weeks, and if the sleep disturbances continue, a sleep deprived EEG study will be ordered. The patient will otherwise followup in 6-8 months. His memory evaluation appears to be relatively stable over time.  Marlan Palau MD 04/23/2013 7:24 PM  [facility] [address] [place], [place] [address]  Phone [phone] Fax [phone]

## 2013-04-27 ENCOUNTER — Telehealth: Payer: Self-pay

## 2013-04-27 NOTE — Telephone Encounter (Signed)
I called to confirm that patient wanted to withdraw application for parking plackard, based on last VM to them and most recent OV. I spoke with patient's wife. She said, Yes. We are requesting that be withdraen.

## 2013-05-18 ENCOUNTER — Encounter: Payer: Self-pay | Admitting: Gastroenterology

## 2013-07-26 ENCOUNTER — Encounter: Payer: Self-pay | Admitting: Gastroenterology

## 2013-08-16 ENCOUNTER — Other Ambulatory Visit: Payer: Self-pay

## 2013-08-30 ENCOUNTER — Telehealth: Payer: Self-pay | Admitting: Neurology

## 2013-08-30 NOTE — Telephone Encounter (Signed)
Please advise 

## 2013-08-31 NOTE — Telephone Encounter (Signed)
Klonipin is used as longer acting medication in patients with REM behavior disorder and often works better than other benzodiazepines, it can help with RLSyndrome , too.   alpazolam is a medication we try to avoid- in sleep medicine.  Who is the pain physician and how can we reach him ?

## 2013-09-03 NOTE — Telephone Encounter (Signed)
Spoke with patient's wife and the pain management doctor is Dr. Ena Dawley, she can be reached at [phone] ext [phone].  I told wife that doctor would speak with other doctor about medications.

## 2013-09-05 NOTE — Telephone Encounter (Signed)
Left message with pain doctor at [facility]. She is out of the office through dec 1 st. 2014.  I explained my rational for the use of Klonopin. I asked her to a call us ( Dr Anne Hahn  Is primary neurologist ) back . This patient has dementia and sleep disturbances. Marland Kitchen

## 2013-09-11 NOTE — Telephone Encounter (Signed)
Left message for patient's wife that Dr. Vickey Huger left a message with the pain doctor at the [facility] explaining her rational for the use of Klonopin.  Told to call back if she had further questions.

## 2013-09-12 ENCOUNTER — Encounter: Payer: Medicare Other | Admitting: Gastroenterology

## 2013-11-09 ENCOUNTER — Ambulatory Visit: Payer: Medicare Other | Admitting: Neurology

## 2014-01-01 ENCOUNTER — Encounter: Payer: Self-pay | Admitting: Gastroenterology

## 2014-03-13 ENCOUNTER — Telehealth: Payer: Self-pay

## 2014-03-13 NOTE — Telephone Encounter (Signed)
Called to offer patient earlier appt with Dr.Willis (on waitlist). Spouse stated unable to make it today, best to just leave the appt in July. Remove off of waitlist.

## 2014-04-02 ENCOUNTER — Ambulatory Visit (INDEPENDENT_AMBULATORY_CARE_PROVIDER_SITE_OTHER): Payer: Medicare Other | Admitting: Neurology

## 2014-04-02 ENCOUNTER — Encounter: Payer: Self-pay | Admitting: Neurology

## 2014-04-02 VITALS — BP 142/96 | HR 84 | Wt 214.0 lb

## 2014-04-02 DIAGNOSIS — F1110 Opioid abuse, uncomplicated: Secondary | ICD-10-CM

## 2014-04-02 DIAGNOSIS — G4737 Central sleep apnea in conditions classified elsewhere: Secondary | ICD-10-CM

## 2014-04-02 DIAGNOSIS — Z79899 Other long term (current) drug therapy: Secondary | ICD-10-CM | POA: Insufficient documentation

## 2014-04-02 DIAGNOSIS — F1190 Opioid use, unspecified, uncomplicated: Secondary | ICD-10-CM | POA: Insufficient documentation

## 2014-04-02 NOTE — Progress Notes (Addendum)
[facility]  Provider:  Dr Lanny Donoso Referring Provider: Myrtis Ser, MD Primary Care Physician:  Horton Finer, MD  Chief Complaint  Patient presents with  . Follow-up    Room 10  . Sleep Apnea    HPI:  Hayden Rose is a 63 y.o. male here as a revisit  from Dr. Jannifer Franklin  And PCP Dr Volanda Napoleon for sleep clinic follow up.  The patient is established  dementia patient of  Dr. Tobey Grim in our office.  [name] wife is here today accompanying her husband she states that he has been on multiple medications still a lot of them are having sedative side effects and may therefore contribute to the central apnea the patient had experienced. Visible also has often been depressed with major depression in the past. He suffers from chronic pain treated with narcotic pain medication. He endorsed today the geriatric depression scale at 15 points which is the highest score the fatigue severity score was 53 points in the Epworth sleepiness scale was endorsed at 22 points.  The patient had undergone a sleep study with an auto  BiPAP titration in February 2014. He had a similar Epworth score at this time prior to titration. His baseline apnea index was 94.7 , the RDI was 101.8 by sleep was in supine position, for  52.8 minutes of desaturation of oxygen, the nadir being  74% oxygenation.  In the office download for this S 9 VPAP  machine over 90 days chills a minimum expiratory pressure support of 4 cm water and a maximum of 13 cm ,  the 95th percentile for inspiratory pressure is 12.5 cm water expiratory pressure 95th percentile , the 8.5 cm water air leak is very low.  Respiratory rate is 12 minutes the residual AHI was 17/hour of which 10 Apneas per hour are of central  origin.  He has used the machine over the last 90 days on 85 days , with a median time in therapy of 6 hours and 35 minutes per night. I suspect the central apneas to be induced by narcotics.  His mouth is dry in the  morning, he has still a full facial beard. He goes once to the bathroom at night.   His narcotic dose - morphine , is taken aground 19.45 hours , and he takes Flexaril and seizure medications . This can affect his AHI. All medications come through the [place]. 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  His wife noticed him moaning and groaning in the morning, when she is at his bedside. She is unaware of this goes on all night.  He may need an overnight ONO.         Last visit note :   This review his history of present illness .Chief complaint at that time were a severe depression and progressive memory disorder. The patient has Sturge Weber syndrome. He also will was seen by Dr. Jannifer Franklin for possible seizure event. There is a suspicion of Lewy Body dementia.   Regarding his history of sleep apnea for which he was treated on CPAP and later on BiPAP,  but according to Dr. Jannifer Franklin was completely intolerant of  CPAP machine in the past - [facility] notes/ 2002 . He has a one-year history of intermittent urinary incontinence at nighttime. During sleep it'll come only call out at night/about the possibility of nocturnal seizures or REM behavior disorder was to be discussed. The patient recalled having nightmares he could see after cells events.  He seemed  to be very sleepy and cheeks after the spells. 1 at least has been associated with urinary incontinence, but there has been a tongue bite and an emergency room evaluation had been revealing and only mildly elevated liver transaminase panel chronically elevated. The patient also had a CT scan of the brain which was unremarkable at the time. The patient is currently treated by Dr. Jannifer Franklin with Aricept.  Had a polysomnography in 2002,  performed at [facility] under the guidance of Dr. Annamaria Boots. He had undergone a UPPP in 2002 and but still had to use CPAP for the last 13 years-  he has back problems and sleeps in a recliner, takes narcotic pain medications  chronically,  and his wife have continued to witness apneas.   The patient today and endorsed  Epworth sleepiness score at 12 /24 points but in his last visit had and was the upper score at 20 and 22  points.    There may be a external  factors to contribute  these changes as the patient is treated with multiple medications that will cause fatigue . I find listed :  Aricept, Xanax ,  carbamazepine, clonazepam, Flexeril, morphine sulfate, oxycodone.   The patient underwent a split night polysomnography on 2-26 2014,   his BMI was 31.5 at the time of testing his Beck Depression Inventory 29 points, and   his Epworth sleepiness scale 22 point / his neck circumference at 16.5 inches.  The patient presented with an apnea hypoxia index of 94.5 the RDI was 101.8. 35 events occurred in rem sleep with a REM AHI of 76.4 but in non-REM sleep the AHI was higher at 98.2 the patient slept on night in supine position his lowest oxygen level was 74% is 52.8 minutes of desaturations. CPAP did not work well for this patient with responded better to a BiPAP setting of 9/5 cm water triggered central apneas but also seemed to have increased the patient's discomfort and struggling to stay asleep for fall asleep. The AHI was remarkably reduced to 0.0 at the BiPAP setting of 9 cm water over 5 cm border higher BiPAP settings created central apneas just as CPAP did  Good for the patient to have a other BiPAP and to followup in the office in 30-60 days which is today the study was interpreted on 3-3 2014 today is 6-22,014 the patient begun using BiPAP . on 12/20/2012 the patient has been highly compliant this obstructive apnea index was reduced to 3.3 apneas per hour but central apneas were still noted at 18.7 per hour his average daily usage is 5 hours and 37 minutes and would fulfill the CMS criteria for 100% compliance his - his machine is and an auto -BiPAP , sat at the following pressures pressures; A support a 4 cm  water,Maximum inspiratory pressure of 13 cm water, minimum expiratory pressure of 4 cm water.  Patient uses a nasal pillow and a chin strap, which he likes. . He has been using the machine even that he traveled to the beach, and again his sleep score of 8 points lower than prior to the study. In spite of a high residual central apnea index. He had no more myoclonic jerks at night, no parasomnia and no incontinence.   He e prefers to sleep at a recliner. This is because of his back pain, he goes to bed normally around  11 PM after having already nodded off in a chair-  , falls asleep very quickly, he  sleeps about 5-1/2 hours nightly . on the machine , but may sleep additional hours  throughout the day and evening. He has still trouble to control  sleepiness in daytime and he is not physically active ,watches TV a lot. ( he has been deemed  injured through service / service related injured , followed by the [place] ).   He underwent a cervical spinal fusion , and lower back surgery. This patient had no facial surgery, jaw or airway surgery and no nasal septum deviation.   Patient worked full-time until 2003 7 days a week 12 hours daily at times 15. 20 hours even. He was the first shift worker at the time. After he got injured at work in 2003 to the right wrist and back,  he became disabled.  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 This obstructive component has been well treated,  although  there is a reduction in his apnea index by over 75% almost 80%.  The central apnea component is still present at 18.5 . This may be as good as  possible with use of the positive airway treatment.  The patient will be release to Dr Jannifer Franklin'  Neurological care , and will follow up with the sleep clinic for PAP compliance  yearly.   The patient had presented to the ED after a seizure, and  The origin of the seizure could have  been  medication related . He was on Geodon and narcotics.  He is not allowed to drive , but resisted the driving restrictions: I will defer to Dr. Jannifer Franklin.       .  Review of Systems: Out of a complete 14 system review, the patient complains of only the following symptoms, and all other reviewed systems are negative. epwort 12 , becks depression inventory  29 points.   History   Social History  . Marital Status: Married    Spouse Name: Barnetta Chapel    Number of Children: 2  . Years of Education: 9th   Occupational History  . disabled    Social History Main Topics  . Smoking status: Former Smoker    Quit date: 12/27/1975  . Smokeless tobacco: Never Used  . Alcohol Use: No  . Drug Use: No  . Sexual Activity: Yes    Birth Control/ Protection: Condom   Other Topics Concern  . Not on file   Social History Narrative   Patient is married Barnetta Chapel) and lives at home with his wife and his mother.   Patient is disabled.   Patient has a 9th grade education.   Patient is right-handed.   Patient does not drink any caffeine.   Patient has two adult childre   Does not regularly exercise.     Family History  Problem Relation Age of Onset  . Coronary artery disease Mother   . Heart attack Mother     stents in neck and heart ?  Marland Kitchen Crohn's disease Mother   . Cirrhosis Sister   . Cirrhosis Father   . Prostate cancer Maternal Uncle   . Liver cancer Cousin   . Colitis Daughter   . Diabetes Maternal Uncle   . Diabetes Brother   . Kidney disease Maternal Aunt   . Kidney disease Cousin     Past Medical History  Diagnosis Date  . Skin cancer     basal cell on back x2  . Lewy  body dementia   . Dyspnea   . Dizziness   . Orthostatic hypotension   . Anxiety   . Depression   . Hypothyroidism   . Osgood-Schlatter's disease   . Back pain     chronic  . Degenerative disc disease   . Arthritis   . Tachycardia     heart monitor last week for 48 hours no report yet  .  Jaundice with stoppage of bile flow     after stent romoved  . Elevated LFTs   . Sleep apnea with use of continuous positive airway pressure (CPAP)   . Memory deficits 04/23/2013  . Chronic kidney disease     stage III  renal disease   . Restless leg syndrome 04/23/2013  . Dupuytren's contracture of right hand   . Erectile dysfunction     Past Surgical History  Procedure Laterality Date  . Gallbladder surgery  2007-2008  . Basal cell carcinoma excision  [id number]  . Lumbar fusion  1996  . Cervical fusion  1985  . Carpal tunnel release  2003  . Tonsillectomy    . Appendectomy    . Cholecystectomy    . Liver biopsy  12/27/2011    Procedure: LIVER BIOPSY;  Surgeon: Inda Castle, MD;  Location: [facility] ENDOSCOPY;  Service: Endoscopy;  Laterality: N/A;  pt moved from 3/22 to 3/18 ( aw)    Current Outpatient Prescriptions  Medication Sig Dispense Refill  . ALPRAZolam (XANAX) 1 MG tablet Take 1 mg by mouth 2 (two) times daily.       . carbamazepine (TEGRETOL) 200 MG tablet Take 200 mg by mouth 2 (two) times daily.       . clonazePAM (KLONOPIN) 1 MG tablet Take 1 mg by mouth 2 (two) times daily as needed for anxiety.      . cyclobenzaprine (FLEXERIL) 10 MG tablet Take 10 mg by mouth 3 (three) times daily.       Marland Kitchen donepezil (ARICEPT) 5 MG tablet Take 5 mg by mouth at bedtime.      . fish oil-omega-3 fatty acids 1000 MG capsule Take 1 g by mouth daily.      Marland Kitchen levothyroxine (SYNTHROID, LEVOTHROID) 112 MCG tablet Take 112 mcg by mouth daily before breakfast.      . loratadine (CLARITIN) 10 MG tablet Take 10 mg by mouth daily.      Marland Kitchen omeprazole (PRILOSEC) 20 MG capsule Take 20 mg by mouth daily.       . OXYCODONE HCL PO Take by mouth. Taking 15 mg by mouth 4 (four) times daily.      . rivastigmine (EXELON) 9.5 mg/24hr Place 1 patch onto the skin daily.      . ziprasidone (GEODON) 60 MG capsule Take 180 mg by mouth every evening.        No current facility-administered medications for this  visit.    Allergies as of 04/02/2014 - Review Complete 04/02/2014  Allergen Reaction Noted  . Vesicare [solifenacin succinate] Anaphylaxis 12/08/2011  . Aricept [donepezil hydrochloride] Nausea And Vomiting and Other (See Comments) 12/08/2011  . Lamictal [lamotrigine]  12/10/2011  . Namenda [memantine hcl] Other (See Comments) 12/08/2011  . Oxycodone hcl Other (See Comments) 07/18/2009  . Sulfur Swelling 07/18/2009  . Exelon [rivastigmine] Rash 04/02/2014  . Fentanyl Rash 04/02/2014  . Penicillins Rash 07/18/2009    Vitals: BP 142/96  Pulse 84  Wt 214 lb (97.07 kg) Last Weight:  Wt Readings from Last 1 Encounters:  04/02/14 214  lb (97.07 kg)   Last Height:   Ht Readings from Last 1 Encounters:  03/30/13 5' 7.5" (1.715 m)     Physical exam:  General: The patient is drowsy, unattentive and  aloof, his wife answers,   He  asked no additional questions, except  "When he can drive again ?" . The patient is well groomed. Head: Normocephalic, atraumatic. Neck is supple. Mallampati 3 neck circumference:16.5 ,  Reduced ROM for neck rotation. No nasal septal deviation.  Cardiovascular:  Regular rate and rhythm, without  murmurs or carotid bruit, and without distended neck veins. Respiratory: Lungs are clear to auscultation. Skin:  Without evidence of edema, or rash Trunk: BMI is elevated and patient  has normal posture.  Neurologic exam : The patient is not alert, not fully  oriented to place and time.  Memory subjective per patient  described as intact, but clearly impaired in conversation. Limites  attention span & concentration ability.  Speech is hesitant  ,  aphasia. Mood and affect are depressed ,  He is almost katatonic, abulic.  Cranial nerves: Pupils are equal and briskly reactive to light. Funduscopic exam without  evidence of pallor or edema. Extraocular movements  in vertical and horizontal planes intact and without nystagmus. Visual fields by finger perimetry are  intact. Hearing to finger rub intact.  Facial sensation intact to fine touch. Facial motor strength is symmetric and tongue and uvula move midline.  Motor exam:  Deferred  Neurological exam-  Patient has been evaluated for sleep study follow up.    Plan continue S9 VPAP , use  nasal pillow and chin strap for treatment of complex apnea.   dementia and chronic pain syndrome , major  depression are not addressed in this visit.

## 2014-04-02 NOTE — Patient Instructions (Signed)
Sleep Apnea  Sleep apnea is a sleep disorder characterized by abnormal pauses in breathing while you sleep. When your breathing pauses, the level of oxygen in your blood decreases. This causes you to move out of deep sleep and into light sleep. As a result, your quality of sleep is poor, and the system that carries your blood throughout your body (cardiovascular system) experiences stress. If sleep apnea remains untreated, the following conditions can develop:  High blood pressure (hypertension).  Coronary artery disease.  Inability to achieve or maintain an erection (impotence).  Impairment of your thought process (cognitive dysfunction). There are three types of sleep apnea: 1. Obstructive sleep apnea--Pauses in breathing during sleep because of a blocked airway. 2. Central sleep apnea--Pauses in breathing during sleep because the area of the brain that controls your breathing does not send the correct signals to the muscles that control breathing. 3. Mixed sleep apnea--A combination of both obstructive and central sleep apnea. RISK FACTORS The following risk factors can increase your risk of developing sleep apnea:  Being overweight.  Smoking.  Having narrow passages in your nose and throat.  Being of older age.  Being male.  Alcohol use.  Sedative and tranquilizer use.  Ethnicity. Among individuals younger than 35 years, African Americans are at increased risk of sleep apnea. SYMPTOMS   Difficulty staying asleep.  Daytime sleepiness and fatigue.  Loss of energy.  Irritability.  Loud, heavy snoring.  Morning headaches.  Trouble concentrating.  Forgetfulness.  Decreased interest in sex. DIAGNOSIS  In order to diagnose sleep apnea, your caregiver will perform a physical examination. Your caregiver may suggest that you take a home sleep test. Your caregiver may also recommend that you spend the night in a sleep lab. In the sleep lab, several monitors record  information about your heart, lungs, and brain while you sleep. Your leg and arm movements and blood oxygen level are also recorded. TREATMENT The following actions may help to resolve mild sleep apnea:  Sleeping on your side.   Using a decongestant if you have nasal congestion.   Avoiding the use of depressants, including alcohol, sedatives, and narcotics.   Losing weight and modifying your diet if you are overweight. There also are devices and treatments to help open your airway:  Oral appliances. These are custom-made mouthpieces that shift your lower jaw forward and slightly open your bite. This opens your airway.  Devices that create positive airway pressure. This positive pressure "splints" your airway open to help you breathe better during sleep. The following devices create positive airway pressure:  Continuous positive airway pressure (CPAP) device. The CPAP device creates a continuous level of air pressure with an air pump. The air is delivered to your airway through a mask while you sleep. This continuous pressure keeps your airway open.  Nasal expiratory positive airway pressure (EPAP) device. The EPAP device creates positive air pressure as you exhale. The device consists of single-use valves, which are inserted into each nostril and held in place by adhesive. The valves create very little resistance when you inhale but create much more resistance when you exhale. That increased resistance creates the positive airway pressure. This positive pressure while you exhale keeps your airway open, making it easier to breath when you inhale again.  Bilevel positive airway pressure (BPAP) device. The BPAP device is used mainly in patients with central sleep apnea. This device is similar to the CPAP device because it also uses an air pump to deliver continuous air pressure   through a mask. However, with the BPAP machine, the pressure is set at two different levels. The pressure when you  exhale is lower than the pressure when you inhale.  Surgery. Typically, surgery is only done if you cannot comply with less invasive treatments or if the less invasive treatments do not improve your condition. Surgery involves removing excess tissue in your airway to create a wider passage way. Document Released: 09/17/2002 Document Revised: 01/22/2013 Document Reviewed: 02/03/2012 [employer] Patient Information 2015 [employer]. This information is not intended to replace advice given to you by your health care provider. Make sure you discuss any questions you have with your health care provider.  

## 2014-04-02 NOTE — Addendum Note (Signed)
Addended by: Larey Seat on: 04/02/2014 03:10 PM   Modules accepted: Orders

## 2014-04-23 ENCOUNTER — Encounter: Payer: Self-pay | Admitting: Neurology

## 2014-04-23 ENCOUNTER — Ambulatory Visit (INDEPENDENT_AMBULATORY_CARE_PROVIDER_SITE_OTHER): Payer: Medicare Other | Admitting: Neurology

## 2014-04-23 VITALS — BP 142/74 | HR 64 | Wt 207.0 lb

## 2014-04-23 DIAGNOSIS — G2581 Restless legs syndrome: Secondary | ICD-10-CM

## 2014-04-23 DIAGNOSIS — R413 Other amnesia: Secondary | ICD-10-CM

## 2014-04-23 DIAGNOSIS — G4730 Sleep apnea, unspecified: Secondary | ICD-10-CM

## 2014-04-23 DIAGNOSIS — G4733 Obstructive sleep apnea (adult) (pediatric): Secondary | ICD-10-CM

## 2014-04-23 MED ORDER — CARBAMAZEPINE 200 MG PO TABS: ORAL_TABLET | ORAL | Status: DC

## 2014-04-23 NOTE — Patient Instructions (Signed)
Restless Legs Syndrome Restless legs syndrome is a movement disorder. It may also be called a sensori-motor disorder.  CAUSES  No one knows what specifically causes restless legs syndrome, but it tends to run in families. It is also more common in people with low iron, in pregnancy, in people who need dialysis, and those with nerve damage (neuropathy).Some medications may make restless legs syndrome worse.Those medications include drugs to treat high blood pressure, some heart conditions, nausea, colds, allergies, and depression. SYMPTOMS Symptoms include uncomfortable sensations in the legs. These leg sensations are worse during periods of inactivity or rest. They are also worse while sitting or lying down. Individuals that have the disorder describe sensations in the legs that feel like:  Pulling.  Drawing.  Crawling.  Worming.  Boring.  Tingling.  Pins and needles.  Prickling.  Pain. The sensations are usually accompanied by an overwhelming urge to move the legs. Sudden muscle jerks may also occur. Movement provides temporary relief from the discomfort. In rare cases, the arms may also be affected. Symptoms may interfere with going to sleep (sleep onset insomnia). Restless legs syndrome may also be related to periodic limb movement disorder (PLMD). PLMD is another more common motor disorder. It also causes interrupted sleep. The symptoms from PLMD usually occur most often when you are awake. TREATMENT  Treatment for restless legs syndrome is symptomatic. This means that the symptoms are treated.   Massage and cold compresses may provide temporary relief.  Walk, stretch, or take a cold or hot bath.  Get regular exercise and a good night's sleep.  Avoid caffeine, alcohol, nicotine, and medications that can make it worse.  Do activities that provide mental stimulation like discussions, needlework, and video games. These may be helpful if you are not able to walk or  stretch. Some medications are effective in relieving the symptoms. However, many of these medications have side effects. Ask your caregiver about medications that may help your symptoms. Correcting iron deficiency may improve symptoms for some patients. Document Released: 09/17/2002 Document Revised: 12/20/2011 Document Reviewed: 12/24/2010 [facility] Patient Information 2015 [place], [place]. This information is not intended to replace advice given to you by your health care provider. Make sure you discuss any questions you have with your health care provider.

## 2014-04-23 NOTE — Progress Notes (Signed)
Reason for visit: Memory disturbance  Hayden Rose is an 63 y.o. male  History of present illness:  Hayden Rose is a 63 year old right-handed white male with a history of a memory disturbance. The patient indicates that he has been evaluated in the past and has been told that he has Lewy body dementia. The patient however, indicates that he is not having issues with hallucinations or wide variations in cognitive functioning from day-to-day. The patient does have a chronic pain syndrome, and he is on daily opiate medications that include oxycodone taking 15 mg 4 times daily and he is on morphine extended release tablets taking 60 mg 3 times daily. He has severe depression, and he is on Geodon. The patient is on Aricept taking 5 mg at night. He has a lot of restlessness at night, and the addition of clonazepam has been beneficial. The patient more recently has been placed on carbamazepine. He returns to this office for an evaluation. The patient is extremely inactive, and he will sit about the house in a chair, without any daily activities.   Past Medical History  Diagnosis Date  . Skin cancer     basal cell on back x2  . Lewy body dementia   . Dyspnea   . Dizziness   . Orthostatic hypotension   . Anxiety   . Depression   . Hypothyroidism   . Osgood-Schlatter's disease   . Back pain     chronic  . Degenerative disc disease   . Arthritis   . Tachycardia     heart monitor last week for 48 hours no report yet  . Jaundice with stoppage of bile flow     after stent romoved  . Elevated LFTs   . Sleep apnea with use of continuous positive airway pressure (CPAP)   . Memory deficits 04/23/2013  . Chronic kidney disease     stage III  renal disease   . Restless leg syndrome 04/23/2013  . Dupuytren's contracture of right hand   . Erectile dysfunction     Past Surgical History  Procedure Laterality Date  . Gallbladder surgery  2007-2008  . Basal cell carcinoma excision  [id number]  .  Lumbar fusion  1996  . Cervical fusion  1985  . Carpal tunnel release  2003  . Tonsillectomy    . Appendectomy    . Cholecystectomy    . Liver biopsy  12/27/2011    Procedure: LIVER BIOPSY;  Surgeon: Inda Castle, MD;  Location: [facility];  Service: Endoscopy;  Laterality: N/A;  pt moved from 3/22 to 3/18 ( aw)    Family History  Problem Relation Age of Onset  . Coronary artery disease Mother   . Heart attack Mother     stents in neck and heart ?  Marland Kitchen Crohn's disease Mother   . Cirrhosis Sister   . Cirrhosis Father   . Prostate cancer Maternal Uncle   . Liver cancer Cousin   . Colitis Daughter   . Diabetes Maternal Uncle   . Diabetes Brother   . Kidney disease Maternal Aunt   . Kidney disease Cousin     Social history:  reports that he quit smoking about 38 years ago. He has never used smokeless tobacco. He reports that he does not drink alcohol or use illicit drugs.    Allergies  Allergen Reactions  . Vesicare [Solifenacin Succinate] Anaphylaxis  . Aricept [Donepezil Hydrochloride] Nausea And Vomiting and Other (See Comments)  . Lamictal [  Lamotrigine]     Petechia  . Namenda [Memantine Hcl] Other (See Comments)    unknown  . Oxycodone Hcl Other (See Comments)    Patient states not allergic to oxycodone is currently taking unknown  . Sulfur Swelling  . Exelon [Rivastigmine] Rash    patch  . Fentanyl Rash    patch  . Penicillins Rash    Medications:  Current Outpatient Prescriptions on File Prior to Visit  Medication Sig Dispense Refill  . ALPRAZolam (XANAX) 1 MG tablet Take 1 mg by mouth 2 (two) times daily.       . clonazePAM (KLONOPIN) 1 MG tablet Take 1 mg by mouth daily. 1 tab @ 1 pm      . cyclobenzaprine (FLEXERIL) 10 MG tablet Take 10 mg by mouth 3 (three) times daily.       Marland Kitchen donepezil (ARICEPT) 5 MG tablet Take 5 mg by mouth at bedtime.      . fish oil-omega-3 fatty acids 1000 MG capsule Take 1 g by mouth daily.      Marland Kitchen levothyroxine (SYNTHROID,  LEVOTHROID) 112 MCG tablet Take 112 mcg by mouth daily before breakfast.      . omeprazole (PRILOSEC) 20 MG capsule Take 20 mg by mouth 2 (two) times daily before a meal.       . ziprasidone (GEODON) 60 MG capsule Take 180 mg by mouth every evening.        No current facility-administered medications on file prior to visit.    ROS:  Out of a complete 14 system review of symptoms, the patient complains only of the following symptoms, and all other reviewed systems are negative.  Activity change, appetite change, fatigue, excessive sweating Hearing loss, ringing in the ears, drooling Blurred vision Shortness of breath Leg swelling Flushing Restless legs, sleep apnea, daytime sleepiness, snoring, sleep talking, acting out dreams Frequent urination Memory loss, dizziness, headache, weakness, tremors, facial drooping Skin rash Agitation, behavior problems, confusion, decreased concentration, depression, anxiety  Blood pressure 142/74, pulse 64, weight 207 lb (93.895 kg).  Physical Exam  General: The patient is alert and cooperative at the time of the examination. The patient is moderately obese.  Skin: No significant peripheral edema is noted.   Neurologic Exam  Mental status: The Mini-Mental status examination done today shows a total score 26/30.  Cranial nerves: Facial symmetry is present. Speech is normal, no aphasia or dysarthria is noted. Extraocular movements are full. Visual fields are full. The patient has a flat affect.  Motor: The patient has good strength in all 4 extremities.  Sensory examination: Soft touch sensation is symmetric on the face, arms, and legs.  Coordination: The patient has good finger-nose-finger and heel-to-shin bilaterally.  Gait and station: The patient has a normal gait, but he walks with a cane. Tandem gait is minimally unsteady. Romberg is negative. No drift is seen.  Reflexes: Deep tendon reflexes are symmetric.   Assessment/Plan:  1.  Reported memory disturbance, questionable Lewy body dementia  2. Chronic pain syndrome, low back pain  3. Sleep apnea on CPAP  4. Daily opiate use  The patient has a complex history associated with his cognitive issues. He has a chronic pain syndrome, severe depression, chronic opiates and benzodiazepine use. The patient has sleep apnea that is not completely controlled. All of these issues may worsen his fatigue and cognitive issues. The patient remains extremely inactive, which may exacerbate his fatigue issues. I have recommended that he get into a daily regimen of  exercise using a swimming pool to minimize stress on the low back. The patient has a swimming pool at his house, but he does not get in the pool. The patient will followup in about 6 months. He will remain on his current dose of Aricept. He gets carbamazepine through the [facility], but his current prescription has not arrived yet, I wrote a one-month prescription for this, this is a one-time only prescription.  Jill Alexanders MD 04/23/2014 9:10 PM  [facility] [address] [place], [place] [address]  Phone [phone] Fax [phone]

## 2014-04-24 ENCOUNTER — Telehealth: Payer: Self-pay | Admitting: Neurology

## 2014-04-24 MED ORDER — CARBAMAZEPINE 200 MG PO TABS: ORAL_TABLET | ORAL | Status: DC

## 2014-04-24 NOTE — Telephone Encounter (Signed)
Spouse requesting Rx for carbamazepine (TEGRETOL) 200 MG tablet sent to [facility].  Please call and advise.  Thanks

## 2014-04-24 NOTE — Telephone Encounter (Signed)
OV note says: He gets carbamazepine through the [facility], but his current prescription has not arrived yet, I wrote a one-month prescription for this, this is a one-time only prescription. Rx has been sent one time only.  I called the patient back.  They are aware.

## 2014-05-03 ENCOUNTER — Telehealth: Payer: Self-pay | Admitting: Neurology

## 2014-05-03 NOTE — Telephone Encounter (Signed)
Received a phone call from pt's wife who is very angry about still not being contacted regarding ONO setup.  Pt expresses frustration and says that she is tired of this office altogether.  I have advised the patient that in the future, if their physician orders a procedure and they have not heard anything within 3 to 5 days, as a backup, they should always call the office and follow up.  Pt's order for an ONO was submitted on 04/02/2014, I will contact [facility] as soon as possible for an urgent rush setup.  In the meantime, to prevent this from happening again, I have advised the pt's wife to make sure that she calls the sleep lab personally if she has not heard from [facility] by Wednesday.

## 2014-05-07 NOTE — Telephone Encounter (Signed)
Ordered ONO on  04-04-14 ,  Please check why this would have not been done yet. CD

## 2014-05-08 ENCOUNTER — Encounter: Payer: Self-pay | Admitting: Neurology

## 2014-05-08 ENCOUNTER — Other Ambulatory Visit: Payer: Self-pay | Admitting: Neurology

## 2014-05-08 DIAGNOSIS — R0902 Hypoxemia: Secondary | ICD-10-CM

## 2014-05-28 ENCOUNTER — Telehealth: Payer: Self-pay | Admitting: Neurology

## 2014-05-28 DIAGNOSIS — Z5181 Encounter for therapeutic drug level monitoring: Secondary | ICD-10-CM

## 2014-05-28 NOTE — Telephone Encounter (Signed)
States husband had a seizure. Wants someone to call her back. I asked her if something was going on that she states he is sleep now but she wants to speak with someone. Please call

## 2014-05-28 NOTE — Telephone Encounter (Signed)
Patient had a seizure about 7:15 this morning. This was about 45 minutes after taking his morning medications. He was hard to arouse, he was not wearing CPAP machine. He did not bite his tongue or lips nor did he wet himself. He woke up several hours later and was weak. He is asleep again now.

## 2014-05-28 NOTE — Telephone Encounter (Signed)
I called patient. I talked with the wife. The patient had an event of jerking lasting several minutes this morning. He has not missed a dose of carbamazepine which she gets through the [facility]. He does not sleep well, not on CPAP. I will have patient come in for a carbamazepine level, may need to readjust the dose. The last known seizure was 12 months ago.

## 2014-06-04 ENCOUNTER — Telehealth: Payer: Self-pay | Admitting: Neurology

## 2014-06-04 NOTE — Telephone Encounter (Signed)
Patient's wife requesting a return call with questions about findings of MRI don 10/2012. She wants to know if it showed that he had 2 strokes?w

## 2014-06-04 NOTE — Telephone Encounter (Signed)
Spouse questioning if MRI completed on Jan 2014, show evidence of 2 strokes?  One in central part and back of brain? Please call anytime and if not available call cell [phone], can leave message on voice mail.

## 2014-06-04 NOTE — Telephone Encounter (Signed)
I called the patient, talked with the wife. The patient was recently in the hospital at the [facility] in [place], apparently had some medication overdose problem. The patient is doing better now. 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 It is not clear whether these are new or not. The wife indicates that the patient will be followed through the [facility] hospital as the [facility] apparently was somewhat upset that other doctors outside the [facility] [place] system or managing his medical illnesses.

## 2014-07-26 ENCOUNTER — Other Ambulatory Visit: Payer: Self-pay

## 2014-10-24 ENCOUNTER — Ambulatory Visit: Payer: Medicare Other | Admitting: Adult Health

## 2015-05-06 ENCOUNTER — Ambulatory Visit: Payer: Medicare Other | Admitting: Adult Health

## 2015-05-12 ENCOUNTER — Ambulatory Visit: Payer: Self-pay | Admitting: Adult Health

## 2015-09-10 ENCOUNTER — Encounter (HOSPITAL_COMMUNITY): Payer: Self-pay | Admitting: *Deleted

## 2015-09-10 ENCOUNTER — Inpatient Hospital Stay (HOSPITAL_COMMUNITY): Payer: Medicare Other

## 2015-09-10 ENCOUNTER — Inpatient Hospital Stay (HOSPITAL_COMMUNITY)
Admission: EM | Admit: 2015-09-10 | Discharge: 2015-09-11 | DRG: 069 | Disposition: A | Discharge status: Home / Self Care | Payer: Medicare Other | Attending: Internal Medicine | Admitting: Internal Medicine

## 2015-09-10 ENCOUNTER — Emergency Department (HOSPITAL_COMMUNITY): Payer: Medicare Other

## 2015-09-10 DIAGNOSIS — M1990 Unspecified osteoarthritis, unspecified site: Secondary | ICD-10-CM | POA: Diagnosis present

## 2015-09-10 DIAGNOSIS — Z8673 Personal history of transient ischemic attack (TIA), and cerebral infarction without residual deficits: Secondary | ICD-10-CM | POA: Diagnosis not present

## 2015-09-10 DIAGNOSIS — G3183 Dementia with Lewy bodies: Secondary | ICD-10-CM | POA: Diagnosis present

## 2015-09-10 DIAGNOSIS — N289 Disorder of kidney and ureter, unspecified: Secondary | ICD-10-CM

## 2015-09-10 DIAGNOSIS — N183 Chronic kidney disease, stage 3 (moderate): Secondary | ICD-10-CM | POA: Diagnosis present

## 2015-09-10 DIAGNOSIS — R0902 Hypoxemia: Secondary | ICD-10-CM | POA: Diagnosis present

## 2015-09-10 DIAGNOSIS — F329 Major depressive disorder, single episode, unspecified: Secondary | ICD-10-CM | POA: Diagnosis present

## 2015-09-10 DIAGNOSIS — F0150 Vascular dementia without behavioral disturbance: Secondary | ICD-10-CM | POA: Diagnosis present

## 2015-09-10 DIAGNOSIS — G2581 Restless legs syndrome: Secondary | ICD-10-CM | POA: Diagnosis present

## 2015-09-10 DIAGNOSIS — I129 Hypertensive chronic kidney disease with stage 1 through stage 4 chronic kidney disease, or unspecified chronic kidney disease: Secondary | ICD-10-CM | POA: Diagnosis present

## 2015-09-10 DIAGNOSIS — Z87891 Personal history of nicotine dependence: Secondary | ICD-10-CM | POA: Diagnosis not present

## 2015-09-10 DIAGNOSIS — Z79891 Long term (current) use of opiate analgesic: Secondary | ICD-10-CM | POA: Diagnosis not present

## 2015-09-10 DIAGNOSIS — R1310 Dysphagia, unspecified: Secondary | ICD-10-CM | POA: Diagnosis present

## 2015-09-10 DIAGNOSIS — I739 Peripheral vascular disease, unspecified: Secondary | ICD-10-CM | POA: Diagnosis present

## 2015-09-10 DIAGNOSIS — R29810 Facial weakness: Secondary | ICD-10-CM | POA: Diagnosis present

## 2015-09-10 DIAGNOSIS — Z79899 Other long term (current) drug therapy: Secondary | ICD-10-CM | POA: Diagnosis not present

## 2015-09-10 DIAGNOSIS — G8929 Other chronic pain: Secondary | ICD-10-CM | POA: Diagnosis present

## 2015-09-10 DIAGNOSIS — R531 Weakness: Secondary | ICD-10-CM

## 2015-09-10 DIAGNOSIS — G4730 Sleep apnea, unspecified: Secondary | ICD-10-CM | POA: Diagnosis present

## 2015-09-10 DIAGNOSIS — Z66 Do not resuscitate: Secondary | ICD-10-CM | POA: Diagnosis present

## 2015-09-10 DIAGNOSIS — Z981 Arthrodesis status: Secondary | ICD-10-CM

## 2015-09-10 DIAGNOSIS — E039 Hypothyroidism, unspecified: Secondary | ICD-10-CM | POA: Diagnosis present

## 2015-09-10 DIAGNOSIS — Z85828 Personal history of other malignant neoplasm of skin: Secondary | ICD-10-CM | POA: Diagnosis not present

## 2015-09-10 DIAGNOSIS — F0280 Dementia in other diseases classified elsewhere without behavioral disturbance: Secondary | ICD-10-CM | POA: Diagnosis present

## 2015-09-10 DIAGNOSIS — F419 Anxiety disorder, unspecified: Secondary | ICD-10-CM | POA: Diagnosis present

## 2015-09-10 DIAGNOSIS — G459 Transient cerebral ischemic attack, unspecified: Secondary | ICD-10-CM | POA: Diagnosis present

## 2015-09-10 LAB — CBC WITH DIFFERENTIAL/PLATELET
Basophils Absolute: 0.1 10*3/uL (ref 0.0–0.1)
Basophils Relative: 1 %
Eosinophils Absolute: 0.1 10*3/uL (ref 0.0–0.7)
Eosinophils Relative: 2 %
HCT: 40 % (ref 39.0–52.0)
Hemoglobin: 14.2 g/dL (ref 13.0–17.0)
Lymphocytes Relative: 18 %
Lymphs Abs: 1.6 10*3/uL (ref 0.7–4.0)
MCH: 29.8 pg (ref 26.0–34.0)
MCHC: 35.5 g/dL (ref 30.0–36.0)
MCV: 83.9 fL (ref 78.0–100.0)
Monocytes Absolute: 0.5 10*3/uL (ref 0.1–1.0)
Monocytes Relative: 6 %
Neutro Abs: 6.5 10*3/uL (ref 1.7–7.7)
Neutrophils Relative %: 73 %
Platelets: 111 10*3/uL — ABNORMAL LOW (ref 150–400)
RBC: 4.77 MIL/uL (ref 4.22–5.81)
RDW: 12.6 % (ref 11.5–15.5)
WBC: 8.9 10*3/uL (ref 4.0–10.5)

## 2015-09-10 LAB — URINALYSIS, ROUTINE W REFLEX MICROSCOPIC
Bilirubin Urine: NEGATIVE
Glucose, UA: NEGATIVE mg/dL
Hgb urine dipstick: NEGATIVE
Ketones, ur: NEGATIVE mg/dL
Leukocytes, UA: NEGATIVE
Nitrite: NEGATIVE
Protein, ur: NEGATIVE mg/dL
Specific Gravity, Urine: 1.012 (ref 1.005–1.030)
pH: 8 (ref 5.0–8.0)

## 2015-09-10 LAB — VALPROIC ACID LEVEL: Valproic Acid Lvl: 61 ug/mL (ref 50.0–100.0)

## 2015-09-10 LAB — COMPREHENSIVE METABOLIC PANEL
ALT: 43 U/L (ref 17–63)
AST: 38 U/L (ref 15–41)
Albumin: 3.5 g/dL (ref 3.5–5.0)
Alkaline Phosphatase: 61 U/L (ref 38–126)
Anion gap: 9 (ref 5–15)
BUN: 16 mg/dL (ref 6–20)
CO2: 28 mmol/L (ref 22–32)
Calcium: 8.6 mg/dL — ABNORMAL LOW (ref 8.9–10.3)
Chloride: 97 mmol/L — ABNORMAL LOW (ref 101–111)
Creatinine, Ser: 1.72 mg/dL — ABNORMAL HIGH (ref 0.61–1.24)
GFR calc Af Amer: 47 mL/min — ABNORMAL LOW (ref >60)
GFR calc non Af Amer: 41 mL/min — ABNORMAL LOW (ref >60)
Glucose, Bld: 96 mg/dL (ref 65–99)
Potassium: 4.3 mmol/L (ref 3.5–5.1)
Sodium: 134 mmol/L — ABNORMAL LOW (ref 135–145)
Total Bilirubin: 1 mg/dL (ref 0.3–1.2)
Total Protein: 5.5 g/dL — ABNORMAL LOW (ref 6.5–8.1)

## 2015-09-10 LAB — CARBAMAZEPINE LEVEL, TOTAL: Carbamazepine Lvl: <2 ug/mL — ABNORMAL LOW (ref 4.0–12.0)

## 2015-09-10 LAB — TROPONIN I: Troponin I: <0.03 ng/mL (ref <0.031)

## 2015-09-10 LAB — TSH: TSH: 0.189 u[IU]/mL — ABNORMAL LOW (ref 0.350–4.500)

## 2015-09-10 MED ORDER — LORAZEPAM 2 MG/ML IJ SOLN: 1.0000 mg | Freq: Three times a day (TID) | INTRAMUSCULAR | Status: DC

## 2015-09-10 MED ORDER — HYDROMORPHONE HCL 1 MG/ML IJ SOLN
1.0000 mg | Freq: Once | INTRAMUSCULAR | Status: AC
  Administered 2015-09-10: 1 mg via INTRAVENOUS
  Filled 2015-09-10: qty 1

## 2015-09-10 MED ORDER — LORAZEPAM 2 MG/ML IJ SOLN
1.0000 mg | Freq: Three times a day (TID) | INTRAMUSCULAR | Status: DC | PRN
Start: 2015-09-10 — End: 2015-09-11
  Administered 2015-09-10 – 2015-09-11 (×2): 1 mg via INTRAVENOUS
  Filled 2015-09-10 (×2): qty 1

## 2015-09-10 MED ORDER — HEPARIN SODIUM (PORCINE) 5000 UNIT/ML IJ SOLN
5000.0000 [IU] | Freq: Three times a day (TID) | INTRAMUSCULAR | Status: DC
  Administered 2015-09-10 – 2015-09-11 (×4): 5000 [IU] via SUBCUTANEOUS
  Filled 2015-09-10 (×4): qty 1

## 2015-09-10 MED ORDER — ONDANSETRON HCL 4 MG/2ML IJ SOLN: 4.0000 mg | Freq: Four times a day (QID) | INTRAMUSCULAR | Status: DC | PRN

## 2015-09-10 MED ORDER — HYDROMORPHONE HCL 1 MG/ML IJ SOLN
1.0000 mg | INTRAMUSCULAR | Status: DC | PRN
  Administered 2015-09-10 – 2015-09-11 (×2): 1 mg via INTRAVENOUS
  Filled 2015-09-10 (×2): qty 1

## 2015-09-10 MED ORDER — ATORVASTATIN CALCIUM 80 MG PO TABS: 80.0000 mg | ORAL_TABLET | Freq: Every day | ORAL | Status: DC

## 2015-09-10 MED ORDER — VALPROATE SODIUM 500 MG/5ML IV SOLN
500.0000 mg | Freq: Two times a day (BID) | INTRAVENOUS | Status: DC
  Administered 2015-09-10 – 2015-09-11 (×2): 500 mg via INTRAVENOUS
  Filled 2015-09-10 (×3): qty 5

## 2015-09-10 MED ORDER — LEVOTHYROXINE SODIUM 100 MCG IV SOLR
56.0000 ug | Freq: Every day | INTRAVENOUS | Status: DC
  Administered 2015-09-11: 56 ug via INTRAVENOUS
  Filled 2015-09-10: qty 5

## 2015-09-10 MED ORDER — HYDROMORPHONE HCL 1 MG/ML IJ SOLN
1.0000 mg | INTRAMUSCULAR | Status: AC | PRN
Start: 2015-09-10 — End: 2015-09-10
  Administered 2015-09-10: 1 mg via INTRAVENOUS
  Filled 2015-09-10: qty 1

## 2015-09-10 MED ORDER — SENNOSIDES-DOCUSATE SODIUM 8.6-50 MG PO TABS: 1.0000 | ORAL_TABLET | Freq: Every evening | ORAL | Status: DC | PRN

## 2015-09-10 MED ORDER — ASPIRIN 325 MG PO TABS
325.0000 mg | ORAL_TABLET | Freq: Once | ORAL | Status: DC
  Filled 2015-09-10: qty 1

## 2015-09-10 MED ORDER — ASPIRIN 81 MG PO CHEW
81.0000 mg | CHEWABLE_TABLET | Freq: Every day | ORAL | Status: DC
  Administered 2015-09-11: 81 mg via ORAL
  Filled 2015-09-10: qty 1

## 2015-09-10 NOTE — ED Notes (Signed)
Pt c/o chronic back pain, knee pain, and neck pain.  NS paging admitting MD for orders.  MRI called questioning if pt has stents and family states he does not.  They will be coming to get him shortly for MRI.

## 2015-09-10 NOTE — ED Notes (Signed)
Admitting MD notified of pt requesting pain medication and that pt is unable to pass swallow screen due to history of dysphagia.

## 2015-09-10 NOTE — ED Notes (Signed)
Patient requesting pain medication, MD notified

## 2015-09-10 NOTE — ED Notes (Signed)
Returned from MRI 

## 2015-09-10 NOTE — ED Notes (Signed)
Remains in MRI 

## 2015-09-10 NOTE — ED Provider Notes (Addendum)
CSN: [id number]     Arrival date & time 09/10/15  [id number] History   First MD Initiated Contact with Patient 09/10/15 [phone]     Chief Complaint  Patient presents with  . Facial Droop  . Weakness     (Consider location/radiation/quality/duration/timing/severity/associated sxs/prior Treatment) The history is provided by the patient.   patient presented complaining of generalized weakness. States he does feel bad all over. States he's been more weak and has had more difficulty walking. He does walk with a cane or walker at baseline. Wife reportedly called today to because he was weak and had a facial droop. Patient denies fever. States he hurts in his back which is not unusual for him. No recent change in medications. Has a history of dementia, possibly Lewy body. He was reportedly seen at the [facility] yesterday and had what sounds like an echocardiogram done. May have been concerned with new onset CHF.  Past Medical History  Diagnosis Date  . Skin cancer     basal cell on back x2  . Lewy body dementia   . Dyspnea   . Dizziness   . Orthostatic hypotension   . Anxiety   . Depression   . Hypothyroidism   . Osgood-Schlatter's disease   . Back pain     chronic  . Degenerative disc disease   . Arthritis   . Tachycardia     heart monitor last week for 48 hours no report yet  . Jaundice with stoppage of bile flow     after stent romoved  . Elevated LFTs   . Sleep apnea with use of continuous positive airway pressure (CPAP)   . Memory deficits 04/23/2013  . Chronic kidney disease     stage III  renal disease   . Restless leg syndrome 04/23/2013  . Dupuytren's contracture of right hand   . Erectile dysfunction    Past Surgical History  Procedure Laterality Date  . Gallbladder surgery  2007-2008  . Basal cell carcinoma excision  [id number]  . Lumbar fusion  1996  . Cervical fusion  1985  . Carpal tunnel release  2003  . Tonsillectomy    . Appendectomy    . Cholecystectomy    . Liver biopsy   12/27/2011    Procedure: LIVER BIOPSY;  Surgeon: Inda Castle, MD;  Location: [facility];  Service: Endoscopy;  Laterality: N/A;  pt moved from 3/22 to 3/18 ( aw)   Family History  Problem Relation Age of Onset  . Coronary artery disease Mother   . Heart attack Mother     stents in neck and heart ?  Marland Kitchen Crohn's disease Mother   . Cirrhosis Sister   . Cirrhosis Father   . Prostate cancer Maternal Uncle   . Liver cancer Cousin   . Colitis Daughter   . Diabetes Maternal Uncle   . Diabetes Brother   . Kidney disease Maternal Aunt   . Kidney disease Cousin    Social History  Substance Use Topics  . Smoking status: Former Smoker    Quit date: 12/27/1975  . Smokeless tobacco: Never Used  . Alcohol Use: No    Review of Systems  Unable to perform ROS: Dementia  Constitutional: Negative for diaphoresis.  Respiratory: Negative for shortness of breath.   Cardiovascular: Negative for chest pain.  Gastrointestinal: Negative for abdominal pain.  Genitourinary: Negative for flank pain.  Musculoskeletal: Positive for back pain.  Skin: Negative for wound.  Neurological: Positive for weakness.  Hematological: Negative  for adenopathy.  Psychiatric/Behavioral: Negative for confusion.      Allergies  Vesicare; Aricept; Lamictal; Namenda; Oxycodone hcl; Sulfur; Exelon; Fentanyl; and Penicillins  Home Medications   Prior to Admission medications   Medication Sig Start Date End Date Taking? Authorizing Provider  ALPRAZolam Duanne Moron) 1 MG tablet Take 1 mg by mouth 2 (two) times daily.    Yes Historical Provider, MD  divalproex (DEPAKOTE) 500 MG DR tablet Take 500 mg by mouth 2 (two) times daily.   Yes Historical Provider, MD  hydrochlorothiazide (HYDRODIURIL) 25 MG tablet Take 25 mg by mouth daily.   Yes Historical Provider, MD  hydrOXYzine (ATARAX/VISTARIL) 10 MG tablet Take 10 mg by mouth 3 (three) times daily as needed for itching.   Yes Historical Provider, MD  levothyroxine  (SYNTHROID, LEVOTHROID) 112 MCG tablet Take 112 mcg by mouth daily before breakfast.   Yes Historical Provider, MD  loperamide (IMODIUM A-D) 2 MG tablet Take 2 mg by mouth 4 (four) times daily as needed for diarrhea or loose stools.   Yes Historical Provider, MD  ondansetron (ZOFRAN) 8 MG tablet Take 8 mg by mouth every 8 (eight) hours as needed for nausea or vomiting.   Yes Historical Provider, MD  Oxycodone HCl 20 MG TABS Take 1 tablet by mouth 3 (three) times daily as needed. For pain   Yes Historical Provider, MD  pantoprazole (PROTONIX) 40 MG tablet Take 40 mg by mouth at bedtime.   Yes Historical Provider, MD  pregabalin (LYRICA) 50 MG capsule Take 50 mg by mouth 3 (three) times daily.   Yes Historical Provider, MD  carbamazepine (TEGRETOL) 200 MG tablet One tablet in the morning and two tablets in the evening Patient not taking: Reported on 09/10/2015 04/24/14   Kathrynn Ducking, MD  clonazePAM (KLONOPIN) 1 MG tablet Take 1 mg by mouth daily. 1 tab @ 1 pm    Historical Provider, MD  cyclobenzaprine (FLEXERIL) 10 MG tablet Take 10 mg by mouth 3 (three) times daily.     Historical Provider, MD  donepezil (ARICEPT) 5 MG tablet Take 5 mg by mouth at bedtime.    Historical Provider, MD  fish oil-omega-3 fatty acids 1000 MG capsule Take 1 g by mouth daily.    Historical Provider, MD  morphine (MS CONTIN) 60 MG 12 hr tablet Take 60 mg by mouth 3 (three) times daily.    Historical Provider, MD  omeprazole (PRILOSEC) 20 MG capsule Take 20 mg by mouth 2 (two) times daily before a meal.     Historical Provider, MD  oxyCODONE (ROXICODONE) 15 MG immediate release tablet Take 15 mg by mouth every 6 (six) hours.    Historical Provider, MD  ziprasidone (GEODON) 60 MG capsule Take 180 mg by mouth every evening.     Historical Provider, MD   BP 132/73 mmHg  Pulse 85  Temp(Src) 100.4 F (38 C) (Rectal)  Resp 17  Ht 5\' 8"  (1.727 m)  SpO2 97% Physical Exam  Constitutional: He appears well-developed.   HENT:  Head: Atraumatic.  Mild lower left facial droop. Face slightly asymmetric. Does have relatively equal smile. Able to open both eyes and close both eyes equally.  Neck: Neck supple.  Cardiovascular: Normal rate.   Pulmonary/Chest: Effort normal.  Few scattered rales  Abdominal: Soft.  Musculoskeletal: Normal range of motion.  Neurological: He is alert.   = straight leg raise bilaterally. Good grip strength and flexion extension and upper extremities.  Skin: Skin is warm.  ED Course  Procedures (including critical care time) Labs Review Labs Reviewed  CARBAMAZEPINE LEVEL, TOTAL - Abnormal; Notable for the following:    Carbamazepine Lvl <2.0 (*)    All other components within normal limits  COMPREHENSIVE METABOLIC PANEL - Abnormal; Notable for the following:    Sodium 134 (*)    Chloride 97 (*)    Creatinine, Ser 1.72 (*)    Calcium 8.6 (*)    Total Protein 5.5 (*)    GFR calc non Af Amer 41 (*)    GFR calc Af Amer 47 (*)    All other components within normal limits  CBC WITH DIFFERENTIAL/PLATELET - Abnormal; Notable for the following:    Platelets 111 (*)    All other components within normal limits  VALPROIC ACID LEVEL  URINALYSIS, ROUTINE W REFLEX MICROSCOPIC (NOT AT [facility])  TROPONIN I    Imaging Review Ct Head Wo Contrast  09/10/2015  CLINICAL DATA:  Confusion. Left facial droop. Report of several recent falls. EXAM: CT HEAD WITHOUT CONTRAST TECHNIQUE: Contiguous axial images were obtained from the base of the skull through the vertex without intravenous contrast. COMPARISON:  Head CT October 08, 2015; brain MRI October 13, 2012 FINDINGS: There is mild diffuse atrophy. There is no intracranial mass, hemorrhage, extra-axial fluid collection, or midline shift. There is slight small vessel disease in the centra semiovale bilaterally. There is evidence of a prior small lacunar infarct in the mid right extreme capsule. Elsewhere gray-white compartments appear normal.  There is no acute infarct evident. The bony calvarium appears intact. Mastoids are hypoplastic but clear bilaterally. Orbits appear symmetric and normal bilaterally. IMPRESSION: Mild atrophy with slight periventricular small vessel disease. Prior small lacunar infarct in the midportion of the right extreme capsule. No intracranial mass, hemorrhage, or evidence suggesting acute infarct. Electronically Signed   By: Lowella Grip III M.D.   On: 09/10/2015 11:30   Dg Chest Portable 1 View  09/10/2015  CLINICAL DATA:  64 year old male with weakness, chest pain and shortness of breath EXAM: PORTABLE CHEST 1 VIEW COMPARISON:  Prior chest x-ray 10/07/2012 FINDINGS: Stable cardiac and mediastinal contours. The lungs are clear. No pulmonary edema, pleural effusion, focal airspace consolidation or pneumothorax. No acute osseous abnormality. IMPRESSION: No active disease. Electronically Signed   By: Jacqulynn Cadet M.D.   On: 09/10/2015 12:12   I have personally reviewed and evaluated these images and lab results as part of my medical decision-making.   EKG Interpretation   Date/Time:  Wednesday September 10 2015 09:40:47 EST Ventricular Rate:  100 PR Interval:  138 QRS Duration: 88 QT Interval:  318 QTC Calculation: 410 R Axis:   39 Text Interpretation:  Sinus tachycardia Borderline T abnormalities,  diffuse leads Baseline wander in lead(s) V1 Confirmed by Alvino Chapel  MD,  Kenslee Achorn [phone]) on 09/10/2015 10:13:50 AM      MDM   Final diagnoses:  Facial droop  Weakness  Renal insufficiency    Patient presented with generalized weakness. Has mildly elevated creatinine but unsure of baseline. I was able to obtain more information the patient's family. Apparently had more confusion this morning. Had more episodes of apnea on his cPap last night and had sats 48%. Had a left-sided facial droop when he woke up. Had some facial droop upon arrival but is improved somewhat. Has low-grade temperature but  no clear cause. Continued weakness. Family requests transfer [facility]. Since around 1:00 today we have been working on transfer. Dr Romero Liner is pcp and Dr.  Stefanek is neurologist.    Davonna Belling, MD 09/10/15 [place] transfer center states that they think it would be better for the patient's stay here since if he is a stroke rule out. We'll transfer him anyway. Discussed with internal medicine clinic and I will see the patient.  Davonna Belling, MD 09/10/15 1440

## 2015-09-10 NOTE — ED Notes (Signed)
Attempted report 

## 2015-09-10 NOTE — Progress Notes (Signed)
Patient trasfered from ED to [phone] via stretcher; alert and oriented x 2; complaints of pain in his back (medicated in ED); IV saline locked in [place]; skin assessed by Alphonsus Sias and Sheppard Evens. Orient patient to room and unit;gave patient care guide; instructed how to use the call bell and  fall risk precautions. Family at bedside. Will continue to monitor the patient.

## 2015-09-10 NOTE — ED Notes (Addendum)
Transported to MRI

## 2015-09-10 NOTE — H&P (Signed)
Date: 09/10/2015               Patient Name:  Hayden Rose MRN: [id number]  DOB: [birth year] Age / Sex: 64 y.o., male   PCP: No primary care provider on file.              Medical Service: Internal Medicine Teaching Service              Attending Physician: Dr. Aldine Contes, MD    First Contact: Rogelia Boga, [place] 3 Pager: [phone]  Second Contact: Dr. Charlynn Grimes Pager: [phone]  Third Contact Dr. Randell Patient Pager: [phone]       After Hours (After 5p/  First Contact Pager: [phone]  weekends / holidays): Second Contact Pager: [phone]   Chief Complaint: Facial droop  History of Present Illness: Lynne Vallas is a 64yo man with a PMH of vascular dementia, anxiety, depression, chronic pain back pain, stage II-III CKD, PAD, sleep apnea, and hypothyroidism who presents with increased confusion, a facial droop and complaints of generalized weakness.  History provided by the patient's wife and daughter. This morning his wife reports that she found him shaking in bed. He was talking throughout the shaking, however his wife reports that his speech was slurred. The episode lasted <2 minutes. He wears adult diapers at baseline, but she reports that after the shaking episode his diaper was slightly wet. He did not bite his tongue but endorsed some jaw pain afterwards. He had increased confusion around this episode and did not know where he was or if it was day or night, both immediately after the shaking and in the emergency department. Immediately after the shaking episode he had a left-sided facial droop which subsequently resolved, and he walked with a shuffling gait. Per his wife he walks with a walker at baseline, and only to the bathroom. He has had decreased activity and increased daytime sleepiness in the past few days. He uses a CPAP machine at home which monitors the number of apneic events he has overnight and his wife reports that last night it was 57, which is increased compared to his baseline. She  also reports that he had hypoxia when she measured his vitals with a pulse ox of 52. His records report a possible seizure disorder vs REM movement disorder. His wife reports that he last had a seizure in 2015.   Patient complains that he is cold and per his wife, he has difficulty regulating his temperature after a prior stroke. He recently he has complained intermittently of chest pain. He denies current chest pain or SOB. He has also had LE swelling and right upper extremity swelling which she reports is slightly better now. He has had a weight gain of 25 1/2 lb in under 4 months. He has no history of HF and does not currently take any diuretic medication aside from hydrochlorothiazide for HTN. The patient's wife reports that he had an echo yesterday at the [place], though she does not yet know the results. He takes a daily aspirin but does not take a statin.   He currently is seen at the [facility] for all his care.   Meds: Current Facility-Administered Medications  Medication Dose Route Frequency Provider Last Rate Last Dose  . [START ON 09/11/2015] aspirin chewable tablet 81 mg  81 mg Oral Daily Milagros Loll, MD      . aspirin tablet 325 mg  325 mg Oral Once Milagros Loll, MD      .  heparin injection 5,000 Units  5,000 Units Subcutaneous 3 times per day Milagros Loll, MD      . senna-docusate (Senokot-S) tablet 1 tablet  1 tablet Oral QHS PRN Milagros Loll, MD       Current Outpatient Prescriptions  Medication Sig Dispense Refill  . ALPRAZolam (XANAX) 1 MG tablet Take 1 mg by mouth 2 (two) times daily.     . divalproex (DEPAKOTE) 500 MG DR tablet Take 500 mg by mouth 2 (two) times daily.    . hydrochlorothiazide (HYDRODIURIL) 25 MG tablet Take 25 mg by mouth daily.    . hydrOXYzine (ATARAX/VISTARIL) 10 MG tablet Take 10 mg by mouth 3 (three) times daily as needed for itching.    . levothyroxine (SYNTHROID, LEVOTHROID) 112 MCG tablet Take 112 mcg by mouth daily before breakfast.    .  loperamide (IMODIUM A-D) 2 MG tablet Take 2 mg by mouth 4 (four) times daily as needed for diarrhea or loose stools.    . ondansetron (ZOFRAN) 8 MG tablet Take 8 mg by mouth every 8 (eight) hours as needed for nausea or vomiting.    . Oxycodone HCl 20 MG TABS Take 1 tablet by mouth 3 (three) times daily as needed. For pain    . pantoprazole (PROTONIX) 40 MG tablet Take 40 mg by mouth at bedtime.    . pregabalin (LYRICA) 50 MG capsule Take 50 mg by mouth 3 (three) times daily.    . carbamazepine (TEGRETOL) 200 MG tablet One tablet in the morning and two tablets in the evening (Patient not taking: Reported on 09/10/2015) 90 tablet 0  . clonazePAM (KLONOPIN) 1 MG tablet Take 1 mg by mouth daily. 1 tab @ 1 pm    . cyclobenzaprine (FLEXERIL) 10 MG tablet Take 10 mg by mouth 3 (three) times daily.     Marland Kitchen donepezil (ARICEPT) 5 MG tablet Take 5 mg by mouth at bedtime.    . fish oil-omega-3 fatty acids 1000 MG capsule Take 1 g by mouth daily.    Marland Kitchen morphine (MS CONTIN) 60 MG 12 hr tablet Take 60 mg by mouth 3 (three) times daily.    Marland Kitchen omeprazole (PRILOSEC) 20 MG capsule Take 20 mg by mouth 2 (two) times daily before a meal.     . oxyCODONE (ROXICODONE) 15 MG immediate release tablet Take 15 mg by mouth every 6 (six) hours.    . ziprasidone (GEODON) 60 MG capsule Take 180 mg by mouth every evening.       Allergies: Allergies as of 09/10/2015 - Review Complete 09/10/2015  Allergen Reaction Noted  . Vesicare [solifenacin succinate] Anaphylaxis 12/08/2011  . Aricept [donepezil hydrochloride] Nausea And Vomiting and Other (See Comments) 12/08/2011  . Lamictal [lamotrigine]  12/10/2011  . Namenda [memantine hcl] Other (See Comments) 12/08/2011  . Oxycodone hcl Other (See Comments) 07/18/2009  . Sulfur Swelling 07/18/2009  . Exelon [rivastigmine] Rash 04/02/2014  . Fentanyl Rash 04/02/2014  . Penicillins Rash 07/18/2009   Past Medical History  Diagnosis Date  . Skin cancer     basal cell on back x2  .  Lewy body dementia   . Dyspnea   . Dizziness   . Orthostatic hypotension   . Anxiety   . Depression   . Hypothyroidism   . Osgood-Schlatter's disease   . Back pain     chronic  . Degenerative disc disease   . Arthritis   . Tachycardia     heart monitor last week for 48  hours no report yet  . Jaundice with stoppage of bile flow     after stent romoved  . Elevated LFTs   . Sleep apnea with use of continuous positive airway pressure (CPAP)   . Memory deficits 04/23/2013  . Chronic kidney disease     stage III  renal disease   . Restless leg syndrome 04/23/2013  . Dupuytren's contracture of right hand   . Erectile dysfunction    Past Surgical History  Procedure Laterality Date  . Gallbladder surgery  2007-2008  . Basal cell carcinoma excision  [id number]  . Lumbar fusion  1996  . Cervical fusion  1985  . Carpal tunnel release  2003  . Tonsillectomy    . Appendectomy    . Cholecystectomy    . Liver biopsy  12/27/2011    Procedure: LIVER BIOPSY;  Surgeon: Inda Castle, MD;  Location: [facility];  Service: Endoscopy;  Laterality: N/A;  pt moved from 3/22 to 3/18 ( aw)   Family History  Problem Relation Age of Onset  . Coronary artery disease Mother   . Heart attack Mother     stents in neck and heart ?  Marland Kitchen Crohn's disease Mother   . Cirrhosis Sister   . Cirrhosis Father   . Prostate cancer Maternal Uncle   . Liver cancer Cousin   . Colitis Daughter   . Diabetes Maternal Uncle   . Diabetes Brother   . Kidney disease Maternal Aunt   . Kidney disease Cousin    Social History   Social History  . Marital Status: Married    Spouse Name: Barnetta Chapel  . Number of Children: 2  . Years of Education: 9th   Occupational History  . disabled    Social History Main Topics  . Smoking status: Former Smoker    Quit date: 12/27/1975  . Smokeless tobacco: Never Used  . Alcohol Use: No  . Drug Use: No  . Sexual Activity: Yes    Birth Control/ Protection: Condom   Other  Topics Concern  . Not on file   Social History Narrative   Patient is married Barnetta Chapel) and lives at home with his wife and his mother.   Patient is disabled.   Patient has a 9th grade education.   Patient is right-handed.   Patient does not drink any caffeine.   Patient has two adult childre   Does not regularly exercise.     Review of Systems: Constitutional: Positive for low grade fever, chills, weight gain. HEENT: Negative for runny nose.  Cadriovascular: Negative for chest pain. Respiratory: Negative for cough, SOB. GI: Negative for abdominal pain, nausea, vomiting, constipation. Positive for decreased appetite per family, but patient reports not eating to lose weight. MSK: Positive for back pain.  Physical Exam: Blood pressure 112/62, pulse 74, temperature 100.4 F (38 C), temperature source Rectal, resp. rate 11, height 5\' 8"  (1.727 m), SpO2 99 %. Constitutional: Well nourished man, laying in bed, in NAD Cardiovascular: RRR, no murmurs appreciated Respiratory: Clear to auscultation bilaterally.  Abdomen: Soft, mildly tender to palpation in the LLQ, no rebound, guarding, +BS Extremities: Pulses 2+ symmetric, 1+ LE edema to the knee.  Neuro: A&O x2 (unaware of year). CNII-XII grossly intact. Face symmetric, no facial droop appreciated. Sensation intact to light touch bilaterally. 5/5 strength bilaterally in the upper and lower extremities. Intention tremor bilaterally with finger to nose testing.   Lab results: Troponin <0.03 WBC 8.9 Na 134 Cr 1.72 Carbamasepine level <2.0 Valproic  acid level 61  Imaging results:  Ct Head Wo Contrast  09/10/2015  CLINICAL DATA:  Confusion. Left facial droop. Report of several recent falls. EXAM: CT HEAD WITHOUT CONTRAST TECHNIQUE: Contiguous axial images were obtained from the base of the skull through the vertex without intravenous contrast. COMPARISON:  Head CT October 08, 2015; brain MRI October 13, 2012 FINDINGS: There is mild  diffuse atrophy. There is no intracranial mass, hemorrhage, extra-axial fluid collection, or midline shift. There is slight small vessel disease in the centra semiovale bilaterally. There is evidence of a prior small lacunar infarct in the mid right extreme capsule. Elsewhere gray-white compartments appear normal. There is no acute infarct evident. The bony calvarium appears intact. Mastoids are hypoplastic but clear bilaterally. Orbits appear symmetric and normal bilaterally. IMPRESSION: Mild atrophy with slight periventricular small vessel disease. Prior small lacunar infarct in the midportion of the right extreme capsule. No intracranial mass, hemorrhage, or evidence suggesting acute infarct. Electronically Signed   By: Lowella Grip III M.D.   On: 09/10/2015 11:30   Dg Chest Portable 1 View  09/10/2015  CLINICAL DATA:  64 year old male with weakness, chest pain and shortness of breath EXAM: PORTABLE CHEST 1 VIEW COMPARISON:  Prior chest x-ray 10/07/2012 FINDINGS: Stable cardiac and mediastinal contours. The lungs are clear. No pulmonary edema, pleural effusion, focal airspace consolidation or pneumothorax. No acute osseous abnormality. IMPRESSION: No active disease. Electronically Signed   By: Jacqulynn Cadet M.D.   On: 09/10/2015 12:12    Other results: EKG: sinus tachycardia.  Assessment & Plan by Problem: Active Problems:   Facial droop  Duffie Hampson is a 64yo man with a PMH of vascular dementia, anxiety, depression, chronic pain back pain, stage II-III CKD, PAD, sleep apnea, and hypothyroidism who presents with increased confusion, a facial droop and complaints of generalized weakness. Differential diagnosis includes seizure vs TIA/stroke.  Episode of confusion, shaking and facial droop, now resolved: Differential diagnosis includes seizure vs TIA/stroke. Seizure less likely since the patient was speaking during his episode of shaking. His history of a seizure disorder is also unclear:  previous notes document seizures as events where the patient shakes in his sleep and a 04/23/13 note by Dr. Margette Fast mentions a concern that these events may represent a REM sleep disturbance. I can find no records of an EEG. Patient has a history of a prior lacunar infarct and cerebellar stroke. Presentation more likely consistent with a TIA as opposed to a stroke as patient has no focal deficits on exam and facial droop resolved. Heat CT showed no evidence of hemorrhage or acute infarct.  -Obtain an MRI to rule out stroke. Patient is currently out of the time window for thrombolytic therapy. -Aspirin 325mg . -F/u lipid panel, A1C and TSH. -Urine drug screen. Patient's family admit he has a remote past history of marijuana use.  -Monitor pulse oximetry. Patient currently satting 99% on room air, however wife's history concerning for hypoxia which may have contributed to patient's episode of confusion. -Medication reconciliation with pharmacy. Patient has several medications that could contribute to confusion including alprazolam and hydroxyzine, along with a chronic opioid pain medication regimen.  -IV Depakote 500mg .  -PT/OT to see. Patient's family report he has a high falls risk and that his functional status is markedly decreased due to pain. He spends most days sitting in his chair and requires assistance with bathing at baseline.  -Falls precautions.   LE Edema: CXR showed no pulmonary edema and a stable cardiomediastinal silhouette.  Patient's lungs clear on exam and he endorses no SOB even while laying flat. Patient has a smoking history, HTN and PAD which could indicate an increased risk of ischemic heart cardiomyopathy but it is unlikely that the patient is going through an acute exacerbation at this time.  -F/u on results of patient's echocardiogram.  -Consider starting lasix prior to discharge depending on results.  HTN: Patient normotensive in the ED. Hold home hydrochlorothiazide  pending stroke workup.   Chronic pain: Patient has a history of chronic pain treated with chronic opioids.  -Dilaudid 1mg  IV q4h for pain.   Anxiety, depression: Patient on alprazolam 1mg  TID outpatient.  -Lorazepam 1mg  IV q8h.   Hypothyroid:  -Continue current synthroid dose pending TSH.   Sleep apnea: Continue home CPAP.   Diet: NPO.  Dispo: Admit to medicine service for monitoring and ruleout stroke. Anticipate discharge in 1-2 days.   This is a Careers information officer Note.  The care of the patient was discussed with Dr. Juleen China and the assessment and plan was formulated with their assistance.  Please see their note for official documentation of the patient encounter.   Signed: Rogelia Boga, Med Student 09/10/2015, 3:56 PM

## 2015-09-10 NOTE — ED Notes (Signed)
Information from EMS. The PT wife reported to EMS  That at 0730  Pt was noted to have LT facial droop and was shaky . Pt was last seen at [place] on 09-09-15 for cardiac work up . EMS staff reported Pt had a temp of 100 ,BP 138/88 ,HR 104  And resp 16. PT is A/O  On arrival to room . Pt speaking in full sentences.

## 2015-09-10 NOTE — ED Notes (Signed)
X-ray at bedside

## 2015-09-10 NOTE — H&P (Signed)
Date: 09/10/2015               Patient Name:  Hayden Rose MRN: [id number]  DOB: 1950/11/26 Age / Sex: 64 y.o., male   PCP: No primary care provider on file.         Medical Service: Internal Medicine Teaching Service         Attending Physician: Dr. Aldine Contes, MD    First Contact: Dr. Maryellen Pile Pager: [phone]  Second Contact: Dr. Jacques Earthly Pager: [phone]       After Hours (After 5p/  First Contact Pager: [phone]  weekends / holidays): Second Contact Pager: [phone]   Chief Complaint: facial droop, generalized weakness  History of Present Illness: Hayden Rose is a 64 y.o. male with a past medical history of lacunar and cerebellar CVA, depression, severe sleep apnea, vascular dementia, questionable Lewy Body dementia per chart review (which his wife states he does not have), chronic pain on daily opiates, CKD stage 2-3, peripheral artery disease, hypothyroidism.  Patient presents today with his wife and daughter present in the room due to episode of convulsing, slurred speech, confusion, and left-sided facial droop this morning.  Wife and daughter provided most of the history.  Patients wife reports that she found him this AM convulsing in his bed for about 2-3 minutes.  States he was talking coherently the entire time, but his speech was slurred.  Wife states that the patients adult diapers were minimally wet after the episode but unable to tell if this occurred during the witnessed episode.  There was no tongue biting witnessed, just some jaw pain afterwards.  Wife also states that patient was confused unable to state where he was or whether it was day or night.  After this episode, wife reports patient had a left-sided facial droop that was also present in the ED.  This resolved over the course of time in the ED and was absent upon our evaluation.  Patient reportedly only walks to and from the bathroom with the aid of a walker and was noticed to have more of a  shuffling gait recently according to his wife.  Patient does use a CPAP machine at home which monitors number of apneic events overnight and wife states that last night he had 57 episodes, which is much greater than his baseline.  Wife also checked his BP after episode this morning and it was 157/58, pulse 48 and pulse ox 52%.  States that sometimes after nights of increased episodes, patient has increased confusion and weakness, but today was much worse which prompted the ED visit.     Patient complains of frequently alternating between hot and cold with difficulty regulating his temperature after previous CVA's.  Currently, denies any chest pain or shortness of breath.  No urinary complaints, no constipation, nausea, vomiting, or diarrhea.  Reports bilateral lower extremity swelling, right upper extremity swelling.  Wife reports that he has had a 25 pound weight gain in under 4 months.  No history of CHF but recently had echo done at the [place] to evaluate for CHF.  He is only on HCTZ for HTN.  States he takes a daily aspirin.  Receives all his care at the [place].  Patient is a former smoker and alcoholic.  States quit both in 1977.  Has used medicinal marijuana in the past.   In the ED, patient had facial droop upon arrival that improved.   CBC was unremarkable with exception of platelet  count of 111.  CMP revealed an elevated creatine of 1.72 (unclear what baseline is but was 1.5 two years ago).  Sodium mildly low at 134, Chloride 97.  LFTs were normal.   Carbamazepine level was < 2.0.  Valproic acid level normal.  Temperature was borderline at 100.4.  Urinalysis was clean.  CT head without contrast showed "mild atrophy with slight periventricular small vessel disease. Prior small lacunar infarct in the midportion of the right extreme capsule. No intracranial mass, hemorrhage, or evidence suggesting acute infarct."  Portable chest xray was unremarkable.   Meds: Current Facility-Administered Medications    Medication Dose Route Frequency Provider Last Rate Last Dose  . [START ON 09/11/2015] aspirin chewable tablet 81 mg  81 mg Oral Daily Milagros Loll, MD      . aspirin tablet 325 mg  325 mg Oral Once Milagros Loll, MD      . atorvastatin (LIPITOR) tablet 80 mg  80 mg Oral q1800 Milagros Loll, MD      . heparin injection 5,000 Units  5,000 Units Subcutaneous 3 times per day Milagros Loll, MD   5,000 Units at 09/10/15 1603  . HYDROmorphone (DILAUDID) injection 1 mg  1 mg Intravenous Q4H PRN Milagros Loll, MD      . Derrill Memo ON 09/11/2015] levothyroxine (SYNTHROID, LEVOTHROID) injection 56 mcg  56 mcg Intravenous QAC breakfast Milagros Loll, MD      . LORazepam (ATIVAN) injection 1 mg  1 mg Intravenous Q8H PRN Milagros Loll, MD      . ondansetron [facility]) injection 4 mg  4 mg Intravenous Q6H PRN Milagros Loll, MD      . senna-docusate (Senokot-S) tablet 1 tablet  1 tablet Oral QHS PRN Milagros Loll, MD      . valproate (DEPACON) 500 mg in dextrose 5 % 50 mL IVPB  500 mg Intravenous Q12H Milagros Loll, MD       Current Outpatient Prescriptions  Medication Sig Dispense Refill  . ALPRAZolam (XANAX) 1 MG tablet Take 1 mg by mouth 2 (two) times daily.     . divalproex (DEPAKOTE) 500 MG DR tablet Take 500 mg by mouth 2 (two) times daily.    . hydrochlorothiazide (HYDRODIURIL) 25 MG tablet Take 25 mg by mouth daily.    . hydrOXYzine (ATARAX/VISTARIL) 10 MG tablet Take 10 mg by mouth 3 (three) times daily as needed for itching.    . levothyroxine (SYNTHROID, LEVOTHROID) 112 MCG tablet Take 112 mcg by mouth daily before breakfast.    . loperamide (IMODIUM A-D) 2 MG tablet Take 2 mg by mouth 4 (four) times daily as needed for diarrhea or loose stools.    . ondansetron (ZOFRAN) 8 MG tablet Take 8 mg by mouth every 8 (eight) hours as needed for nausea or vomiting.    . Oxycodone HCl 20 MG TABS Take 1 tablet by mouth 3 (three) times daily as needed. For pain    . pantoprazole  (PROTONIX) 40 MG tablet Take 40 mg by mouth at bedtime.    . pregabalin (LYRICA) 50 MG capsule Take 50 mg by mouth 3 (three) times daily.    . carbamazepine (TEGRETOL) 200 MG tablet One tablet in the morning and two tablets in the evening (Patient not taking: Reported on 09/10/2015) 90 tablet 0  . clonazePAM (KLONOPIN) 1 MG tablet Take 1 mg by mouth daily. 1 tab @ 1 pm    . cyclobenzaprine (FLEXERIL) 10 MG tablet  Take 10 mg by mouth 3 (three) times daily.     Marland Kitchen donepezil (ARICEPT) 5 MG tablet Take 5 mg by mouth at bedtime.    . fish oil-omega-3 fatty acids 1000 MG capsule Take 1 g by mouth daily.    Marland Kitchen morphine (MS CONTIN) 60 MG 12 hr tablet Take 60 mg by mouth 3 (three) times daily.    Marland Kitchen omeprazole (PRILOSEC) 20 MG capsule Take 20 mg by mouth 2 (two) times daily before a meal.     . oxyCODONE (ROXICODONE) 15 MG immediate release tablet Take 15 mg by mouth every 6 (six) hours.    . ziprasidone (GEODON) 60 MG capsule Take 180 mg by mouth every evening.       Allergies: Allergies as of 09/10/2015 - Review Complete 09/10/2015  Allergen Reaction Noted  . Vesicare [solifenacin succinate] Anaphylaxis 12/08/2011  . Aricept [donepezil hydrochloride] Nausea And Vomiting and Other (See Comments) 12/08/2011  . Lamictal [lamotrigine]  12/10/2011  . Namenda [memantine hcl] Other (See Comments) 12/08/2011  . Oxycodone hcl Other (See Comments) 07/18/2009  . Sulfur Swelling 07/18/2009  . Exelon [rivastigmine] Rash 04/02/2014  . Fentanyl Rash 04/02/2014  . Penicillins Rash 07/18/2009   Past Medical History  Diagnosis Date  . Skin cancer     basal cell on back x2  . Lewy body dementia   . Dyspnea   . Dizziness   . Orthostatic hypotension   . Anxiety   . Depression   . Hypothyroidism   . Osgood-Schlatter's disease   . Back pain     chronic  . Degenerative disc disease   . Arthritis   . Tachycardia     heart monitor last week for 48 hours no report yet  . Jaundice with stoppage of bile flow       after stent romoved  . Elevated LFTs   . Sleep apnea with use of continuous positive airway pressure (CPAP)   . Memory deficits 04/23/2013  . Chronic kidney disease     stage III  renal disease   . Restless leg syndrome 04/23/2013  . Dupuytren's contracture of right hand   . Erectile dysfunction    Past Surgical History  Procedure Laterality Date  . Gallbladder surgery  2007-2008  . Basal cell carcinoma excision  [id number]  . Lumbar fusion  1996  . Cervical fusion  1985  . Carpal tunnel release  2003  . Tonsillectomy    . Appendectomy    . Cholecystectomy    . Liver biopsy  12/27/2011    Procedure: LIVER BIOPSY;  Surgeon: Inda Castle, MD;  Location: [facility];  Service: Endoscopy;  Laterality: N/A;  pt moved from 3/22 to 3/18 ( aw)   Family History  Problem Relation Age of Onset  . Coronary artery disease Mother   . Heart attack Mother     stents in neck and heart ?  Marland Kitchen Crohn's disease Mother   . Cirrhosis Sister   . Cirrhosis Father   . Prostate cancer Maternal Uncle   . Liver cancer Cousin   . Colitis Daughter   . Diabetes Maternal Uncle   . Diabetes Brother   . Kidney disease Maternal Aunt   . Kidney disease Cousin    Social History   Social History  . Marital Status: Married    Spouse Name: Barnetta Chapel  . Number of Children: 2  . Years of Education: 9th   Occupational History  . disabled    Social History Main  Topics  . Smoking status: Former Smoker    Quit date: 12/27/1975  . Smokeless tobacco: Never Used  . Alcohol Use: No  . Drug Use: No  . Sexual Activity: Yes    Birth Control/ Protection: Condom   Other Topics Concern  . Not on file   Social History Narrative   Patient is married Barnetta Chapel) and lives at home with his wife and his mother.   Patient is disabled.   Patient has a 9th grade education.   Patient is right-handed.   Patient does not drink any caffeine.   Patient has two adult childre   Does not regularly exercise.      Review of Systems: Pertinent items are noted in HPI.  Physical Exam: Blood pressure 133/67, pulse 75, temperature 99.4 F (37.4 C), temperature source Rectal, resp. rate 14, height 5\' 8"  (1.727 m), SpO2 94 %. Physical Exam  Constitutional: He is well-developed, well-nourished, and in no distress.  HENT:  Head: Normocephalic and atraumatic.  Eyes: Conjunctivae and EOM are normal.  Neck: Neck supple. No thyromegaly present.  Cardiovascular: Normal rate, regular rhythm, normal heart sounds and intact distal pulses.   Pulmonary/Chest: Effort normal and breath sounds normal. He has no wheezes. He has no rales.  Abdominal: Soft. Bowel sounds are normal. He exhibits no distension. There is tenderness (left sided lower quadrant tenderness).  Musculoskeletal: He exhibits edema (1+ pitting edema bilaterally extending to knee).  Neurological:  Patient alert and oriented to person, place.  Not oriented to time.  CN exam unremarkable.  No facial droop appreciated.  Strength UE is 5/5 bilaterally, Strength LE is 5/5 bilaterally, sensation to light touch is equal in both upper and lower extremities.  Intention tremor is present with finger to nose testing.  Skin: Skin is warm and dry.    Lab results: Basic Metabolic Panel:  Recent Labs  09/10/15 0935  NA 134*  K 4.3  CL 97*  CO2 28  GLUCOSE 96  BUN 16  CREATININE 1.72*  CALCIUM 8.6*   Liver Function Tests:  Recent Labs  09/10/15 0935  AST 38  ALT 43  ALKPHOS 61  BILITOT 1.0  PROT 5.5*  ALBUMIN 3.5   No results for input(s): LIPASE, AMYLASE in the last 72 hours. No results for input(s): AMMONIA in the last 72 hours. CBC:  Recent Labs  09/10/15 0935  WBC 8.9  NEUTROABS 6.5  HGB 14.2  HCT 40.0  MCV 83.9  PLT 111*   Cardiac Enzymes:  Recent Labs  09/10/15 0935  TROPONINI <0.03   BNP: No results for input(s): PROBNP in the last 72 hours. D-Dimer: No results for input(s): DDIMER in the last 72 hours. CBG: No  results for input(s): GLUCAP in the last 72 hours. Hemoglobin A1C: No results for input(s): HGBA1C in the last 72 hours. Fasting Lipid Panel: No results for input(s): CHOL, HDL, LDLCALC, TRIG, CHOLHDL, LDLDIRECT in the last 72 hours. Thyroid Function Tests: No results for input(s): TSH, T4TOTAL, FREET4, T3FREE, THYROIDAB in the last 72 hours. Anemia Panel: No results for input(s): VITAMINB12, FOLATE, FERRITIN, TIBC, IRON, RETICCTPCT in the last 72 hours. Coagulation: No results for input(s): LABPROT, INR in the last 72 hours. Urine Drug Screen: Drugs of Abuse     Component Value Date/Time   LABOPIA POSITIVE* 10/07/2012 [place] DETECTED 10/07/2012 1244   LABBENZ POSITIVE* 10/07/2012 1244   AMPHETMU NONE DETECTED 10/07/2012 1244   THCU NONE DETECTED 10/07/2012 1244   LABBARB NONE  DETECTED 10/07/2012 1244    Alcohol Level: No results for input(s): ETH in the last 72 hours. Urinalysis:  Recent Labs  09/10/15 1057  COLORURINE YELLOW  LABSPEC 1.012  PHURINE 8.0  GLUCOSEU NEGATIVE  HGBUR NEGATIVE  BILIRUBINUR NEGATIVE  KETONESUR NEGATIVE  PROTEINUR NEGATIVE  NITRITE NEGATIVE  LEUKOCYTESUR NEGATIVE    Imaging results:  Ct Head Wo Contrast  09/10/2015  CLINICAL DATA:  Confusion. Left facial droop. Report of several recent falls. EXAM: CT HEAD WITHOUT CONTRAST TECHNIQUE: Contiguous axial images were obtained from the base of the skull through the vertex without intravenous contrast. COMPARISON:  Head CT October 08, 2015; brain MRI October 13, 2012 FINDINGS: There is mild diffuse atrophy. There is no intracranial mass, hemorrhage, extra-axial fluid collection, or midline shift. There is slight small vessel disease in the centra semiovale bilaterally. There is evidence of a prior small lacunar infarct in the mid right extreme capsule. Elsewhere gray-white compartments appear normal. There is no acute infarct evident. The bony calvarium appears intact. Mastoids are  hypoplastic but clear bilaterally. Orbits appear symmetric and normal bilaterally. IMPRESSION: Mild atrophy with slight periventricular small vessel disease. Prior small lacunar infarct in the midportion of the right extreme capsule. No intracranial mass, hemorrhage, or evidence suggesting acute infarct. Electronically Signed   By: Lowella Grip III M.D.   On: 09/10/2015 11:30   Dg Chest Portable 1 View  09/10/2015  CLINICAL DATA:  64 year old male with weakness, chest pain and shortness of breath EXAM: PORTABLE CHEST 1 VIEW COMPARISON:  Prior chest x-ray 10/07/2012 FINDINGS: Stable cardiac and mediastinal contours. The lungs are clear. No pulmonary edema, pleural effusion, focal airspace consolidation or pneumothorax. No acute osseous abnormality. IMPRESSION: No active disease. Electronically Signed   By: Jacqulynn Cadet M.D.   On: 09/10/2015 12:12    Other results: EKG: sinus tachycardia  Assessment & Plan by Problem: Active Problems:   Facial droop  64 y.o. male with a past medical history of lacunar and cerebellar CVA, depression, severe sleep apnea, vascular dementia, questionable Lewy Body dementia per chart review (which his wife states he does not have), chronic pain on daily opiates, CKD stage 2-3, peripheral artery disease, hypothyroidism.  Facial Droop, Convulsion, and Confusion Episode: now resolved on our examination.  Differential includes TIA/stroke, seizure, infection, or hypoxia.  Do not suspect an infectious source despite initial low grade temperature and patient wearing adult diapers with potential for urinary source.  Urinalysis was clean, no leukocytosis, CXR unremarkable, no other systemic signs or symptoms of infection.  Seizure activity is also lower on the differential as the patient reportedly was speaking during his episode.  His seizure history is not completely clear.  Prior notes mention seizures as events where he shakes in his sleep and a July 2014 note by Dr.  Jannifer Franklin (neurology) mentions concern that these events could represent a REM sleep disturbance.  No records of an EEG available to [place] at present.  Hypoxia from sleep apnea or chronic opiates/polypharmacy could have contributed to acute confusion, however, this presentation appears more consistent with TIA or stroke.  Patient does have history of prior lacunar and cerebellar CVA.  Likely more TIA with facial droop that resolved and no focal deficits appreciated on exam.  CT showed no evidence of acute intracranial process. - MRI brain - ASA 325 x 1 dose, then daily ASA 81mg  - Obtain lipid panel, start Atorvastatin 80mg  daily - Check A1C, TSH, UDS - PT, OT, SLP eval.  Failed initial swallow  screen, currently NPO - Neuro checks - Continue Valproate 500mg  q12h - Falls precautions  Sleep Apnea: Continue home CPAP. Hypoxia may have contributed to patient's episode of confusion. - Monitor pulse oximetry  Chronic Pain: Patient has a history of chronic pain treated with chronic opioids and per his wife, is very sensitive to pain.  He is on Oxy ER 20mg  tid, acetaminophen 325 two tablets tid, and oxy IR 5mg  prn - Dilaudid 1mg  IV q4h prn for pain.   Anxiety & Depression: Patient on Alprazolam 1mg  tid and Lexapro 20mg  daily - Lorazepam 1mg  IV q8h prn   Hypothyroidism - Check TSH - Continue Synthroid home dose.  Will do IV until patient is able to take po meds  FEN: Fluids: none Electrolytes: replete prn Nutrition: NPO pending SLP eval  DVT PPx: heparin  Code: DNR  Dispo: Disposition is deferred at this time, awaiting improvement of current medical problems. Anticipated discharge in approximately 1-2 day(s).   The patient does have a current PCP (No primary care provider on file.) and does not need an [facility] hospital follow-up appointment after discharge.  The patient does have transportation limitations that hinder transportation to clinic appointments.  Signed: Jule Ser, DO 09/10/2015,  5:41 PM

## 2015-09-11 LAB — LIPID PANEL
Cholesterol: 156 mg/dL (ref 0–200)
HDL: 27 mg/dL — ABNORMAL LOW (ref >40)
LDL Cholesterol: 81 mg/dL (ref 0–99)
Total CHOL/HDL Ratio: 5.8 RATIO
Triglycerides: 238 mg/dL — ABNORMAL HIGH (ref <150)
VLDL: 48 mg/dL — ABNORMAL HIGH (ref 0–40)

## 2015-09-11 LAB — CBC
HCT: 40.6 % (ref 39.0–52.0)
Hemoglobin: 14.2 g/dL (ref 13.0–17.0)
MCH: 29.8 pg (ref 26.0–34.0)
MCHC: 35 g/dL (ref 30.0–36.0)
MCV: 85.1 fL (ref 78.0–100.0)
Platelets: 121 10*3/uL — ABNORMAL LOW (ref 150–400)
RBC: 4.77 MIL/uL (ref 4.22–5.81)
RDW: 12.5 % (ref 11.5–15.5)
WBC: 6.3 10*3/uL (ref 4.0–10.5)

## 2015-09-11 LAB — PHOSPHORUS: Phosphorus: 3.8 mg/dL (ref 2.5–4.6)

## 2015-09-11 LAB — BASIC METABOLIC PANEL
Anion gap: 9 (ref 5–15)
BUN: 18 mg/dL (ref 6–20)
CO2: 32 mmol/L (ref 22–32)
Calcium: 9 mg/dL (ref 8.9–10.3)
Chloride: 95 mmol/L — ABNORMAL LOW (ref 101–111)
Creatinine, Ser: 1.6 mg/dL — ABNORMAL HIGH (ref 0.61–1.24)
GFR calc Af Amer: 51 mL/min — ABNORMAL LOW (ref >60)
GFR calc non Af Amer: 44 mL/min — ABNORMAL LOW (ref >60)
Glucose, Bld: 88 mg/dL (ref 65–99)
Potassium: 3.6 mmol/L (ref 3.5–5.1)
Sodium: 136 mmol/L (ref 135–145)

## 2015-09-11 LAB — RAPID URINE DRUG SCREEN, HOSP PERFORMED
Amphetamines: NOT DETECTED
Barbiturates: NOT DETECTED
Benzodiazepines: POSITIVE — AB
Cocaine: NOT DETECTED
Opiates: NOT DETECTED
Tetrahydrocannabinol: NOT DETECTED

## 2015-09-11 LAB — MAGNESIUM: Magnesium: 2.1 mg/dL (ref 1.7–2.4)

## 2015-09-11 MED ORDER — LEVOTHYROXINE SODIUM 112 MCG PO TABS: 112.0000 ug | ORAL_TABLET | Freq: Every day | ORAL | Status: DC

## 2015-09-11 MED ORDER — DIVALPROEX SODIUM 500 MG PO DR TAB
500.0000 mg | DELAYED_RELEASE_TABLET | Freq: Two times a day (BID) | ORAL | Status: DC
  Filled 2015-09-11: qty 1

## 2015-09-11 MED ORDER — ASPIRIN 81 MG PO CHEW: 81.0000 mg | CHEWABLE_TABLET | Freq: Every day | ORAL | Status: DC

## 2015-09-11 MED ORDER — LEVOTHYROXINE SODIUM 100 MCG PO TABS: 100.0000 ug | ORAL_TABLET | Freq: Every day | ORAL | Status: DC

## 2015-09-11 MED ORDER — ATORVASTATIN CALCIUM 80 MG PO TABS: 80.0000 mg | ORAL_TABLET | Freq: Every day | ORAL | Status: DC

## 2015-09-11 MED ORDER — OXYCODONE HCL 5 MG PO TABS
20.0000 mg | ORAL_TABLET | Freq: Once | ORAL | Status: AC
  Administered 2015-09-11: 20 mg via ORAL
  Filled 2015-09-11: qty 4

## 2015-09-11 NOTE — Progress Notes (Signed)
Subjective: [name] is alert and oriented x3 today. He does respond inappropriately to questions occasionally.  He has no complaints. Denies any weakness, slurred speech or further episodes of shaking/convulsions. Wife does note that he has some shortness of breath with exertion when walking to the bathroom at home. He is mostly bed bound at home with his only activity being walking with a cane or walker to the bathroom.   Objective: Vital signs in last 24 hours: Filed Vitals:   09/10/15 2347 09/11/15 0139 09/11/15 0318 09/11/15 0506  BP: 130/63 150/61 140/52 138/79  Pulse: 60 58 61 63  Temp: 98.4 F (36.9 C) 97.6 F (36.4 C) 97.8 F (36.6 C) 97.8 F (36.6 C)  TempSrc: Oral Oral Oral Oral  Resp: 18 17 20 18   Height:      SpO2: 93% 96% 95% 100%   Weight change:   Intake/Output Summary (Last 24 hours) at 09/11/15 1144 Last data filed at 09/11/15 [id number]  Gross per 24 hour  Intake      0 ml  Output    400 ml  Net   -400 ml    Physical Exam General: alert, obese male resting comfortably in bed and cooperative to examination.  HEENT: normocephalic and atraumatic.  Lungs: normal respiratory effort, no accessory muscle use, normal breath sounds, no crackles, and no wheezes. Heart: normal rate, regular rhythm, no murmur, no gallop, and no rub.  Abdomen: soft, non-tender, normal bowel sounds, no distention Pulses: 2+ DP/PT pulses bilaterally Extremities: No cyanosis, clubbing, 1+ edema to knees bilaterally Neurologic: alert & oriented X3, cranial nerves II-XII intact, strength normal in all extremities, sensation intact to light touch, and gait normal. No facial droop noted.  Psych: Normal mood and affect  Medications: I have reviewed the patient's current medications. Scheduled Meds: . aspirin  81 mg Oral Daily  . aspirin  325 mg Oral Once  . atorvastatin  80 mg Oral q1800  . divalproex  500 mg Oral Q12H  . heparin  5,000 Units Subcutaneous 3 times per day  . [START ON  09/12/2015] levothyroxine  112 mcg Oral QAC breakfast   Continuous Infusions:  PRN Meds:.HYDROmorphone (DILAUDID) injection, LORazepam, ondansetron (ZOFRAN) IV, senna-docusate Assessment/Plan: Active Problems:   Facial droop  Facial Droop, Convulsion, and Confusion Episode: No longer confused. AAOx3. Per wife, he is back to his baseline. MRI brain with no acute abnormalities showing age advanced atrophy and white matter disease likely sequela of chronic microvascular ischemia. SLP evaluated today with likely behavioral challenges as the cause of his intermittent dysphagia with no functional problems.  - ASA 81mg  daily - Continue Atorvastatin 80mg  daily - A1C pending - PT, OT eval pending - Neuro checks - Continue Valproate 500mg  q12h - Falls precautions  Sleep Apnea: Continue home CPAP. Hypoxia may have contributed to patient's episode of confusion. - Monitor pulse oximetry, maintained good pulse ox during hospitalization with no episodes of desaturation while on room air  Chronic Pain: Patient has a history of chronic pain treated with chronic opioids and per his wife, is very sensitive to pain. He is on Oxy ER 20mg  tid, acetaminophen 325 two tablets tid, and oxy IR 5mg  prn - Dilaudid 1mg  IV q4h prn for pain.  Anxiety & Depression: Patient on Alprazolam 1mg  tid and Lexapro 20mg  daily - Lorazepam 1mg  IV q8h prn   Hypothyroidism: He is on Synthrid 112 mcg daily at home. TSH 0.189 here.  - Decrease Synthroid dose to 100 mcg - Will need  recheck TSH in 6 weeks  FEN: Fluids: none Electrolytes: replete prn Nutrition: Regular diet  DVT PPx: heparin  Code: DNR  Dispo: Discharge today pending PT evaluation  The patient does have a current PCP (Pcp Not In System) and does need an [facility] hospital follow-up appointment after discharge.  The patient does have transportation limitations that hinder transportation to clinic appointments.  .Services Needed at time of discharge: Y = Yes,  Blank = No PT:   OT:   RN:   Equipment:   Other:     LOS: 1 day   Maryellen Pile, MD IMTS PGY-1 [phone] 09/11/2015, 11:44 AM

## 2015-09-11 NOTE — Progress Notes (Signed)
Patient discharge teaching given, including activity, diet, follow-up appoints, and medications. Patient verbalized understanding of all discharge instructions. IV access was d/c'd. Vitals are stable. Skin is intact except as charted in most recent assessments. Pt to be escorted out by NT, to be driven home by family. 

## 2015-09-11 NOTE — Discharge Instructions (Signed)
You had what is called a TIA (transient ischemic attack) or "mini-stroke." We are putting you on a new medication called Atorvastatin. I have provided you some information below.   Please follow up with your PCP and your Neurologist.   Atorvastatin tablets What is this medicine? ATORVASTATIN (a TORE va sta tin) is known as a HMG-CoA reductase inhibitor or 'statin'. It lowers the level of cholesterol and triglycerides in the blood. This drug may also reduce the risk of heart attack, stroke, or other health problems in patients with risk factors for heart disease. Diet and lifestyle changes are often used with this drug. This medicine may be used for other purposes; ask your health care provider or pharmacist if you have questions. What should I tell my health care provider before I take this medicine? They need to know if you have any of these conditions: -frequently drink alcoholic beverages -history of stroke, TIA -kidney disease -liver disease -muscle aches or weakness -other medical condition -an unusual or allergic reaction to atorvastatin, other medicines, foods, dyes, or preservatives -pregnant or trying to get pregnant -breast-feeding How should I use this medicine? Take this medicine by mouth with a glass of water. Follow the directions on the prescription label. You can take this medicine with or without food. Take your doses at regular intervals. Do not take your medicine more often than directed. Talk to your pediatrician regarding the use of this medicine in children. While this drug may be prescribed for children as young as 13 years old for selected conditions, precautions do apply. Overdosage: If you think you have taken too much of this medicine contact a poison control center or emergency room at once. NOTE: This medicine is only for you. Do not share this medicine with others. What if I miss a dose? If you miss a dose, take it as soon as you can. If it is almost time for  your next dose, take only that dose. Do not take double or extra doses. What may interact with this medicine? Do not take this medicine with any of the following medications: -red yeast rice -telaprevir -telithromycin -voriconazole This medicine may also interact with the following medications: -alcohol -antiviral medicines for HIV or AIDS -boceprevir -certain antibiotics like clarithromycin, erythromycin, troleandomycin -certain medicines for cholesterol like fenofibrate or gemfibrozil -cimetidine -clarithromycin -colchicine -cyclosporine -digoxin -male hormones, like estrogens or progestins and birth control pills -grapefruit juice -medicines for fungal infections like fluconazole, itraconazole, ketoconazole -niacin -rifampin -spironolactone This list may not describe all possible interactions. Give your health care provider a list of all the medicines, herbs, non-prescription drugs, or dietary supplements you use. Also tell them if you smoke, drink alcohol, or use illegal drugs. Some items may interact with your medicine. What should I watch for while using this medicine? Visit your doctor or health care professional for regular check-ups. You may need regular tests to make sure your liver is working properly. Tell your doctor or health care professional right away if you get any unexplained muscle pain, tenderness, or weakness, especially if you also have a fever and tiredness. Your doctor or health care professional may tell you to stop taking this medicine if you develop muscle problems. If your muscle problems do not go away after stopping this medicine, contact your health care professional. This drug is only part of a total heart-health program. Your doctor or a dietician can suggest a low-cholesterol and low-fat diet to help. Avoid alcohol and smoking, and keep a proper exercise  schedule. Do not use this drug if you are pregnant or breast-feeding. Serious side effects to an  unborn child or to an infant are possible. Talk to your doctor or pharmacist for more information. This medicine may affect blood sugar levels. If you have diabetes, check with your doctor or health care professional before you change your diet or the dose of your diabetic medicine. If you are going to have surgery tell your health care professional that you are taking this drug. What side effects may I notice from receiving this medicine? Side effects that you should report to your doctor or health care professional as soon as possible: -allergic reactions like skin rash, itching or hives, swelling of the face, lips, or tongue -dark urine -fever -joint pain -muscle cramps, pain -redness, blistering, peeling or loosening of the skin, including inside the mouth -trouble passing urine or change in the amount of urine -unusually weak or tired -yellowing of eyes or skin Side effects that usually do not require medical attention (report to your doctor or health care professional if they continue or are bothersome): -constipation -heartburn -stomach gas, pain, upset This list may not describe all possible side effects. Call your doctor for medical advice about side effects. You may report side effects to FDA at 1-800-FDA-1088. Where should I keep my medicine? Keep out of the reach of children. Store at room temperature between 20 to 25 degrees C (68 to 77 degrees F). Throw away any unused medicine after the expiration date. NOTE: This sheet is a summary. It may not cover all possible information. If you have questions about this medicine, talk to your doctor, pharmacist, or health care provider.    2016, Elsevier/Gold Standard. (2011-08-17 [id number])

## 2015-09-11 NOTE — Evaluation (Signed)
Physical Therapy Evaluation Patient Details Name: Hayden Rose MRN: [id number] DOB: 07-23-1951 Today's Date: 09/11/2015   History of Present Illness  Pt adm with episode of convulsing, slurred speech, confusion, and left-sided facial droop. MRI negative for acute event. PMH - Cerebellar CVA, dementia, chronic pain.  Clinical Impression  Pt very close to his baseline for mobility and from PT standpoint ready to return home with wife.    Follow Up Recommendations No PT follow up    Equipment Recommendations  None recommended by PT    Recommendations for Other Services       Precautions / Restrictions Precautions Precautions: Fall Restrictions Weight Bearing Restrictions: No      Mobility  Bed Mobility Overal bed mobility: Needs Assistance Bed Mobility: Sidelying to Sit   Sidelying to sit: Supervision;HOB elevated       General bed mobility comments: Incr time and effort  Transfers Overall transfer level: Needs assistance Equipment used: Rolling walker (2 wheeled) Transfers: Sit to/from Stand Sit to Stand: Supervision            Ambulation/Gait Ambulation/Gait assistance: Supervision Ambulation Distance (Feet): 30 Feet Assistive device: Rolling walker (2 wheeled) Gait Pattern/deviations: Step-to pattern Gait velocity: slow Gait velocity interpretation: Below normal speed for age/gender General Gait Details: Used rolling walker like a standard walker which is what he uses at home.  Stairs            Wheelchair Mobility    Modified Rankin (Stroke Patients Only) Modified Rankin (Stroke Patients Only) Pre-Morbid Rankin Score: Moderate disability Modified Rankin: Moderate disability     Balance Overall balance assessment: Needs assistance Sitting-balance support: No upper extremity supported;Feet supported Sitting balance-Leahy Scale: Good     Standing balance support: No upper extremity supported Standing balance-Leahy Scale: Fair                                Pertinent Vitals/Pain      Home Living Family/patient expects to be discharged to:: Private residence Living Arrangements: Spouse/significant other;Parent Available Help at Discharge: Family   Home Access: Stairs to enter   Technical brewer of Steps: 3 Home Layout: One level Home Equipment: Walker - standard;Cane - single point      Prior Function Level of Independence: Needs assistance   Gait / Transfers Assistance Needed: Amb household distances with walker or cane.           Hand Dominance        Extremity/Trunk Assessment   Upper Extremity Assessment: Defer to OT evaluation           Lower Extremity Assessment: Overall WFL for tasks assessed         Communication      Cognition Arousal/Alertness: Awake/alert Behavior During Therapy: Flat affect Overall Cognitive Status: History of cognitive impairments - at baseline                      General Comments      Exercises        Assessment/Plan    PT Assessment Patent does not need any further PT services  PT Diagnosis Difficulty walking   PT Problem List    PT Treatment Interventions     PT Goals (Current goals can be found in the Care Plan section) Acute Rehab PT Goals PT Goal Formulation: All assessment and education complete, DC therapy    Frequency     Barriers  to discharge        Co-evaluation               End of Session Equipment Utilized During Treatment: Gait belt Activity Tolerance: Patient tolerated treatment well Patient left: in bed;with call bell/phone within reach;with bed alarm set;with family/visitor present Nurse Communication: Mobility status         Time: 1152-1203 PT Time Calculation (min) (ACUTE ONLY): 11 min   Charges:   PT Evaluation $Initial PT Evaluation Tier I: 1 Procedure     PT G Codes:        Dhillon [place] 09/23/2015, 1:59 PM [facility] PT [phone]

## 2015-09-11 NOTE — Evaluation (Signed)
Clinical/Bedside Swallow Evaluation Patient Details  Name: Hayden Rose MRN: [id number] Date of Birth: 1951/08/30  Today's Date: 09/11/2015 Time: SLP Start Time (ACUTE ONLY): 1028 SLP Stop Time (ACUTE ONLY): 1050 SLP Time Calculation (min) (ACUTE ONLY): 22 min  Past Medical History:  Past Medical History  Diagnosis Date  . Skin cancer     basal cell on back x2  . Lewy body dementia   . Dyspnea   . Dizziness   . Orthostatic hypotension   . Anxiety   . Depression   . Hypothyroidism   . Osgood-Schlatter's disease   . Back pain     chronic  . Degenerative disc disease   . Arthritis   . Tachycardia     heart monitor last week for 48 hours no report yet  . Jaundice with stoppage of bile flow     after stent romoved  . Elevated LFTs   . Sleep apnea with use of continuous positive airway pressure (CPAP)   . Memory deficits 04/23/2013  . Chronic kidney disease     stage III  renal disease   . Restless leg syndrome 04/23/2013  . Dupuytren's contracture of right hand   . Erectile dysfunction    Past Surgical History:  Past Surgical History  Procedure Laterality Date  . Gallbladder surgery  2007-2008  . Basal cell carcinoma excision  [id number]  . Lumbar fusion  1996  . Cervical fusion  1985  . Carpal tunnel release  2003  . Tonsillectomy    . Appendectomy    . Cholecystectomy    . Liver biopsy  12/27/2011    Procedure: LIVER BIOPSY;  Surgeon: Hayden Castle, MD;  Location: [facility];  Service: Endoscopy;  Laterality: N/A;  pt moved from 3/22 to 3/18 ( aw)   HPI:  64 y.o. male with a past medical history of lacunar and cerebellar CVA, depression, severe sleep apnea, vascular dementia, questionable Lewy Body dementia per chart review (which his wife states he does not have), former smoker and alcohol, chronic pain on daily opiates, CKD stage 2-3, peripheral artery disease, hypothyroidism admitted due to pt convulsing, slurred speech, confusion, and left-sided facial droop this  morning. MRI no acute intracranial abnormality, age advanced atrophy and white matter disease likely reflects the sequela of chronic microvascular ischemia. CXR no active disease.   Assessment / Plan / Recommendation Clinical Impression  Pt's wife reports pt "strangles/coughs" periodically and mild complaints of globus with solids and liquids and increased rate of eating/drinking leading to shortness of breath. Pt has significantly limited mobility and is in a reclined position for majority of the day due to back pain. Wife stated he sits up meals (approximately 60-70 degress). SLP suspects behavioral challenges have led to intermittent dysphagia. No current or past pna. SLP educted pt and wife in depth re: swallow precautions: smaller sips and slow rate to prevent frequent apenic periods and SOB, importance of adequate upright position and 45 min after meals (GERD), problematic textures to use extra caution with, pills one at a time with water or applesauce (not handful as he currently does). Educated to limit distractions in relation to cognitive challenges. Wife with good awareness of likely progression but would benefit from further discussion re: progression of dysphagia and possible NPO vs comfort feeds status. No further ST needs at present.      Aspiration Risk  Moderate aspiration risk    Diet Recommendation     Medication Administration: Whole meds with liquid  Other  Recommendations Oral Care Recommendations: Oral care BID   Follow up Recommendations  None    Frequency and Duration            Prognosis        Swallow Study   General HPI: 64 y.o. male with a past medical history of lacunar and cerebellar CVA, depression, severe sleep apnea, vascular dementia, questionable Lewy Body dementia per chart review (which his wife states he does not have), former smoker and alcohol, chronic pain on daily opiates, CKD stage 2-3, peripheral artery disease, hypothyroidism admitted due to  pt convulsing, slurred speech, confusion, and left-sided facial droop this morning. MRI no acute intracranial abnormality, age advanced atrophy and white matter disease likely reflects the sequela of chronic microvascular ischemia. CXR no active disease. Type of Study: Bedside Swallow Evaluation Previous Swallow Assessment:  (none found) Diet Prior to this Study: NPO Temperature Spikes Noted: No Respiratory Status: Room air History of Recent Intubation: No Behavior/Cognition: Alert;Cooperative Oral Cavity Assessment: Within Functional Limits Oral Care Completed by SLP: No Oral Cavity - Dentition: Adequate natural dentition Vision: Functional for self-feeding Self-Feeding Abilities: Able to feed self;Needs set up Patient Positioning: Upright in bed Baseline Vocal Quality: Normal Volitional Cough: Strong Volitional Swallow: Able to elicit    Oral/Motor/Sensory Function Overall Oral Motor/Sensory Function: Within functional limits   Ice Chips Ice chips: Not tested   Thin Liquid Thin Liquid: Within functional limits Presentation: Cup;Straw    Nectar Thick Nectar Thick Liquid: Not tested   Honey Thick Honey Thick Liquid: Not tested   Puree Puree: Within functional limits   Solid Solid: Within functional limits       Hayden Rose 09/11/2015,11:14 AM Hayden Rose Hayden Rose.[employer] [phone]

## 2015-09-11 NOTE — Evaluation (Signed)
Occupational Therapy Evaluation Patient Details Name: Hayden Rose MRN: [id number] DOB: Jun 08, 1951 Today's Date: 09/11/2015    History of Present Illness Pt adm with episode of convulsing, slurred speech, confusion, and left-sided facial droop. MRI negative for acute event. PMH - Cerebellar CVA, dementia, chronic pain.   Clinical Impression   Pt is eager to return home. Assisted with LB bathing and dressing and all IADL at baseline. Pt and wife are not interested in AE for increasing independence.  Pt in the process of having bathroom remodeled by the [name] for improved safety and accessibility. Pt has returned to his baseline in ADL. No OT needs.    Follow Up Recommendations  No OT follow up    Equipment Recommendations  None recommended by OT    Recommendations for Other Services       Precautions / Restrictions Precautions Precautions: Fall Restrictions Weight Bearing Restrictions: No      Mobility Bed Mobility Overal bed mobility: Needs Assistance Bed Mobility: Sidelying to Sit   Sidelying to sit: Supervision;HOB elevated       General bed mobility comments: Incr time and effort  Transfers Overall transfer level: Needs assistance Equipment used: Rolling walker (2 wheeled) Transfers: Sit to/from Stand Sit to Stand: Supervision              Balance Overall balance assessment: Needs assistance Sitting-balance support: No upper extremity supported;Feet supported Sitting balance-Leahy Scale: Good     Standing balance support: No upper extremity supported Standing balance-Leahy Scale: Fair                              ADL Overall ADL's : At baseline                                       General ADL Comments: Offered pt and wife education in use of AE for LB bathing and dressing, but they declined.     Vision     Perception     Praxis      Pertinent Vitals/Pain Pain Assessment: No/denies pain     Hand Dominance  Right   Extremity/Trunk Assessment Upper Extremity Assessment Upper Extremity Assessment: Overall WFL for tasks assessed   Lower Extremity Assessment Lower Extremity Assessment: Defer to PT evaluation       Communication Communication Communication: No difficulties   Cognition Arousal/Alertness: Awake/alert Behavior During Therapy: Flat affect Overall Cognitive Status: History of cognitive impairments - at baseline                     General Comments       Exercises       Shoulder Instructions      Home Living Family/patient expects to be discharged to:: Private residence Living Arrangements: Spouse/significant other;Parent Available Help at Discharge: Family;Available 24 hours/day Type of Home: House Home Access: Stairs to enter [employer] of Steps: 3   Home Layout: One level     Bathroom Shower/Tub: Teacher, early years/pre: Standard     Home Equipment: Walker - standard;Cane - single point;Shower seat   Additional Comments: Wife reports that the [place] is supposed to remodel the bathroom with a walk in shower and grab bars.      Prior Functioning/Environment Level of Independence: Needs assistance  Gait / Transfers Assistance Needed: Amb household distances with walker  or cane. ADL's / Homemaking Assistance Needed: Wife routinely assists pt with LB bathing and dressing, wife performs meal prep and housekeeping.        OT Diagnosis: Cognitive deficits   OT Problem List:     OT Treatment/Interventions:      OT Goals(Current goals can be found in the care plan section) Acute Rehab OT Goals Patient Stated Goal: home today  OT Frequency:     Barriers to D/C:            Co-evaluation              End of Session    Activity Tolerance: Patient tolerated treatment well Patient left: in bed;with call bell/phone within reach;with family/visitor present   Time: 1435-1447 OT Time Calculation (min): 12 min Charges:   OT General Charges $OT Visit: 1 Procedure OT Evaluation $Initial OT Evaluation Tier I: 1 Procedure G-Codes:    Malka So 09/11/2015, 3:05 PM [phone]

## 2015-09-11 NOTE — Discharge Summary (Signed)
Name: Hayden Rose MRN: [id number] DOB: 07/09/1951 64 y.o. PCP: Pcp Not In System  Date of Admission: 09/10/2015  9:27 AM Date of Discharge: 09/11/2015 Attending Physician: Dr. Dareen Piano  Discharge Diagnosis: Active Problems:   Facial droop  Discharge Medications:   Medication List    TAKE these medications        ALPRAZolam 1 MG tablet  Commonly known as:  XANAX  Take 1 mg by mouth 2 (two) times daily.     aspirin 81 MG chewable tablet  Chew 1 tablet (81 mg total) by mouth daily.     atorvastatin 80 MG tablet  Commonly known as:  LIPITOR  Take 1 tablet (80 mg total) by mouth daily at 6 PM.     carbamazepine 200 MG tablet  Commonly known as:  TEGRETOL  One tablet in the morning and two tablets in the evening     clonazePAM 1 MG tablet  Commonly known as:  KLONOPIN  Take 1 mg by mouth daily. 1 tab @ 1 pm     cyclobenzaprine 10 MG tablet  Commonly known as:  FLEXERIL  Take 10 mg by mouth 3 (three) times daily.     divalproex 500 MG DR tablet  Commonly known as:  DEPAKOTE  Take 500 mg by mouth 2 (two) times daily.     donepezil 5 MG tablet  Commonly known as:  ARICEPT  Take 5 mg by mouth at bedtime.     fish oil-omega-3 fatty acids 1000 MG capsule  Take 1 g by mouth daily.     GEODON 60 MG capsule  Generic drug:  ziprasidone  Take 180 mg by mouth every evening.     hydrochlorothiazide 25 MG tablet  Commonly known as:  HYDRODIURIL  Take 25 mg by mouth daily.     hydrOXYzine 10 MG tablet  Commonly known as:  ATARAX/VISTARIL  Take 10 mg by mouth 3 (three) times daily as needed for itching.     levothyroxine 100 MCG tablet  Commonly known as:  SYNTHROID, LEVOTHROID  Take 1 tablet (100 mcg total) by mouth daily before breakfast.     loperamide 2 MG tablet  Commonly known as:  IMODIUM A-D  Take 2 mg by mouth 4 (four) times daily as needed for diarrhea or loose stools.     morphine 60 MG 12 hr tablet  Commonly known as:  MS CONTIN  Take 60 mg by mouth  3 (three) times daily.     omeprazole 20 MG capsule  Commonly known as:  PRILOSEC  Take 20 mg by mouth 2 (two) times daily before a meal.     ondansetron 8 MG tablet  Commonly known as:  ZOFRAN  Take 8 mg by mouth every 8 (eight) hours as needed for nausea or vomiting.     Oxycodone HCl 20 MG Tabs  Take 1 tablet by mouth 3 (three) times daily as needed. For pain     oxyCODONE 15 MG immediate release tablet  Commonly known as:  ROXICODONE  Take 15 mg by mouth every 6 (six) hours.     pantoprazole 40 MG tablet  Commonly known as:  PROTONIX  Take 40 mg by mouth at bedtime.     pregabalin 50 MG capsule  Commonly known as:  LYRICA  Take 50 mg by mouth 3 (three) times daily.        Disposition and follow-up:   Hayden Rose was discharged from [facility] in  Stable condition.  At the hospital follow up visit please address:  1.  TIA - No evidence of stroke on MRI. Started on Atorvastatin 80 mg daily.       Hypothyroidism - TSH 0.189 on 11/30. Decreased Synthroid to 100 mcg daily. Needs TSH recheck in 6 weeks.   2.  Labs / imaging needed at time of follow-up: TSH in 6 weeks  3.  Pending labs/ test needing follow-up: None  Follow-up Appointments: Follow-up Information    Schedule an appointment as soon as possible for a visit with PCP.   Why:  in the next 1-2 weeks for hospital follow up      Discharge Instructions: Discharge Instructions    Diet - low sodium heart healthy    Complete by:  As directed      Increase activity slowly    Complete by:  As directed      Increase activity slowly    Complete by:  As directed            Consultations:  None  Procedures Performed:  Ct Head Wo Rose  09/10/2015  CLINICAL DATA:  Confusion. Left facial droop. Report of several recent falls. EXAM: CT HEAD WITHOUT Rose TECHNIQUE: Contiguous axial images were obtained from the base of the skull through the vertex without intravenous Rose.  COMPARISON:  Head CT October 08, 2015; brain MRI October 13, 2012 FINDINGS: There is mild diffuse atrophy. There is no intracranial mass, hemorrhage, extra-axial fluid collection, or midline shift. There is slight small vessel disease in the centra semiovale bilaterally. There is evidence of a prior small lacunar infarct in the mid right extreme capsule. Elsewhere gray-white compartments appear normal. There is no acute infarct evident. The bony calvarium appears intact. Mastoids are hypoplastic but clear bilaterally. Orbits appear symmetric and normal bilaterally. IMPRESSION: Mild atrophy with slight periventricular small vessel disease. Prior small lacunar infarct in the midportion of the right extreme capsule. No intracranial mass, hemorrhage, or evidence suggesting acute infarct. Electronically Signed   By: Lowella Grip III M.D.   On: 09/10/2015 11:30   Hayden Rose  09/10/2015  CLINICAL DATA:  Increased confusion, facial droop, and generalized weakness. Slurred speech lasting 2 minutes. EXAM: MRI HEAD WITHOUT Rose TECHNIQUE: Multiplanar, multiecho pulse sequences of the brain and surrounding structures were obtained without intravenous Rose. COMPARISON:  CT head without Rose from the same day. MRI brain 10/13/2012. FINDINGS: The diffusion-weighted images demonstrate no evidence for acute or subacute infarction. No acute hemorrhage or mass lesion is present. Age advanced atrophy and white matter disease is present. The ventricles are proportionate to the degree of atrophy. No significant extra-axial fluid collection is present. The brainstem is within normal limits. A remote lacunar infarct is present in the right cerebellum. The internal auditory canals are within normal limits. Flow is present in the major intracranial arteries. The globes and orbits are intact. The paranasal sinuses and mastoid air cells are clear. The skullbase is within normal limits. Midline structures are  unremarkable. IMPRESSION: 1. No acute intracranial abnormality. 2. Age advanced atrophy and white matter disease likely reflects the sequela of chronic microvascular ischemia. Electronically Signed   By: San Morelle M.D.   On: 09/10/2015 17:57   Dg Chest Portable 1 View  09/10/2015  CLINICAL DATA:  64 year old male with weakness, chest pain and shortness of breath EXAM: PORTABLE CHEST 1 VIEW COMPARISON:  Prior chest x-ray 10/07/2012 FINDINGS: Stable cardiac and mediastinal contours. The lungs are clear. No  pulmonary edema, pleural effusion, focal airspace consolidation or pneumothorax. No acute osseous abnormality. IMPRESSION: No active disease. Electronically Signed   By: Jacqulynn Cadet M.D.   On: 09/10/2015 12:12   Admission HPI: Hayden. JAQUAE KLATT is a 64 y.o. male with a past medical history of lacunar and cerebellar CVA, depression, severe sleep apnea, vascular dementia, questionable Lewy Body dementia per chart review (which his wife states he does not have), chronic pain on daily opiates, CKD stage 2-3, peripheral artery disease, hypothyroidism. Patient presents today with his wife and daughter present in the room due to episode of convulsing, slurred speech, confusion, and left-sided facial droop this morning. Wife and daughter provided most of the history. Patients wife reports that she found him this AM convulsing in his bed for about 2-3 minutes. States he was talking coherently the entire time, but his speech was slurred. Wife states that the patients adult diapers were minimally wet after the episode but unable to tell if this occurred during the witnessed episode. There was no tongue biting witnessed, just some jaw pain afterwards. Wife also states that patient was confused unable to state where he was or whether it was day or night. After this episode, wife reports patient had a left-sided facial droop that was also present in the ED. This resolved over the course of time in  the ED and was absent upon our evaluation. Patient reportedly only walks to and from the bathroom with the aid of a walker and was noticed to have more of a shuffling gait recently according to his wife. Patient does use a CPAP machine at home which monitors number of apneic events overnight and wife states that last night he had 57 episodes, which is much greater than his baseline. Wife also checked his BP after episode this morning and it was 157/58, pulse 48 and pulse ox 52%. States that sometimes after nights of increased episodes, patient has increased confusion and weakness, but today was much worse which prompted the ED visit.   Patient complains of frequently alternating between hot and cold with difficulty regulating his temperature after previous CVA's. Currently, denies any chest pain or shortness of breath. No urinary complaints, no constipation, nausea, vomiting, or diarrhea. Reports bilateral lower extremity swelling, right upper extremity swelling. Wife reports that he has had a 25 pound weight gain in under 4 months. No history of CHF but recently had echo done at the [place] to evaluate for CHF. He is only on HCTZ for HTN. States he takes a daily aspirin. Receives all his care at the [place]. 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 Has used medicinal marijuana in the past.   In the ED, patient had facial droop upon arrival that improved. CBC was unremarkable with exception of platelet count of 111. CMP revealed an elevated creatine of 1.72 (unclear what baseline is but was 1.5 two years ago). Sodium mildly low at 134, Chloride 97. LFTs were normal. Carbamazepine level was < 2.0. Valproic acid level normal. Temperature was borderline at 100.4. Urinalysis was clean. CT head without Rose showed "mild atrophy with slight periventricular small vessel disease. Prior small lacunar infarct in the midportion of the right extreme capsule. No intracranial  mass, hemorrhage, or evidence suggesting acute infarct." Portable chest xray was unremarkable.   Hospital Course by problem list: Active Problems:   Facial droop   Espiridion Claeys is a 64yo man with a PMH significant for lacunar and cerebellar CVA, vascular  dementia, depression, anxiety, chronic back pain, severe sleep apnea, stage 2-3 CDK, PAD and hypothyroid who presented with an episode of shaking, slurred speech, confusion and left-sided facial droop, most likely secondary to TIA.   Hospital course by problem:   Facial Droop, Convulsion, and Confusion Episode, likely TIA: Patient presented with an episode of shaking, slurred speech, confusion and left-sided facial droop which had resolved at the time of our examination. Seizure unlikely as patient was speaking during the episode. Patient had a low grade fever of 100.37F on admission but no further fevers and no leukocytosis, making infection less likely. Urinalysis and chest x-ray were unremarkable. CT showed no evidence of acute intracranial process and MRI showed no evidence of acute stroke. Patient was seen by PT during this admission and they did not recommend further interventions. Patient advised to continue Depakote 500mg  BID, continue daily aspirin 81mg  and start atorvastatin 80mg  daily. Patient will follow up with his primary care provider to adjust the dose of statin outpatient if needed. A1C was also drawn and was pending at the time of discharge.   Hypothyroidism: Patient on 130mcg of synthroid daily at home. TSH was 0.189 on 11/30, so synthroid decreased to 185mcg daily prior to discharge. Recheck TSH in 6 weeks.   Sleep Apnea: Continue home CPAP. Hypoxia at home may have contributed to patient's episode of confusion, however patient maintained O2 Sats in the 90s while on CPAP overnight.   Chronic Pain: Patient has a history of chronic pain treated with chronic opioids, specifically oxycodone ER 20mg  TID, acetaminophen 325 two tablets  TID, and oxycodone IR 5mg  PRN.  Patient received Dilaudid 1mg  q8hrs inpatient and home medications were restarted prior to discharge. Chronic opioid use may contribute to decreased respiratory drive and hypoxia in this patient, particularly with his underlying sleep apnea. Patient will follow up with his primary care doctor.   Anxiety & Depression: Patient on Alprazolam 1mg  TID and Lexapro 20mg  daily. Concern that polypharmacy may contribute to confusion in this patient. Patient will follow up with his primary care provider.  LE Edema: On admission, patient complained of increased lower extremity swelling and his wife reported a history of 25lb weight gain in 4 months. CXR showed no pulmonary edema and a stable cardiomediastinal silhouette. Patient had an echocardiogram at the [facility] prior to admission and the patient should follow up with his primary care provider outpatient.   Discharge Vitals:   BP 138/79 mmHg  Pulse 63  Temp(Src) 97.8 F (36.6 C) (Oral)  Resp 18  Ht 5\' 8"  (1.727 m)  SpO2 100%  Discharge Labs:  No results found for this or any previous visit (from the past 24 hour(s)).  Signed: Maryellen Pile, MD 09/12/2015, 2:13 PM

## 2015-09-11 NOTE — Progress Notes (Signed)
Subjective: No acute events overnight. His wife reports that he complained of a headache but he reports that his pain is otherwise well-controlled. Per his wife he is less confused this morning and his speech is not as slurred. He did respond inapropriately to questions at times however. For example when asked how he feels this morning, patient replied "half." His face is symmetrical this morning. His wife reports that he is mostly sedentary at home and becomes short of breath walking to the bathroom at baseline.   Objective: Vital signs in last 24 hours: Filed Vitals:   09/10/15 2347 09/11/15 0139 09/11/15 0318 09/11/15 0506  BP: 130/63 150/61 140/52 138/79  Pulse: 60 58 61 63  Temp: 98.4 F (36.9 C) 97.6 F (36.4 C) 97.8 F (36.6 C) 97.8 F (36.6 C)  TempSrc: Oral Oral Oral Oral  Resp: 18 17 20 18   Height:      SpO2: 93% 96% 95% 100%   Weight change:   Intake/Output Summary (Last 24 hours) at 09/11/15 1153 Last data filed at 09/11/15 [id number]  Gross per 24 hour  Intake      0 ml  Output    400 ml  Net   -400 ml   Constitutional: Well nourished man, laying in bed, in NAD Cardiovascular: RRR, no murmurs appreciated Respiratory: Clear to auscultation bilaterally.  Abdomen: Soft, non-tender, nondistended, no rebound, guarding, +BS Extremities: Pulses 2+ symmetric, 1+ LE edema to the knee.  Neuro: A&O x2 (aware of year and month, but not day). CNII-XII grossly intact. Face symmetric, no facial droop appreciated. Sensation intact to light touch bilaterally. 5/5 strength bilaterally in the upper and lower extremities. Normal gait.   Lab Results: TSH 0.189 WBC 8.9 Cr 1.60 Total Cholesterol 156 Triglycerides 238 HDL 27 LDL 81  Micro Results: No results found for this or any previous visit (from the past 240 hour(s)). Studies/Results: Ct Head Wo Contrast  09/10/2015  CLINICAL DATA:  Confusion. Left facial droop. Report of several recent falls. EXAM: CT HEAD WITHOUT CONTRAST  TECHNIQUE: Contiguous axial images were obtained from the base of the skull through the vertex without intravenous contrast. COMPARISON:  Head CT October 08, 2015; brain MRI October 13, 2012 FINDINGS: There is mild diffuse atrophy. There is no intracranial mass, hemorrhage, extra-axial fluid collection, or midline shift. There is slight small vessel disease in the centra semiovale bilaterally. There is evidence of a prior small lacunar infarct in the mid right extreme capsule. Elsewhere gray-white compartments appear normal. There is no acute infarct evident. The bony calvarium appears intact. Mastoids are hypoplastic but clear bilaterally. Orbits appear symmetric and normal bilaterally. IMPRESSION: Mild atrophy with slight periventricular small vessel disease. Prior small lacunar infarct in the midportion of the right extreme capsule. No intracranial mass, hemorrhage, or evidence suggesting acute infarct. Electronically Signed   By: Lowella Grip III M.D.   On: 09/10/2015 11:30   Mr [name] Contrast  09/10/2015  CLINICAL DATA:  Increased confusion, facial droop, and generalized weakness. Slurred speech lasting 2 minutes. EXAM: MRI HEAD WITHOUT CONTRAST TECHNIQUE: Multiplanar, multiecho pulse sequences of the brain and surrounding structures were obtained without intravenous contrast. COMPARISON:  CT head without contrast from the same day. MRI brain 10/13/2012. FINDINGS: The diffusion-weighted images demonstrate no evidence for acute or subacute infarction. No acute hemorrhage or mass lesion is present. Age advanced atrophy and white matter disease is present. The ventricles are proportionate to the degree of atrophy. No significant extra-axial fluid collection is present. The  brainstem is within normal limits. A remote lacunar infarct is present in the right cerebellum. The internal auditory canals are within normal limits. Flow is present in the major intracranial arteries. The globes and orbits are  intact. The paranasal sinuses and mastoid air cells are clear. The skullbase is within normal limits. Midline structures are unremarkable. IMPRESSION: 1. No acute intracranial abnormality. 2. Age advanced atrophy and white matter disease likely reflects the sequela of chronic microvascular ischemia. Electronically Signed   By: San Morelle M.D.   On: 09/10/2015 17:57   Dg Chest Portable 1 View  09/10/2015  CLINICAL DATA:  64 year old male with weakness, chest pain and shortness of breath EXAM: PORTABLE CHEST 1 VIEW COMPARISON:  Prior chest x-ray 10/07/2012 FINDINGS: Stable cardiac and mediastinal contours. The lungs are clear. No pulmonary edema, pleural effusion, focal airspace consolidation or pneumothorax. No acute osseous abnormality. IMPRESSION: No active disease. Electronically Signed   By: Jacqulynn Cadet M.D.   On: 09/10/2015 12:12   Medications: I have reviewed the patient's current medications. Only change: patient will be discharged on levothyroxine 173mcg daily.  Scheduled Meds: . aspirin  81 mg Oral Daily  . aspirin  325 mg Oral Once  . atorvastatin  80 mg Oral q1800  . divalproex  500 mg Oral Q12H  . heparin  5,000 Units Subcutaneous 3 times per day  . [START ON 09/12/2015] levothyroxine  112 mcg Oral QAC breakfast   Continuous Infusions:  PRN Meds:.HYDROmorphone (DILAUDID) injection, LORazepam, ondansetron (ZOFRAN) IV, senna-docusate Assessment/Plan: Active Problems:   Facial droop  Russell Housman is a 64yo man with a PMH significant for lacunar and cerebellar CVA, vascular dementia, depression, anxiety, chronic back pain, severe sleep apnea, stage 2-3 CDK, PAD and hypothyroid who presented with an episode of shaking, slurred speech, confusion and left-sided facial droop, most likely secondary to TIA.   Facial Droop, Convulsion, and Confusion Episode, likely TIA: now resolved on our examination. Differential includes TIA/stroke, seizure, infection, or hypoxia. Infection  less likely despite low grade fever at presentation as patient has had no further fevers, had no leukocytosis and UA and CXR were unremarkable.  Seizure also less likely as patient was able to speak throughout his episode. Hypoxia from sleep apnea or chronic opiates/polypharmacy could have contributed to acute confusion, however, this presentation appears more consistent with TIA or stroke. Patient does have history of prior lacunar and cerebellar CVA. Likely more TIA with facial droop that resolved and no focal deficits appreciated on exam. CT showed no evidence of acute intracranial process and MRI showed no evidence of acute stroke. - Continue daily aspirin 81mg . - Start Atorvastatin 80mg  daily. Patient will follow up with his PCP and adjust the dose outpatient if needed.  - Continue Valproate 500mg  q12h. - Falls precautions. PT/OT evaluation prior to discharge.  -A1C was drawn this admission and is pending. Patient will follow up with his PCP.   Sleep Apnea: Continue home CPAP. Hypoxia at home may have contributed to patient's episode of confusion, however patient maintained O2 Sats in the 90s while on CPAP overnight.   Chronic Pain: Patient has a history of chronic pain treated with chronic opioids, specifically oxycodone ER 20mg  TID, acetaminophen 325 two tablets TID, and oxycodone IR 5mg  PRN.  -Restart home meds as patient can tolerate PO.   Anxiety & Depression: Patient on Alprazolam 1mg  TID and Lexapro 20mg  daily. -Restart home meds as patient can tolerate PO.   Hypothyroidism: TSH was 0.189 on 11/30, which suggests that  patient's home synthroid dose of 154mcg daily may be too high.  -Decrease synthroid dose to 193mcg daily prior to discharge.  -Recheck TSH in 6 weeks.   LE Edema: CXR showed no pulmonary edema and a stable cardiomediastinal silhouette. Patient's lungs clear on exam and he endorses no SOB. Patient has a smoking history, HTN and PAD which could indicate an increased risk  of ischemic cardiomyopathy but it is unlikely that the patient is going through an acute HF exacerbation at this time. Patient had an echocardi[facility] at the [facility] prior to admission however per his wife it has not been read yet.  -Patient will follow up with his PCP.   FEN: Regular diet. DVT PPx: heparin Code: DNR  Dispo: Anticipate discharge today.   This is a Careers information officer Note.  The care of the patient was discussed with Dr. Charlynn Grimes and the assessment and plan formulated with their assistance.  Please see their attached note for official documentation of the daily encounter.   LOS: 1 day   Rogelia Boga, Med Student 09/11/2015, 11:53 AM

## 2015-09-12 LAB — HEMOGLOBIN A1C
Hgb A1c MFr Bld: 5.8 % — ABNORMAL HIGH (ref 4.8–5.6)
Mean Plasma Glucose: 120 mg/dL

## 2015-09-30 ENCOUNTER — Encounter (HOSPITAL_COMMUNITY): Payer: Self-pay | Admitting: Emergency Medicine

## 2015-09-30 ENCOUNTER — Observation Stay (HOSPITAL_COMMUNITY)
Admission: EM | Admit: 2015-09-30 | Discharge: 2015-10-02 | Disposition: A | Discharge status: Home / Self Care | Payer: Medicare Other | Attending: Student in an Organized Health Care Education/Training Program | Admitting: Student in an Organized Health Care Education/Training Program

## 2015-09-30 ENCOUNTER — Emergency Department (HOSPITAL_COMMUNITY): Payer: Medicare Other

## 2015-09-30 DIAGNOSIS — N183 Chronic kidney disease, stage 3 (moderate): Secondary | ICD-10-CM | POA: Diagnosis not present

## 2015-09-30 DIAGNOSIS — R4182 Altered mental status, unspecified: Secondary | ICD-10-CM

## 2015-09-30 DIAGNOSIS — I129 Hypertensive chronic kidney disease with stage 1 through stage 4 chronic kidney disease, or unspecified chronic kidney disease: Secondary | ICD-10-CM | POA: Insufficient documentation

## 2015-09-30 DIAGNOSIS — E039 Hypothyroidism, unspecified: Secondary | ICD-10-CM | POA: Diagnosis not present

## 2015-09-30 DIAGNOSIS — G8929 Other chronic pain: Secondary | ICD-10-CM | POA: Diagnosis not present

## 2015-09-30 DIAGNOSIS — Z87891 Personal history of nicotine dependence: Secondary | ICD-10-CM | POA: Insufficient documentation

## 2015-09-30 DIAGNOSIS — G4733 Obstructive sleep apnea (adult) (pediatric): Secondary | ICD-10-CM | POA: Insufficient documentation

## 2015-09-30 DIAGNOSIS — G9340 Encephalopathy, unspecified: Secondary | ICD-10-CM | POA: Diagnosis not present

## 2015-09-30 DIAGNOSIS — F0150 Vascular dementia without behavioral disturbance: Secondary | ICD-10-CM | POA: Diagnosis not present

## 2015-09-30 DIAGNOSIS — F1120 Opioid dependence, uncomplicated: Secondary | ICD-10-CM

## 2015-09-30 DIAGNOSIS — R29810 Facial weakness: Secondary | ICD-10-CM | POA: Insufficient documentation

## 2015-09-30 DIAGNOSIS — F419 Anxiety disorder, unspecified: Secondary | ICD-10-CM | POA: Insufficient documentation

## 2015-09-30 DIAGNOSIS — Z9119 Patient's noncompliance with other medical treatment and regimen: Secondary | ICD-10-CM | POA: Diagnosis not present

## 2015-09-30 DIAGNOSIS — W19XXXA Unspecified fall, initial encounter: Secondary | ICD-10-CM | POA: Diagnosis not present

## 2015-09-30 DIAGNOSIS — R531 Weakness: Secondary | ICD-10-CM | POA: Diagnosis present

## 2015-09-30 DIAGNOSIS — Z66 Do not resuscitate: Secondary | ICD-10-CM | POA: Insufficient documentation

## 2015-09-30 DIAGNOSIS — R4781 Slurred speech: Secondary | ICD-10-CM | POA: Insufficient documentation

## 2015-09-30 DIAGNOSIS — Z79899 Other long term (current) drug therapy: Secondary | ICD-10-CM

## 2015-09-30 DIAGNOSIS — R262 Difficulty in walking, not elsewhere classified: Secondary | ICD-10-CM | POA: Insufficient documentation

## 2015-09-30 DIAGNOSIS — F329 Major depressive disorder, single episode, unspecified: Secondary | ICD-10-CM | POA: Diagnosis not present

## 2015-09-30 DIAGNOSIS — F1320 Sedative, hypnotic or anxiolytic dependence, uncomplicated: Secondary | ICD-10-CM

## 2015-09-30 DIAGNOSIS — Z85828 Personal history of other malignant neoplasm of skin: Secondary | ICD-10-CM | POA: Insufficient documentation

## 2015-09-30 DIAGNOSIS — Z8673 Personal history of transient ischemic attack (TIA), and cerebral infarction without residual deficits: Secondary | ICD-10-CM | POA: Diagnosis not present

## 2015-09-30 LAB — I-STAT VENOUS BLOOD GAS, ED
Acid-Base Excess: 9 mmol/L — ABNORMAL HIGH (ref 0.0–2.0)
Bicarbonate: 37 mEq/L — ABNORMAL HIGH (ref 20.0–24.0)
O2 Saturation: 76 %
TCO2: 39 mmol/L (ref 0–100)
pCO2, Ven: 69.2 mmHg — ABNORMAL HIGH (ref 45.0–50.0)
pH, Ven: 7.336 — ABNORMAL HIGH (ref 7.250–7.300)
pO2, Ven: 45 mmHg (ref 30.0–45.0)

## 2015-09-30 LAB — DIFFERENTIAL
Basophils Absolute: 0.1 10*3/uL (ref 0.0–0.1)
Basophils Relative: 1 %
Eosinophils Absolute: 0.3 10*3/uL (ref 0.0–0.7)
Eosinophils Relative: 5 %
Lymphocytes Relative: 21 %
Lymphs Abs: 1.5 10*3/uL (ref 0.7–4.0)
Monocytes Absolute: 0.9 10*3/uL (ref 0.1–1.0)
Monocytes Relative: 12 %
Neutro Abs: 4.4 10*3/uL (ref 1.7–7.7)
Neutrophils Relative %: 61 %

## 2015-09-30 LAB — COMPREHENSIVE METABOLIC PANEL
ALT: 24 U/L (ref 17–63)
AST: 21 U/L (ref 15–41)
Albumin: 3.4 g/dL — ABNORMAL LOW (ref 3.5–5.0)
Alkaline Phosphatase: 60 U/L (ref 38–126)
Anion gap: 9 (ref 5–15)
BUN: 14 mg/dL (ref 6–20)
CO2: 34 mmol/L — ABNORMAL HIGH (ref 22–32)
Calcium: 8.9 mg/dL (ref 8.9–10.3)
Chloride: 96 mmol/L — ABNORMAL LOW (ref 101–111)
Creatinine, Ser: 1.65 mg/dL — ABNORMAL HIGH (ref 0.61–1.24)
GFR calc Af Amer: 49 mL/min — ABNORMAL LOW (ref >60)
GFR calc non Af Amer: 43 mL/min — ABNORMAL LOW (ref >60)
Glucose, Bld: 100 mg/dL — ABNORMAL HIGH (ref 65–99)
Potassium: 3.6 mmol/L (ref 3.5–5.1)
Sodium: 139 mmol/L (ref 135–145)
Total Bilirubin: 0.6 mg/dL (ref 0.3–1.2)
Total Protein: 5.8 g/dL — ABNORMAL LOW (ref 6.5–8.1)

## 2015-09-30 LAB — CBG MONITORING, ED: Glucose-Capillary: 96 mg/dL (ref 65–99)

## 2015-09-30 LAB — I-STAT CG4 LACTIC ACID, ED
Lactic Acid, Venous: 1.62 mmol/L (ref 0.5–2.0)
Lactic Acid, Venous: 0.71 mmol/L (ref 0.5–2.0)

## 2015-09-30 LAB — CBC
HCT: 40.2 % (ref 39.0–52.0)
Hemoglobin: 13.6 g/dL (ref 13.0–17.0)
MCH: 29.4 pg (ref 26.0–34.0)
MCHC: 33.8 g/dL (ref 30.0–36.0)
MCV: 86.8 fL (ref 78.0–100.0)
Platelets: 159 10*3/uL (ref 150–400)
RBC: 4.63 MIL/uL (ref 4.22–5.81)
RDW: 12.7 % (ref 11.5–15.5)
WBC: 7 10*3/uL (ref 4.0–10.5)

## 2015-09-30 LAB — VALPROIC ACID LEVEL: Valproic Acid Lvl: 65 ug/mL (ref 50.0–100.0)

## 2015-09-30 LAB — URINALYSIS, ROUTINE W REFLEX MICROSCOPIC
Bilirubin Urine: NEGATIVE
Glucose, UA: NEGATIVE mg/dL
Hgb urine dipstick: NEGATIVE
Ketones, ur: 15 mg/dL — AB
Leukocytes, UA: NEGATIVE
Nitrite: NEGATIVE
Protein, ur: NEGATIVE mg/dL
Specific Gravity, Urine: 1.022 (ref 1.005–1.030)
pH: 7 (ref 5.0–8.0)

## 2015-09-30 NOTE — ED Provider Notes (Signed)
CSN: [id number]     Arrival date & time 09/30/15  1738 History   First MD Initiated Contact with Patient 09/30/15 1830     Chief Complaint  Patient presents with  . Altered Mental Status  . Weakness     (Consider location/radiation/quality/duration/timing/severity/associated sxs/prior Treatment) HPI 64 year old male who presents with altered mental status and generalized weakness. He has a history of prior CVA, vascular demential, ? Lewy body dementia, severe central sleep apnea, and chronic pain. Recently discharged 12/1 for evaluation of TIA. Had negative MRI. Discharged to home, where his wife states that he has had progressive decline, acutely worsened over past 2 days. She reports that over 1 week ago he had an episode where he developed one hour of left-sided facial droop, disorientation, and gait instability. I gave him a aspirin, and his symptoms gradually resolved. States that during this time he has been primarily bed bound, with progressive generalized weakness and inability to walk with a walker or cane as he normally is able to. He contused his has increased apnea episodes despite use of CPAP at home. States that he has primarily been sleeping in bed bound. She does report that he had a fever one to 2 days ago of 101.9 Fahrenheit. He has had no recurrence of fever. He has not had cough, congestion, sore throat, runny nose, chest pain, nausea or vomiting, abdominal pain, diarrhea, dysuria or urinary frequency. He did briefly have a headache with this fever, which is now resolved.     Past Medical History  Diagnosis Date  . Skin cancer     basal cell on back x2  . Lewy body dementia   . Dyspnea   . Dizziness   . Orthostatic hypotension   . Anxiety   . Depression   . Hypothyroidism   . Osgood-Schlatter's disease   . Back pain     chronic  . Degenerative disc disease   . Arthritis   . Tachycardia     heart monitor last week for 48 hours no report yet  . Jaundice with  stoppage of bile flow     after stent romoved  . Elevated LFTs   . Sleep apnea with use of continuous positive airway pressure (CPAP)   . Memory deficits 04/23/2013  . Chronic kidney disease     stage III  renal disease   . Restless leg syndrome 04/23/2013  . Dupuytren's contracture of right hand   . Erectile dysfunction    Past Surgical History  Procedure Laterality Date  . Gallbladder surgery  2007-2008  . Basal cell carcinoma excision  [id number]  . Lumbar fusion  1996  . Cervical fusion  1985  . Carpal tunnel release  2003  . Tonsillectomy    . Appendectomy    . Cholecystectomy    . Liver biopsy  12/27/2011    Procedure: LIVER BIOPSY;  Surgeon: Inda Castle, MD;  Location: [facility];  Service: Endoscopy;  Laterality: N/A;  pt moved from 3/22 to 3/18 ( aw)   Family History  Problem Relation Age of Onset  . Coronary artery disease Mother   . Heart attack Mother     stents in neck and heart ?  Marland Kitchen Crohn's disease Mother   . Cirrhosis Sister   . Cirrhosis Father   . Prostate cancer Maternal Uncle   . Liver cancer Cousin   . Colitis Daughter   . Diabetes Maternal Uncle   . Diabetes Brother   . Kidney disease Maternal  Aunt   . Kidney disease Cousin    Social History  Substance Use Topics  . Smoking status: Former Smoker    Quit date: 12/27/1975  . Smokeless tobacco: Never Used  . Alcohol Use: No    Review of Systems 10/14 systems reviewed and are negative other than those stated in the HPI    Allergies  Vesicare; Aricept; Lamictal; Namenda; Oxycodone hcl; Sulfur; Exelon; Fentanyl; and Penicillins  Home Medications   Prior to Admission medications   Medication Sig Start Date End Date Taking? Authorizing Provider  ALPRAZolam Duanne Moron) 1 MG tablet Take 1 mg by mouth 3 (three) times daily.    Yes Historical Provider, MD  aspirin 81 MG chewable tablet Chew 1 tablet (81 mg total) by mouth daily. 09/11/15  Yes Maryellen Pile, MD  divalproex (DEPAKOTE) 500 MG DR  tablet Take 500 mg by mouth 2 (two) times daily.   Yes Historical Provider, MD  hydrochlorothiazide (HYDRODIURIL) 25 MG tablet Take 25 mg by mouth daily.   Yes Historical Provider, MD  hydrOXYzine (ATARAX/VISTARIL) 10 MG tablet Take 10 mg by mouth 3 (three) times daily.    Yes Historical Provider, MD  ketotifen (ZADITOR) 0.025 % ophthalmic solution Place 1 drop into both eyes daily as needed.   Yes Historical Provider, MD  levothyroxine (SYNTHROID, LEVOTHROID) 100 MCG tablet Take 1 tablet (100 mcg total) by mouth daily before breakfast. 09/12/15  Yes Maryellen Pile, MD  loperamide (IMODIUM A-D) 2 MG tablet Take 2 mg by mouth 4 (four) times daily as needed for diarrhea or loose stools.   Yes Historical Provider, MD  ondansetron (ZOFRAN) 8 MG tablet Take 8 mg by mouth every 8 (eight) hours as needed for nausea or vomiting.   Yes Historical Provider, MD  oxycodone (OXY-IR) 5 MG capsule Take 5 mg by mouth every 4 (four) hours as needed. For breakthrough pain per wife   Yes Historical Provider, MD  oxyCODONE (ROXICODONE) 15 MG immediate release tablet Take 15 mg by mouth every 6 (six) hours.   Yes Historical Provider, MD  Oxycodone HCl 20 MG TABS Take 1 tablet by mouth daily.   Yes Historical Provider, MD  pantoprazole (PROTONIX) 40 MG tablet Take 40 mg by mouth at bedtime.   Yes Historical Provider, MD  pregabalin (LYRICA) 50 MG capsule Take 50 mg by mouth 3 (three) times daily.   Yes Historical Provider, MD  atorvastatin (LIPITOR) 80 MG tablet Take 1 tablet (80 mg total) by mouth daily at 6 PM. Patient not taking: Reported on 09/30/2015 09/11/15   Maryellen Pile, MD  carbamazepine (TEGRETOL) 200 MG tablet One tablet in the morning and two tablets in the evening Patient not taking: Reported on 09/10/2015 04/24/14   Kathrynn Ducking, MD   BP 136/77 mmHg  Pulse 66  Temp(Src) 98.5 F (36.9 C) (Oral)  Resp 11  SpO2 97% Physical Exam Physical Exam  Nursing note and vitals reviewed. Constitutional:  Listless, well nourished, non-toxic, and in no acute distress Head: Normocephalic and atraumatic.  Mouth/Throat: Oropharynx is clear and moist.  Neck: Normal range of motion. Neck supple.  no meningismus. Cardiovascular: Normal rate and regular rhythm.   Pulmonary/Chest: Effort normal. CTAB. Intermittent episodes of hypopnea and apnea while sleeping Abdominal: Soft. There is no tenderness. There is no rebound and no guarding.  Musculoskeletal: Normal range of motion.  Skin: Skin is warm and dry.  Psychiatric: Cooperative Neurological:  Alert, oriented to person, place, time. Memory grossly in tact. Fluent speech but speech  is slowed. No dysarthria or aphasia.  Cranial nerves: VF are full. Pupils are symmetric, and reactive to light. EOMI without nystagmus. No gaze deviation. Facial muscles symmetric with activation. Sensation to light touch over face in tact bilaterally. Hearing grossly in tact. Palate elevates symmetrically. Head turn and shoulder shrug are intact. Tongue midline.  Reflexes defered.  Muscle bulk and tone normal. No pronator drift. Moves all extremities symmetrically and holds all extremities without weakness against gravity. Sensation to light touch is in tact throughout in bilateral upper and lower extremities. Coordination reveals no dysmetria with finger to nose. Unable to ambulate   ED Course  Procedures (including critical care time) Labs Review Labs Reviewed  COMPREHENSIVE METABOLIC PANEL - Abnormal; Notable for the following:    Chloride 96 (*)    CO2 34 (*)    Glucose, Bld 100 (*)    Creatinine, Ser 1.65 (*)    Total Protein 5.8 (*)    Albumin 3.4 (*)    GFR calc non Af Amer 43 (*)    GFR calc Af Amer 49 (*)    All other components within normal limits  URINALYSIS, ROUTINE W REFLEX MICROSCOPIC (NOT AT [facility]) - Abnormal; Notable for the following:    Ketones, ur 15 (*)    All other components within normal limits  I-STAT VENOUS BLOOD GAS, ED - Abnormal;  Notable for the following:    pH, Ven 7.336 (*)    pCO2, Ven 69.2 (*)    Bicarbonate 37.0 (*)    Acid-Base Excess 9.0 (*)    All other components within normal limits  CBC  DIFFERENTIAL  BLOOD GAS, VENOUS  VALPROIC ACID LEVEL  TSH  CBG MONITORING, ED  I-STAT CG4 LACTIC ACID, ED  I-STAT CG4 LACTIC ACID, ED    Imaging Review Dg Chest 2 View  09/30/2015  CLINICAL DATA:  Stroke symptoms and fever EXAM: CHEST - 2 VIEW COMPARISON:  09/10/2015 FINDINGS: The heart size and mediastinal contours are within normal limits. Both lungs are clear. The visualized skeletal structures are unremarkable. IMPRESSION: No active disease. Electronically Signed   By: Inez Catalina M.D.   On: 09/30/2015 18:29   Ct Head Wo Contrast  09/30/2015  CLINICAL DATA:  Altered mental status and behavioral changes EXAM: CT HEAD WITHOUT CONTRAST TECHNIQUE: Contiguous axial images were obtained from the base of the skull through the vertex without intravenous contrast. COMPARISON:  09/10/2015 FINDINGS: The bony calvarium is intact. No gross soft tissue abnormality is noted. Mild atrophic changes are seen. No findings to suggest acute hemorrhage, acute infarction or space-occupying mass lesion are noted. IMPRESSION: Chronic atrophic changes without acute abnormality. Electronically Signed   By: Inez Catalina M.D.   On: 09/30/2015 20:14   I have personally reviewed and evaluated these images and lab results as part of my medical decision-making.   EKG Interpretation   Date/Time:  Tuesday September 30 2015 17:43:42 EST Ventricular Rate:  77 PR Interval:  134 QRS Duration: 86 QT Interval:  368 QTC Calculation: 416 R Axis:   33 Text Interpretation:  Normal sinus rhythm T wave abnormality, consider  lateral ischemia Abnormal ECG No significant change since last tracing  Confirmed by Namita Yearwood MD, Erven Ramson [phone]) on 09/30/2015 6:58:56 PM      MDM   Final diagnoses:  Generalized weakness  Altered mental status, unspecified  altered mental status type    64 year old male with history of CVA, vascular dementia, central sleep apnea and chronic pain who presents the emergency  department with generalized weakness and intermittent altered mental status since recent discharge from the hospital in early December. Presentation he is nontoxic in no acute distress. Appears listless and somnolent, but easily arouses to voice and answers questions appropriately. He has no gross neurological deficits. Exam is otherwise unremarkable and nonfocal. He is afebrile here and hemodynamically stable. Without evidence of infection as his chest x-ray is clear and UA does not show evidence of infection. He has no leukocytosis and his lactate is also normal. Presentation does not seem consistent with that of encephalitis currently or meningitis, and I do not believe that he requires LP. There are no other major left light or metabolic derangements noted on his blood work. VBG also shows evidence of chronic CO2 retention with compensation, and not acute cause of presentation. Unclear of etiology of his decline. Discussed with internal medicine team and admitted for ongoing management and workup    Forde Dandy, MD 09/30/15 2354

## 2015-09-30 NOTE — ED Notes (Signed)
Pt here with increased AMS and TIA sx that started yesterday; pt sts fever; per wife pt has had some behavioral changes and has a hx of avascular dementia

## 2015-10-01 DIAGNOSIS — Z9989 Dependence on other enabling machines and devices: Secondary | ICD-10-CM

## 2015-10-01 DIAGNOSIS — G9340 Encephalopathy, unspecified: Secondary | ICD-10-CM | POA: Diagnosis present

## 2015-10-01 DIAGNOSIS — R531 Weakness: Secondary | ICD-10-CM

## 2015-10-01 DIAGNOSIS — E039 Hypothyroidism, unspecified: Secondary | ICD-10-CM

## 2015-10-01 DIAGNOSIS — I10 Essential (primary) hypertension: Secondary | ICD-10-CM

## 2015-10-01 DIAGNOSIS — G40909 Epilepsy, unspecified, not intractable, without status epilepticus: Secondary | ICD-10-CM

## 2015-10-01 DIAGNOSIS — R4182 Altered mental status, unspecified: Secondary | ICD-10-CM | POA: Insufficient documentation

## 2015-10-01 DIAGNOSIS — G4733 Obstructive sleep apnea (adult) (pediatric): Secondary | ICD-10-CM | POA: Diagnosis not present

## 2015-10-01 DIAGNOSIS — Z7401 Bed confinement status: Secondary | ICD-10-CM

## 2015-10-01 DIAGNOSIS — G8929 Other chronic pain: Secondary | ICD-10-CM

## 2015-10-01 DIAGNOSIS — F419 Anxiety disorder, unspecified: Secondary | ICD-10-CM

## 2015-10-01 LAB — VITAMIN B12: Vitamin B-12: 924 pg/mL — ABNORMAL HIGH (ref 180–914)

## 2015-10-01 LAB — T4, FREE: Free T4: 0.8 ng/dL (ref 0.61–1.12)

## 2015-10-01 LAB — TSH: TSH: 6.666 u[IU]/mL — ABNORMAL HIGH (ref 0.350–4.500)

## 2015-10-01 LAB — RPR: RPR Ser Ql: NONREACTIVE

## 2015-10-01 MED ORDER — ALPRAZOLAM 0.5 MG PO TABS
0.5000 mg | ORAL_TABLET | Freq: Three times a day (TID) | ORAL | Status: DC | PRN
  Administered 2015-10-02: 0.5 mg via ORAL
  Filled 2015-10-01: qty 1

## 2015-10-01 MED ORDER — ATORVASTATIN CALCIUM 80 MG PO TABS
80.0000 mg | ORAL_TABLET | Freq: Every day | ORAL | Status: DC
  Administered 2015-10-01: 80 mg via ORAL
  Filled 2015-10-01: qty 1

## 2015-10-01 MED ORDER — ASPIRIN 81 MG PO CHEW
81.0000 mg | CHEWABLE_TABLET | Freq: Every day | ORAL | Status: DC
  Administered 2015-10-01 – 2015-10-02 (×2): 81 mg via ORAL
  Filled 2015-10-01 (×2): qty 1

## 2015-10-01 MED ORDER — ASPIRIN 81 MG PO CHEW: 81.0000 mg | CHEWABLE_TABLET | Freq: Every day | ORAL | Status: DC

## 2015-10-01 MED ORDER — DIVALPROEX SODIUM 500 MG PO DR TAB
500.0000 mg | DELAYED_RELEASE_TABLET | Freq: Two times a day (BID) | ORAL | Status: DC
  Administered 2015-10-01 – 2015-10-02 (×4): 500 mg via ORAL
  Filled 2015-10-01 (×4): qty 1

## 2015-10-01 MED ORDER — LEVOTHYROXINE SODIUM 100 MCG PO TABS
100.0000 ug | ORAL_TABLET | Freq: Every day | ORAL | Status: DC
  Administered 2015-10-01 – 2015-10-02 (×2): 100 ug via ORAL
  Filled 2015-10-01 (×2): qty 1

## 2015-10-01 MED ORDER — OXYCODONE HCL ER 10 MG PO T12A
20.0000 mg | EXTENDED_RELEASE_TABLET | Freq: Two times a day (BID) | ORAL | Status: DC
  Administered 2015-10-01: 20 mg via ORAL
  Filled 2015-10-01: qty 2

## 2015-10-01 MED ORDER — ALPRAZOLAM 0.5 MG PO TABS: 0.5000 mg | ORAL_TABLET | Freq: Three times a day (TID) | ORAL | Status: DC

## 2015-10-01 MED ORDER — ALPRAZOLAM 0.5 MG PO TABS
1.0000 mg | ORAL_TABLET | Freq: Three times a day (TID) | ORAL | Status: DC
  Administered 2015-10-01: 1 mg via ORAL
  Filled 2015-10-01: qty 2

## 2015-10-01 MED ORDER — OXYCODONE HCL 5 MG PO TABS
5.0000 mg | ORAL_TABLET | Freq: Three times a day (TID) | ORAL | Status: DC | PRN
  Administered 2015-10-02: 5 mg via ORAL
  Filled 2015-10-01: qty 1

## 2015-10-01 MED ORDER — SENNOSIDES-DOCUSATE SODIUM 8.6-50 MG PO TABS
1.0000 | ORAL_TABLET | Freq: Two times a day (BID) | ORAL | Status: DC
  Administered 2015-10-01 – 2015-10-02 (×2): 1 via ORAL
  Filled 2015-10-01 (×3): qty 1

## 2015-10-01 MED ORDER — ENOXAPARIN SODIUM 40 MG/0.4ML ~~LOC~~ SOLN
40.0000 mg | SUBCUTANEOUS | Status: DC
  Administered 2015-10-01: 40 mg via SUBCUTANEOUS
  Filled 2015-10-01: qty 0.4

## 2015-10-01 MED ORDER — PREGABALIN 25 MG PO CAPS: 50.0000 mg | ORAL_CAPSULE | Freq: Three times a day (TID) | ORAL | Status: DC

## 2015-10-01 NOTE — Evaluation (Signed)
Physical Therapy Evaluation Patient Details Name: Hayden Rose MRN: [id number] DOB: [birth year] Today's Date: 10/01/2015   History of Present Illness  Pt adm with episode of convulsing, slurred speech, confusion, and left-sided facial droop. MRI negative for acute event. PMH - Cerebellar CVA, dementia, chronic pain.    Clinical Impression  Pt presented with above diagnosis. Patient has been using standard walker at home. Educated in use of RW as pt reports limited walking due to back pain. Patient is interested in obtaining RW--unsure of process with [facility] benefits. Noted falls at home post last d/c. Pt currently with functional limitations due to the deficits listed below (see PT Problem List).  Pt will benefit from skilled PT to increase their independence and safety with mobility to allow discharge to the venue listed below.       Follow Up Recommendations Home health PT;Supervision/Assistance - 24 hour (recent falls)    Equipment Recommendations  Rolling walker with 5" wheels (due to back pain with use of standard walker)    Recommendations for Other Services       Precautions / Restrictions Precautions Precautions: Fall Restrictions Weight Bearing Restrictions: No      Mobility  Bed Mobility Overal bed mobility: Needs Assistance Bed Mobility: Sidelying to Sit;Rolling Rolling: Supervision Sidelying to sit: Supervision       General bed mobility comments: Incr time and effort; +rail   Transfers Overall transfer level: Needs assistance Equipment used: Rolling walker (2 wheeled) Transfers: Sit to/from Stand Sit to Stand: Supervision         General transfer comment: vc for safe hand placement  Ambulation/Gait Ambulation/Gait assistance: Min guard Ambulation Distance (Feet): 15 Feet (sit, 25) Assistive device: Rolling walker (2 wheeled) Gait Pattern/deviations: Step-through pattern;Decreased stride length;Shuffle Gait velocity: slow   General Gait Details: vc  for proper use of RW  Stairs            Wheelchair Mobility    Modified Rankin (Stroke Patients Only)       Balance Overall balance assessment: History of Falls Sitting-balance support: No upper extremity supported;Feet supported Sitting balance-Leahy Scale: Good     Standing balance support: No upper extremity supported Standing balance-Leahy Scale: Fair                               Pertinent Vitals/Pain Pain Assessment: Faces Faces Pain Scale: Hurts even more Pain Location: head and back (both chronic) Pain Descriptors / Indicators: Aching Pain Intervention(s): Limited activity within patient's tolerance;Monitored during session;Repositioned    Home Living Family/patient expects to be discharged to:: Private residence Living Arrangements: Spouse/significant other;Parent Available Help at Discharge: Family   Home Access: Stairs to enter   Technical brewer of Steps: 3 Home Layout: One level Home Equipment: Walker - standard;Cane - single point;Hospital bed      Prior Function Level of Independence: Needs assistance   Gait / Transfers Assistance Needed: Amb household distances with walker or cane.           Hand Dominance        Extremity/Trunk Assessment   Upper Extremity Assessment: Overall WFL for tasks assessed           Lower Extremity Assessment: Overall WFL for tasks assessed (Lt slightly stronger than Rt)      Cervical / Trunk Assessment: Normal  Communication   Communication: No difficulties  Cognition Arousal/Alertness: Lethargic Behavior During Therapy: Flat affect Overall Cognitive Status: No family/caregiver  present to determine baseline cognitive functioning                      General Comments      Exercises        Assessment/Plan    PT Assessment Patient needs continued PT services  PT Diagnosis Difficulty walking   PT Problem List Decreased balance;Decreased mobility;Decreased  knowledge of use of DME;Decreased safety awareness;Obesity;Pain  PT Treatment Interventions DME instruction;Gait training;Stair training;Functional mobility training;Therapeutic activities;Balance training;Patient/family education   PT Goals (Current goals can be found in the Care Plan section) Acute Rehab PT Goals Patient Stated Goal: none stated; very flat PT Goal Formulation: With patient Time For Goal Achievement: 10/08/15 Potential to Achieve Goals: Good    Frequency Min 3X/week   Barriers to discharge        Co-evaluation               End of Session Equipment Utilized During Treatment: Gait belt Activity Tolerance: Patient tolerated treatment well Patient left: with call bell/phone within reach;in chair;with chair alarm set Nurse Communication: Mobility status    Functional Assessment Tool Used: clinical judgement Functional Limitation: Mobility: Walking and moving around Mobility: Walking and Moving Around Current Status [phone]): At least 20 percent but less than 40 percent impaired, limited or restricted Mobility: Walking and Moving Around Goal Status [phone]): At least 1 percent but less than 20 percent impaired, limited or restricted    Time: 1117-1140 PT Time Calculation (min) (ACUTE ONLY): 23 min   Charges:   PT Evaluation $Initial PT Evaluation Tier I: 1 Procedure PT Treatments $Gait Training: 8-22 mins   PT G Codes:   PT G-Codes **NOT FOR INPATIENT CLASS** Functional Assessment Tool Used: clinical judgement Functional Limitation: Mobility: Walking and moving around Mobility: Walking and Moving Around Current Status [id number]): At least 20 percent but less than 40 percent impaired, limited or restricted Mobility: Walking and Moving Around Goal Status [phone]): At least 1 percent but less than 20 percent impaired, limited or restricted    Trecia Maring 10/01/2015, 11:51 AM Pager [phone]

## 2015-10-01 NOTE — Progress Notes (Signed)
Subjective: Patient is very somnolent this morning. Falling asleep during our conversation. Easily arousable but slow to answer. Oriented to person and place but unable to recall year. Not easily distractible. Unable to basic calculations. Only complaints this morning are ongoing chronic back pain.   Objective: Vital signs in last 24 hours: Filed Vitals:   10/01/15 0304 10/01/15 0330 10/01/15 0541 10/01/15 1031  BP: 133/73  155/87 143/58  Pulse: 67 72 65 78  Temp: 98.9 F (37.2 C)  98 F (36.7 C) 98.3 F (36.8 C)  TempSrc: Oral  Oral Oral  Resp: 18  20 20   Height: 5\' 8"  (1.727 m)     Weight: 242 lb 6.4 oz (109.952 kg)     SpO2: 99% 98% 97% 100%   Weight change:   Intake/Output Summary (Last 24 hours) at 10/01/15 1417 Last data filed at 10/01/15 0300  Gross per 24 hour  Intake      0 ml  Output    415 ml  Net   -415 ml    PHYSICAL EXAM GENERAL- somnolent, falling asleep during conversation, easily arousbale in no acute distress. HEENT- Atraumatic, normocephalic CARDIAC- RRR, no murmurs, rubs or gallops. RESP- Moving equal volumes of air, and clear to auscultation bilaterally, no wheezes or crackles. ABDOMEN- Soft, nontender, bowel sounds present. NEURO- No obvious Cr N abnormality. EXTREMITIES- pulse 2+, symmetric, no pedal edema. SKIN- Warm, dry, No rash or lesion. PSYCH- Somnolent, Depressed mood and affect  Medications: I have reviewed the patient's current medications. Scheduled Meds: . aspirin  81 mg Oral Daily  . atorvastatin  80 mg Oral q1800  . divalproex  500 mg Oral BID  . enoxaparin (LOVENOX) injection  40 mg Subcutaneous Q24H  . levothyroxine  100 mcg Oral QAC breakfast  . senna-docusate  1 tablet Oral BID   Continuous Infusions:  PRN Meds:.ALPRAZolam, oxyCODONE Assessment/Plan: Active Problems:   Acute encephalopathy   Altered mental status  Acute on chronic encephalopathy: CT head with no acute abnormalities, chronic atrophic changes. Labs  unremarkable. Renal function stable. UA clear and CXR with no acute abnormalities. Suspect his altered mental status is most likely related to polypharmacy with multiple centrally acting medications. He is on long acting narcotics as well as benzos. Will limit alprazolam and oxycodone as much as tolerated. Will speak with [facility] to clarify his med list and come up dispo plan.  - PT/OT - Oxy IR 5 mg q8hr prn - Alprazolam 0.5 mg tid prn  Sleep apnea:- on home cpap though non-compliant. His chronic hypoxia may be contributing to the patient's confusion and weakness. Satting 100% on room air.  -continue cpap qhs  Hypothyroidism: TSH 6.6. T4 wnl -continue synthroid 100 mcg daily  Seizures- has not reported seizures  -continue depakote 500 mg bid  Chronic Pain: Patient has a history of chronic pain treated with chronic opioids, specifically oxycodone ER 20mg  TID, acetaminophen 325 two tablets TID, and oxycodone IR 5mg  PRN. Will limit as much as possible.  -Oxy IR 5 mg q8hr prn -d/c lyrica   Anxiety and Depression: Patient on Alprazolam 1mg  TID. Polypharmacy may contribute to confusion in this patient. -Decrease to xanax 0.5 mg tid  HTN: BP stable overnight. Holding home meds. Will continue to monitor. On HCTZ 25 mg at home.   DVT ppx: lovenox  Code: DNR  Dispo: Disposition is deferred at this time, awaiting improvement of current medical problems.  Anticipated discharge in approximately 1-2 day(s).   The patient does have a  current PCP (Pcp Not In System) and does need an [facility] hospital follow-up appointment after discharge.  The patient does not have transportation limitations that hinder transportation to clinic appointments.  .Services Needed at time of discharge: Y = Yes, Blank = No PT:   OT:   RN:   Equipment:   Other:       Maryellen Pile, MD IMTS PGY-1 [phone] 10/01/2015, 2:17 PM

## 2015-10-01 NOTE — Care Management Note (Signed)
Case Management Note  Patient Details  Name: Hayden Rose MRN: [id number] Date of Birth: 10-Oct-1951  Subjective/Objective:    Patient admitted with acute encephalopathy. Patient is from home with his wife. No PCP on file and pt is active with [facility].                Action/Plan: CM spoke with [place] at [facility] and informed them of patients admission. CM also found that patients primary MD at the [facility] is Dr. Romero Liner. Awaiting PT/OT recommendations. CM will continue to follow for discharge needs.  Expected Discharge Date:                  Expected Discharge Plan:     In-House Referral:     Discharge planning Services     Post Acute Care Choice:    Choice offered to:     DME Arranged:    DME Agency:     HH Arranged:    HH Agency:     Status of Service:  In process, will continue to follow  Medicare Important Message Given:    Date Medicare IM Given:    Medicare IM give by:    Date Additional Medicare IM Given:    Additional Medicare Important Message give by:     If discussed at [place] of Stay Meetings, dates discussed:    Additional Comments:  Pollie Friar, RN 10/01/2015, 1:44 PM

## 2015-10-01 NOTE — ED Notes (Signed)
Admitting MD in to assess pt for admission 

## 2015-10-01 NOTE — Progress Notes (Signed)
Pt transferred to unit from ED via NT x 1. Pt alert and oriented upon arrival. No complaints of pain or discomfort. No signs or symptoms of acute distress. Pt connected to telemetry and central monitoring notified. Pt oriented to unit as well as unit procedures. Pt now resting in bed at lowest position, bed alarm on, call light in reach. Will continue to monitor. Fortino Sic, RN, BSN 10/01/2015 3:43 AM

## 2015-10-01 NOTE — H&P (Signed)
Date: 10/01/2015               Patient Name:  Hayden Rose MRN: [id number]  DOB: Apr 30, 1951 Age / Sex: 64 y.o., male   PCP: Pcp Not In System         Medical Service: Internal Medicine Teaching Service         Attending Physician: Dr. 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 He was recently discharged from this hospital on 12/1 when he presented with episode of shaking, slurred speech and confusion which had resolved by the time he got here.  He had a negative MRI, CT , CXR and UA. No leukocytosis. He was sent with depakote 500 mg bid, daily aspirin 81 mg and to start atorvastatin. His synthroid was also decreased to 100 mcg daily and he was asked to recheck TSH in 6 weeks.   Since being discharged, the wife says that he has fallen several times and she feels that he has had a progressive decline. . Then on the 6th of December, she noticed he had a left sided facial droop and wife gave aspirin and the episode resolved 1 hour after that. She also noticed his speech had slurred during that episode.  Since being discharge, the patient has primarily bed bound. Last 2 nights, he has been waking up at 1:30 Am and has been howling and shouting.  Yesterday morning, he spiked a fever to 101.4 and wife gave him his usual tylenol and antipyretics which resolved the fever. He has not had a fever since.  She says he has also been behaving inappropriately and has been acting out. Today, he did not  remember the breakfast he ate at [employer] he kept saying the year was 1916.   He follows with Neurology dr Leary Rose at the [facility]- wife strongly urged [place] to obtain records from the [place].  She says that the patient was hospitalized at the [facility] by his psychiatrist in July 2015 because he was on a lot of morphine and because the morphine was not doing anything for the pain. Then about 4 months back, they had transitioned the morphine to the oxycontin and oxycodone which he has been on ever since. She says that he gets oxycontin everyday, and the oxycodone 1 hour before the home health nurse who comes to give a bath 3 times a week through the [place]  He also has major depression and has good and bad days and he sleeps a lot. He has central sleep apnea and the wife puts him on the cpap machine but he removes it so there is not much compliance with it. He was apparently diagnosed with lewy body dementia in 2007 then he was put on high dose neurontin which wife says "did damage to the brain", then it was switched to [place] and now she does not think it is Lewy body dementia.  Patient also has vivid dreams every day and he  kicks and screams. He does not report hearing any voices. He says the Lyrica has helped a lot with the " post-stroke pain ".He has herniated disc and 4 hemangiomas and for these reasons he is taking the narcotics.   She denies any changes to the medicines except the atorvastatin which she recently filled.  He endorses chronic shortness of breath, fluid retention in the legs and arms, but no n/v/d/c. He is chronically incontinent. He has bowel movements 4 times a day. He also had some headache. She says he fell and hit his head but she could not see any injury. She has not noticed him having any seizures. No urinary complaints. Last time, patient had complained of difficulty regulating his body temperature   In the ED, patient had very slow mentation, almost if he was not making any effort to think or  answer the questions, but when pressed for answers he would answer it. CBC was unremarkable CMP revealed creatine of 1.65 and baseline around 1.5 2 years ago. LFTs were normal. Valproic acid level was normal. UA was unremarkable, and vitals were stable and afebrile. . CT head showed Chronic atrophic changes without acute abnormality. 2 view chest xray was unremarkable.   FH: He currently lives at home with his wife, mother, 2 daughters and 4 grand kids. Patient's mother is alive at the age of 67 with stroke, heart disease, hypertension, thyroid disease and depression. The patient's father died in his 39's of alcoholism. Hayden Rose has one brother and two sisters. His brother is narcotic and alcohol dependent. His sister died in 35. She was murdered. There is a strong family history of depression on the maternal side with depression in the maternal grandfather and maternal aunt as well as his mother.    SH: 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 He did smoke heavily when he was young, 2-3 packs per day but stopped at the age of 27.  Has used marijuana in the past. 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   Meds: No current facility-administered medications for this encounter.   Current Outpatient Prescriptions  Medication Sig Dispense Refill  . ALPRAZolam (XANAX) 1 MG tablet Take 1 mg by mouth 3 (three) times daily.     Marland Kitchen aspirin 81 MG chewable tablet Chew 1 tablet (81 mg total) by mouth daily. 30 tablet 0  . divalproex (DEPAKOTE) 500 MG DR tablet Take 500 mg by mouth 2 (two) times daily.    . hydrochlorothiazide (HYDRODIURIL) 25 MG tablet Take 25 mg by mouth daily.    . hydrOXYzine (ATARAX/VISTARIL) 10 MG tablet Take 10 mg by mouth 3 (three) times daily.     Marland Kitchen ketotifen (ZADITOR) 0.025 % ophthalmic solution Place 1 drop into both eyes daily as needed.    Marland Kitchen levothyroxine  (SYNTHROID, LEVOTHROID) 100 MCG tablet Take 1 tablet (100 mcg total) by mouth daily before breakfast. 30 tablet 1  . loperamide (IMODIUM A-D) 2 MG tablet Take 2 mg by mouth 4 (four) times daily as needed for diarrhea or loose stools.    . ondansetron (ZOFRAN) 8 MG tablet Take 8 mg by mouth every 8 (eight) hours as needed for nausea or vomiting.    Marland Kitchen oxycodone (OXY-IR) 5 MG capsule Take 5 mg by mouth every 4 (four) hours as needed. For breakthrough pain per wife    .  oxyCODONE (ROXICODONE) 15 MG immediate release tablet Take 15 mg by mouth every 6 (six) hours.    . Oxycodone HCl 20 MG TABS Take 1 tablet by mouth daily.    . pantoprazole (PROTONIX) 40 MG tablet Take 40 mg by mouth at bedtime.    . pregabalin (LYRICA) 50 MG capsule Take 50 mg by mouth 3 (three) times daily.    Marland Kitchen atorvastatin (LIPITOR) 80 MG tablet Take 1 tablet (80 mg total) by mouth daily at 6 PM. (Patient not taking: Reported on 09/30/2015) 30 tablet 0  . carbamazepine (TEGRETOL) 200 MG tablet One tablet in the morning and two tablets in the evening (Patient not taking: Reported on 09/10/2015) 90 tablet 0    Allergies: Allergies as of 09/30/2015 - Review Complete 09/30/2015  Allergen Reaction Noted  . Vesicare [solifenacin succinate] Anaphylaxis 12/08/2011  . Aricept [donepezil hydrochloride] Nausea And Vomiting and Other (See Comments) 12/08/2011  . Lamictal [lamotrigine]  12/10/2011  . Namenda [memantine hcl] Other (See Comments) 12/08/2011  . Oxycodone hcl Other (See Comments) 07/18/2009  . Sulfur Swelling 07/18/2009  . Exelon [rivastigmine] Rash 04/02/2014  . Fentanyl Rash 04/02/2014  . Penicillins Rash 07/18/2009   Past Medical History  Diagnosis Date  . Skin cancer     basal cell on back x2  . Lewy body dementia   . Dyspnea   . Dizziness   . Orthostatic hypotension   . Anxiety   . Depression   . Hypothyroidism   . Osgood-Schlatter's disease   . Back pain     chronic  . Degenerative disc disease   .  Arthritis   . Tachycardia     heart monitor last week for 48 hours no report yet  . Jaundice with stoppage of bile flow     after stent romoved  . Elevated LFTs   . Sleep apnea with use of continuous positive airway pressure (CPAP)   . Memory deficits 04/23/2013  . Chronic kidney disease     stage III  renal disease   . Restless leg syndrome 04/23/2013  . Dupuytren's contracture of right hand   . Erectile dysfunction    Past Surgical History  Procedure Laterality Date  . Gallbladder surgery  2007-2008  . Basal cell carcinoma excision  [id number]  . Lumbar fusion  1996  . Cervical fusion  1985  . Carpal tunnel release  2003  . Tonsillectomy    . Appendectomy    . Cholecystectomy    . Liver biopsy  12/27/2011    Procedure: LIVER BIOPSY;  Surgeon: Inda Castle, MD;  Location: [facility];  Service: Endoscopy;  Laterality: N/A;  pt moved from 3/22 to 3/18 ( aw)   Family History  Problem Relation Age of Onset  . Coronary artery disease Mother   . Heart attack Mother     stents in neck and heart ?  Marland Kitchen Crohn's disease Mother   . Cirrhosis Sister   . Cirrhosis Father   . Prostate cancer Maternal Uncle   . Liver cancer Cousin   . Colitis Daughter   . Diabetes Maternal Uncle   . Diabetes Brother   . Kidney disease Maternal Aunt   . Kidney disease Cousin    Social History   Social History  . Marital Status: Married    Spouse Name: Barnetta Chapel  . Number of Children: 2  . Years of Education: 9th   Occupational History  . disabled    Social History Main Topics  . Smoking  status: Former Smoker    Quit date: 12/27/1975  . Smokeless tobacco: Never Used  . Alcohol Use: No  . Drug Use: No  . Sexual Activity: Yes    Birth Control/ Protection: Condom   Other Topics Concern  . Not on file   Social History Narrative   Patient is married Barnetta Chapel) and lives at home with his wife and his mother.   Patient is disabled.   Patient has a 9th grade education.   Patient is  right-handed.   Patient does not drink any caffeine.   Patient has two adult childre   Does not regularly exercise.     Review of Systems: A complete 10 point ROS was obtained and is negative except as noted in HPI  Physical Exam: Blood pressure 138/89, pulse 64, temperature 98.5 F (36.9 C), temperature source Oral, resp. rate 13, SpO2 100 %.  General: A&O, in NAD, resting comfortably in bed HEENT: EOMI, PERRLA, has orange ring around the pupil  Neck: obese CV: RRR, normal s1, s2, no m/r/g,  Resp: equal and symmetric breath sounds, no wheezing heard Abdomen: obese, nontender, nondistended, +BS in all 4 quadrants,  Skin: warm, dry, intact, no open lesions or rashes noted Extremities: pulses intact b/l, 1+ pitting edema, clubbing or cyanosis, no calf tenderness His hands have been chronically swollen, but no clubbing  Neurologic: alert and oriented to name, place, year, and location. Did not try to spell world backwards though could spell it forewards  CN II-XII grossly intact Sensory: intact to light touch in all extremities Motor: 5/5 strength in upper, 5/5 in lower extremities Cerebellar: FNF intact   Lab results: Results for orders placed or performed during the hospital encounter of 09/30/15 (from the past 24 hour(s))  I-Stat CG4 Lactic Acid, ED     Status: None   Collection Time: 09/30/15  6:42 PM  Result Value Ref Range   Lactic Acid, Venous 1.62 0.5 - 2.0 mmol/L  Comprehensive metabolic panel     Status: Abnormal   Collection Time: 09/30/15  6:47 PM  Result Value Ref Range   Sodium 139 135 - 145 mmol/L   Potassium 3.6 3.5 - 5.1 mmol/L   Chloride 96 (L) 101 - 111 mmol/L   CO2 34 (H) 22 - 32 mmol/L   Glucose, Bld 100 (H) 65 - 99 mg/dL   BUN 14 6 - 20 mg/dL   Creatinine, Ser 1.65 (H) 0.61 - 1.24 mg/dL   Calcium 8.9 8.9 - 10.3 mg/dL   Total Protein 5.8 (L) 6.5 - 8.1 g/dL   Albumin 3.4 (L) 3.5 - 5.0 g/dL   AST 21 15 - 41 U/L   ALT 24 17 - 63 U/L   Alkaline  Phosphatase 60 38 - 126 U/L   Total Bilirubin 0.6 0.3 - 1.2 mg/dL   GFR calc non Af Amer 43 (L) >60 mL/min   GFR calc Af Amer 49 (L) >60 mL/min   Anion gap 9 5 - 15  CBC     Status: None   Collection Time: 09/30/15  6:47 PM  Result Value Ref Range   WBC 7.0 4.0 - 10.5 K/uL   RBC 4.63 4.22 - 5.81 MIL/uL   Hemoglobin 13.6 13.0 - 17.0 g/dL   HCT 40.2 39.0 - 52.0 %   MCV 86.8 78.0 - 100.0 fL   MCH 29.4 26.0 - 34.0 pg   MCHC 33.8 30.0 - 36.0 g/dL   RDW 12.7 11.5 - 15.5 %   Platelets  159 150 - 400 K/uL  Differential     Status: None   Collection Time: 09/30/15  6:47 PM  Result Value Ref Range   Neutrophils Relative % 61 %   Neutro Abs 4.4 1.7 - 7.7 K/uL   Lymphocytes Relative 21 %   Lymphs Abs 1.5 0.7 - 4.0 K/uL   Monocytes Relative 12 %   Monocytes Absolute 0.9 0.1 - 1.0 K/uL   Eosinophils Relative 5 %   Eosinophils Absolute 0.3 0.0 - 0.7 K/uL   Basophils Relative 1 %   Basophils Absolute 0.1 0.0 - 0.1 K/uL  CBG monitoring, ED     Status: None   Collection Time: 09/30/15  7:19 PM  Result Value Ref Range   Glucose-Capillary 96 65 - 99 mg/dL  Urinalysis, Routine w reflex microscopic (not at [facility])     Status: Abnormal   Collection Time: 09/30/15  7:25 PM  Result Value Ref Range   Color, Urine YELLOW YELLOW   APPearance CLEAR CLEAR   Specific Gravity, Urine 1.022 1.005 - 1.030   pH 7.0 5.0 - 8.0   Glucose, UA NEGATIVE NEGATIVE mg/dL   Hgb urine dipstick NEGATIVE NEGATIVE   Bilirubin Urine NEGATIVE NEGATIVE   Ketones, ur 15 (A) NEGATIVE mg/dL   Protein, ur NEGATIVE NEGATIVE mg/dL   Nitrite NEGATIVE NEGATIVE   Leukocytes, UA NEGATIVE NEGATIVE  I-Stat CG4 Lactic Acid, ED     Status: None   Collection Time: 09/30/15  9:15 PM  Result Value Ref Range   Lactic Acid, Venous 0.71 0.5 - 2.0 mmol/L  I-Stat venous blood gas, ED     Status: Abnormal   Collection Time: 09/30/15 10:22 PM  Result Value Ref Range   pH, Ven 7.336 (H) 7.250 - 7.300   pCO2, Ven 69.2 (H) 45.0 - 50.0 mmHg    pO2, Ven 45.0 30.0 - 45.0 mmHg   Bicarbonate 37.0 (H) 20.0 - 24.0 mEq/L   TCO2 39 0 - 100 mmol/L   O2 Saturation 76.0 %   Acid-Base Excess 9.0 (H) 0.0 - 2.0 mmol/L   Patient temperature HIDE    Sample type VENOUS    Comment NOTIFIED PHYSICIAN   Valproic acid level     Status: None   Collection Time: 09/30/15 11:15 PM  Result Value Ref Range   Valproic Acid Lvl 65 50.0 - 100.0 ug/mL  TSH     Status: Abnormal   Collection Time: 09/30/15 11:15 PM  Result Value Ref Range   TSH 6.666 (H) 0.350 - 4.500 uIU/mL     Imaging results:  Dg Chest 2 View  09/30/2015  CLINICAL DATA:  Stroke symptoms and fever EXAM: CHEST - 2 VIEW COMPARISON:  09/10/2015 FINDINGS: The heart size and mediastinal contours are within normal limits. Both lungs are clear. The visualized skeletal structures are unremarkable. IMPRESSION: No active disease. Electronically Signed   By: Inez Catalina M.D.   On: 09/30/2015 18:29   Ct Head Wo Contrast  09/30/2015  CLINICAL DATA:  Altered mental status and behavioral changes EXAM: CT HEAD WITHOUT CONTRAST TECHNIQUE: Contiguous axial images were obtained from the base of the skull through the vertex without intravenous contrast. COMPARISON:  09/10/2015 FINDINGS: The bony calvarium is intact. No gross soft tissue abnormality is noted. Mild atrophic changes are seen. No findings to suggest acute hemorrhage, acute infarction or space-occupying mass lesion are noted. IMPRESSION: Chronic atrophic changes without acute abnormality. Electronically Signed   By: Inez Catalina M.D.   On: 09/30/2015 20:14  Assessment & Plan by Problem: Active Problems:   * No active hospital problems. *  Chronic Altered mental status/ Acute on chronic encephalopathy with Weakness and deconditioning:  Do not think he has an infection as no leukocytosis and afebrile. CT head unremarkable. UA [place] clear. CXR is clear. Lactate normal. Valproic acid level normal. He has evidence of chronic hypoxia due to chronic  co2 retention due to sleep apnea but this is not acute.   I think his altered mental status Is multifactorial. Being bed-bound has contributed to the patient's weakness, as well as his years of pain medicines as he has been requiring more of it.  I think that his depression and anxiety is playing a huge role in the patient's current status. At one point per chart review, ECT was considered but unsure if this was followed upon  Also, in the room, I could see that the wife was dictating him and he was doing what the wife said, and he would not utter a word without looking at his wife, so it seemed odd to me. Wife was incredibly frustrated and it seemed to me she wanted to just drop him here for few days to get some rest as she promptly went home after the H&P. It seemed to me that he was listless and just did not have the desire to do anything but when repeatedly pressing for answers, he would tell me the exacts things- he was able to say the  year 2016, his name and where he was born. And also the month and location, so he seemed fully alert to me.  His sleep apnea is also contributing to his status as there is evidence of chronic hypoxia with bicarb. His non-compliance with the cpap is causing his lot of symptoms.  Another reason may be his hypothyroidism. His most recent TSH is 6.6 so it looks like he is not getting his synthroid or the right way. Last time he was discharged, the synthroid was decreased to 100 mcg daily, and wife reported compliance with it.  I am not sure about his TIA symptoms- it is possible that he had a TIA that resolved as he has a lot of risk factors like elevated triglycerides, htn. He does have a history of lacunar and cerebellar CVA as showing in one of the CT scans.  He also has a history of dementia- vascular, and at one point lewy body was considered, and int he past he was on neurontin, geodon, flexeril, tramadol and namenda.   -ordered B12 and RPR  -may need to be  educated on how to take the synthroid. -cpap and monitor pulse ox  -will need PT -he may benefit from psychiatry consult, and also ECT might be useful to him - he will benefit greatly from palliative care consult to determine what his goals of care are   Sleep apnea:- on home cpap. His chronic hypoxia may be contributing to the patient's confusion and weakness. His O2 sat was in 90s upon examination. -continue cpap.  Hypothyroidism: Patient on 168mcg of synthroid daily at home. TSH was 0.189 on 11/30, so synthroid decreased to 162mcg daily prior to discharge  -continue synthroid  Seizures- has not reported seizures  -continue depakote 500 mg b id , not on tegretol anymore   Chronic Pain: Patient has a history of chronic pain treated with chronic opioids, specifically oxycodone ER 20mg  TID, acetaminophen 325 two tablets TID, and oxycodone IR 5mg  PRN.  -continue oxycontin 20 mg tid,  and tylenol  -continue lyrica   Anxiety and Depression: Patient on Alprazolam 1mg  TID. Polypharmacy may contribute to confusion in this patient. Wife wishes that patient would get more xanax as he has been anxious. -continue xanax -is he taking lexapro- please obtain records?  HTN: on home hctz   DVT ppx: lovenox Code- DNR/I  Dispo: Disposition is deferred at this time, awaiting improvement of current medical problems. Anticipated discharge in approximately 2 day(s).   The patient does have a current PCP (Pcp Not In System) and does need an [facility] hospital follow-up appointment after discharge.  The patient does not have transportation limitations that hinder transportation to clinic appointments.  Signed: Burgess Estelle, MD 10/01/2015, 12:42 AM

## 2015-10-02 ENCOUNTER — Encounter (HOSPITAL_COMMUNITY): Payer: Self-pay | Admitting: Emergency Medicine

## 2015-10-02 DIAGNOSIS — Z79899 Other long term (current) drug therapy: Secondary | ICD-10-CM

## 2015-10-02 DIAGNOSIS — G9340 Encephalopathy, unspecified: Secondary | ICD-10-CM

## 2015-10-02 DIAGNOSIS — Z79891 Long term (current) use of opiate analgesic: Secondary | ICD-10-CM | POA: Diagnosis not present

## 2015-10-02 DIAGNOSIS — F1320 Sedative, hypnotic or anxiolytic dependence, uncomplicated: Secondary | ICD-10-CM

## 2015-10-02 DIAGNOSIS — F1120 Opioid dependence, uncomplicated: Secondary | ICD-10-CM

## 2015-10-02 LAB — BASIC METABOLIC PANEL
Anion gap: 12 (ref 5–15)
BUN: 15 mg/dL (ref 6–20)
CO2: 26 mmol/L (ref 22–32)
Calcium: 8.7 mg/dL — ABNORMAL LOW (ref 8.9–10.3)
Chloride: 97 mmol/L — ABNORMAL LOW (ref 101–111)
Creatinine, Ser: 1.45 mg/dL — ABNORMAL HIGH (ref 0.61–1.24)
GFR calc Af Amer: 58 mL/min — ABNORMAL LOW (ref >60)
GFR calc non Af Amer: 50 mL/min — ABNORMAL LOW (ref >60)
Glucose, Bld: 98 mg/dL (ref 65–99)
Potassium: 4.1 mmol/L (ref 3.5–5.1)
Sodium: 135 mmol/L (ref 135–145)

## 2015-10-02 MED ORDER — PREGABALIN 25 MG PO CAPS
50.0000 mg | ORAL_CAPSULE | Freq: Three times a day (TID) | ORAL | Status: DC
  Administered 2015-10-02: 50 mg via ORAL
  Filled 2015-10-02: qty 2

## 2015-10-02 MED ORDER — ALPRAZOLAM 1 MG PO TABS: 1.0000 mg | ORAL_TABLET | Freq: Three times a day (TID) | ORAL | Status: DC | PRN

## 2015-10-02 MED ORDER — ALPRAZOLAM 0.5 MG PO TABS: 0.5000 mg | ORAL_TABLET | Freq: Three times a day (TID) | ORAL | Status: DC | PRN

## 2015-10-02 NOTE — Discharge Instructions (Addendum)
I believe your altered mental status is in large part secondary to the medications you are taking that also have a lot of sedating effects. Please use the Xanax 1 mg three times a day as needed and no longer have it scheduled. I would also like you to stop the extended release opiates and use the immediate release opiates to control your pain. The extended release may be over sedating and we can get a better sense of how much pain medications you need with the immediate release medications. Please keep track of how much you are needing and take it to your next doctor's appointment. Continue your other medications as before.   It is also very important that you work with Physical Therapy. Deconditioning happens very quickly and being bed bound has negative impacts on your health and quality of life.   He did not have any drops in hs O2 saturation at rest or with activity. He would not qualify for any home oxygen at this time. Please use the CPAP machine at night to help with your breathing.   [facility]

## 2015-10-02 NOTE — Progress Notes (Signed)
Occupational Therapy Evaluation/Discharge Patient Details Name: Hayden Rose MRN: [id number] DOB: 09-09-1951 Today's Date: 10/02/2015    History of Present Illness Pt adm with episode of convulsing, slurred speech, confusion, and left-sided facial droop. MRI negative for acute event. PMH - Cerebellar CVA, dementia, chronic pain.   Clinical Impression   PTA, pt required varying levels of assistance for ADLs and mobility. Pt currently presents with acute back, neck and head pain and required supervision to complete ADLs and functional mobility. Pt's wife very concerned about pt's O2 sats on arrival to ED and when he sleeps and she believes pt needs O2 at home. Pt's O2 levels stayed between 96-100% at rest and with activity during session - advised pt/wife to follow up with PCP to address O2 needs. Pt has no further acute OT needs and pt has no further questions. OT signing off. Thank you for this referral.    Follow Up Recommendations  No OT follow up    Equipment Recommendations  None recommended by OT    Recommendations for Other Services       Precautions / Restrictions Precautions Precautions: Fall Restrictions Weight Bearing Restrictions: No      Mobility Bed Mobility Overal bed mobility: Needs Assistance Bed Mobility: Supine to Sit     Supine to sit: Supervision     General bed mobility comments: HOB flat, use of bedrail, increased time and effort  Transfers Overall transfer level: Needs assistance Equipment used: Rolling walker (2 wheeled) Transfers: Sit to/from Stand Sit to Stand: Supervision         General transfer comment: vc for safe hand placement    Balance Overall balance assessment: History of Falls Sitting-balance support: No upper extremity supported;Feet supported Sitting balance-Leahy Scale: Good     Standing balance support: No upper extremity supported;During functional activity Standing balance-Leahy Scale: Fair                               ADL Overall ADL's : At baseline                                       General ADL Comments: Pt's wife reports that he waxes and wanes with amount of assistance needed - sometimes he is mod I and sometimes he is max assist. Pt's wife is extremely worried about pt's O2 levels - pt maintained O2 sats between 96-100% while ambulating 13ft and completing transfers and ADLs.     Vision Vision Assessment?: Yes Eye Alignment: Within Functional Limits Ocular Range of Motion: Within Functional Limits Alignment/Gaze Preference: Within Defined Limits Tracking/Visual Pursuits: Decreased smoothness of horizontal tracking;Decreased smoothness of vertical tracking Saccades: Within functional limits Convergence: Within functional limits Visual Fields: No apparent deficits   Perception     Praxis      Pertinent Vitals/Pain Pain Assessment: 0-10 Pain Score: 8  Pain Location: back, neck, headache Pain Descriptors / Indicators: Headache;Aching;Sore;Throbbing Pain Intervention(s): Limited activity within patient's tolerance;Monitored during session;Repositioned     Hand Dominance Right   Extremity/Trunk Assessment Upper Extremity Assessment Upper Extremity Assessment: Overall WFL for tasks assessed   Lower Extremity Assessment Lower Extremity Assessment: Overall WFL for tasks assessed   Cervical / Trunk Assessment Cervical / Trunk Assessment: Normal   Communication Communication Communication: No difficulties   Cognition Arousal/Alertness: Awake/alert Behavior During Therapy: Flat affect Overall Cognitive Status: History  of cognitive impairments - at baseline                     General Comments       Exercises       Shoulder Instructions      Home Living Family/patient expects to be discharged to:: Private residence Living Arrangements: Spouse/significant other Available Help at Discharge: Family;Available 24 hours/day Type of  Home: House Home Access: Stairs to enter [employer] of Steps: 3   Home Layout: One level     Bathroom Shower/Tub: Walk-in shower;Door   [employer] Toilet: Standard     Home Equipment: Walker - standard;Cane - single point;Bedside commode;Shower seat (Adjustable bed)          Prior Functioning/Environment Level of Independence: Needs assistance  Gait / Transfers Assistance Needed: Amb household distances with walker or cane. ADL's / Homemaking Assistance Needed: Wife routinely assists dressing and toileting, aide from [place] assists with bathing, wife performs meal prep and housekeeping.        OT Diagnosis: Cognitive deficits;Acute pain   OT Problem List: Decreased activity tolerance;Impaired balance (sitting and/or standing);Decreased coordination;Decreased cognition;Decreased safety awareness;Decreased knowledge of use of DME or AE;Obesity;Pain   OT Treatment/Interventions:      OT Goals(Current goals can be found in the care plan section) Acute Rehab OT Goals Patient Stated Goal: none stated; very flat OT Goal Formulation: With patient Time For Goal Achievement: 10/16/15 Potential to Achieve Goals: Good  OT Frequency:     Barriers to D/C:            Co-evaluation              End of Session Equipment Utilized During Treatment: Gait belt;Rolling walker Nurse Communication: Mobility status;Other (comment) (O2 sats during activity)  Activity Tolerance: Patient tolerated treatment well Patient left: in chair;with call bell/phone within reach;with chair alarm set;with family/visitor present   Time: 1041-1120 OT Time Calculation (min): 39 min Charges:  OT General Charges $OT Visit: 1 Procedure OT Evaluation $Initial OT Evaluation Tier I: 1 Procedure OT Treatments $Self Care/Home Management : 23-37 mins G-Codes: OT G-codes **NOT FOR INPATIENT CLASS** Functional Assessment Tool Used: clinical judgement Functional Limitation: Self care Self Care  Current Status [id number]): At least 1 percent but less than 20 percent impaired, limited or restricted Self Care Goal Status [id number]): At least 1 percent but less than 20 percent impaired, limited or restricted Self Care Discharge Status [id number]): At least 1 percent but less than 20 percent impaired, limited or restricted  Redmond Baseman, OTR/L 10/02/2015, 11:31 AM  Pager: [phone]

## 2015-10-02 NOTE — Discharge Summary (Signed)
Name: Hayden Rose MRN: [id number] DOB: [birth year] 64 y.o. PCP: Pcp Not In System  Date of Admission: 09/30/2015  6:19 PM Date of Discharge: 10/02/2015 Attending Physician: Axel Filler, MD  Discharge Diagnosis: Principal Problem:   Acute encephalopathy Active Problems:   Opioid use disorder, moderate, dependence ([place])   Benzodiazepine dependence ([place])   Polypharmacy  Discharge Medications:   Medication List    ASK your doctor about these medications        ALPRAZolam 1 MG tablet  Commonly known as:  XANAX  Take 1 mg by mouth 3 (three) times daily.     aspirin 81 MG chewable tablet  Chew 1 tablet (81 mg total) by mouth daily.     atorvastatin 80 MG tablet  Commonly known as:  LIPITOR  Take 1 tablet (80 mg total) by mouth daily at 6 PM.     carbamazepine 200 MG tablet  Commonly known as:  TEGRETOL  One tablet in the morning and two tablets in the evening     divalproex 500 MG DR tablet  Commonly known as:  DEPAKOTE  Take 500 mg by mouth 2 (two) times daily.     hydrochlorothiazide 25 MG tablet  Commonly known as:  HYDRODIURIL  Take 25 mg by mouth daily.     hydrOXYzine 10 MG tablet  Commonly known as:  ATARAX/VISTARIL  Take 10 mg by mouth 3 (three) times daily.     ketotifen 0.025 % ophthalmic solution  Commonly known as:  ZADITOR  Place 1 drop into both eyes daily as needed.     levothyroxine 100 MCG tablet  Commonly known as:  SYNTHROID, LEVOTHROID  Take 1 tablet (100 mcg total) by mouth daily before breakfast.     loperamide 2 MG tablet  Commonly known as:  IMODIUM A-D  Take 2 mg by mouth 4 (four) times daily as needed for diarrhea or loose stools.     ondansetron 8 MG tablet  Commonly known as:  ZOFRAN  Take 8 mg by mouth every 8 (eight) hours as needed for nausea or vomiting.     Oxycodone HCl 20 MG Tabs  Take 1 tablet by mouth daily.     oxycodone 5 MG capsule  Commonly known as:  OXY-IR  Take 5 mg by mouth every 4 (four) hours  as needed. For breakthrough pain per wife     oxyCODONE 15 MG immediate release tablet  Commonly known as:  ROXICODONE  Take 15 mg by mouth every 6 (six) hours.     pantoprazole 40 MG tablet  Commonly known as:  PROTONIX  Take 40 mg by mouth at bedtime.     pregabalin 50 MG capsule  Commonly known as:  LYRICA  Take 50 mg by mouth 3 (three) times daily.        Disposition and follow-up:   Hayden Rose was discharged from [facility] in Stable condition.  At the hospital follow up visit please address:  1.  Opioid use disorder: Consider long term care facility given patient's comorbidities and level of care. Insturcted to limit opioid use and keep track of how much they are using to get a better sense of how much he is requiring as he was heavy sedated from home dose. Please consider tapering dose.   2.  Labs / imaging needed at time of follow-up: None  3.  Pending labs/ test needing follow-up: None  Follow-up Appointments:   Discharge Instructions:  Consultations:    Procedures Performed:  Dg Chest 2 View  09/30/2015  CLINICAL DATA:  Stroke symptoms and fever EXAM: CHEST - 2 VIEW COMPARISON:  09/10/2015 FINDINGS: The heart size and mediastinal contours are within normal limits. Both lungs are clear. The visualized skeletal structures are unremarkable. IMPRESSION: No active disease. Electronically Signed   By: Inez Catalina M.D.   On: 09/30/2015 18:29   Ct Head Wo Contrast  09/30/2015  CLINICAL DATA:  Altered mental status and behavioral changes EXAM: CT HEAD WITHOUT CONTRAST TECHNIQUE: Contiguous axial images were obtained from the base of the skull through the vertex without intravenous contrast. COMPARISON:  09/10/2015 FINDINGS: The bony calvarium is intact. No gross soft tissue abnormality is noted. Mild atrophic changes are seen. No findings to suggest acute hemorrhage, acute infarction or space-occupying mass lesion are noted. IMPRESSION: Chronic  atrophic changes without acute abnormality. Electronically Signed   By: Inez Catalina M.D.   On: 09/30/2015 20:14   Ct Head Wo Contrast  09/10/2015  CLINICAL DATA:  Confusion. Left facial droop. Report of several recent falls. EXAM: CT HEAD WITHOUT CONTRAST TECHNIQUE: Contiguous axial images were obtained from the base of the skull through the vertex without intravenous contrast. COMPARISON:  Head CT October 08, 2015; brain MRI October 13, 2012 FINDINGS: There is mild diffuse atrophy. There is no intracranial mass, hemorrhage, extra-axial fluid collection, or midline shift. There is slight small vessel disease in the centra semiovale bilaterally. There is evidence of a prior small lacunar infarct in the mid right extreme capsule. Elsewhere gray-white compartments appear normal. There is no acute infarct evident. The bony calvarium appears intact. Mastoids are hypoplastic but clear bilaterally. Orbits appear symmetric and normal bilaterally. IMPRESSION: Mild atrophy with slight periventricular small vessel disease. Prior small lacunar infarct in the midportion of the right extreme capsule. No intracranial mass, hemorrhage, or evidence suggesting acute infarct. Electronically Signed   By: Lowella Grip III M.D.   On: 09/10/2015 11:30   Mr [name] Contrast  09/10/2015  CLINICAL DATA:  Increased confusion, facial droop, and generalized weakness. Slurred speech lasting 2 minutes. EXAM: MRI HEAD WITHOUT CONTRAST TECHNIQUE: Multiplanar, multiecho pulse sequences of the brain and surrounding structures were obtained without intravenous contrast. COMPARISON:  CT head without contrast from the same day. MRI brain 10/13/2012. FINDINGS: The diffusion-weighted images demonstrate no evidence for acute or subacute infarction. No acute hemorrhage or mass lesion is present. Age advanced atrophy and white matter disease is present. The ventricles are proportionate to the degree of atrophy. No significant extra-axial fluid  collection is present. The brainstem is within normal limits. A remote lacunar infarct is present in the right cerebellum. The internal auditory canals are within normal limits. Flow is present in the major intracranial arteries. The globes and orbits are intact. The paranasal sinuses and mastoid air cells are clear. The skullbase is within normal limits. Midline structures are unremarkable. IMPRESSION: 1. No acute intracranial abnormality. 2. Age advanced atrophy and white matter disease likely reflects the sequela of chronic microvascular ischemia. Electronically Signed   By: San Morelle M.D.   On: 09/10/2015 17:57   Dg Chest Portable 1 View  09/10/2015  CLINICAL DATA:  64 year old male with weakness, chest pain and shortness of breath EXAM: PORTABLE CHEST 1 VIEW COMPARISON:  Prior chest x-ray 10/07/2012 FINDINGS: Stable cardiac and mediastinal contours. The lungs are clear. No pulmonary edema, pleural effusion, focal airspace consolidation or pneumothorax. No acute osseous abnormality. IMPRESSION: No active disease. Electronically Signed  By: Jacqulynn Cadet M.D.   On: 09/10/2015 12:12   Admission HPI: 64 year old man with chronic pain on chronic narcotics, severe depression, and OSA was brought to the emergency department by his wife/caretaker because of progressive confusion. It sounds like he has become progressively inactive and requiring more and more care at home. In 2015 he was spending all his time in his chair at home, now he reports being essentially bed bound, unable to ambulate because of back pain, reliant on his wife for all ADLs. He becomes hyperactive at night and agitated, then is hypoactive during the day.   On exam his vitals are stable. He arouses to voice, slow to answer questions. Mildly disoriented, doesn't know the year. He is able to converse slowly, not distractable. Falls to sleep mid-sentence. 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 Labs were  reviewed in Epic and are reassuring. Renal function is stable. Urinalysis without signs of infection. CT head shows chronic atrophic changes. Chest x-ray with no active disease. ECG is normal sinus rhythm with normal QT interval.   This acute encephalopathy seems most consistent with toxic effect stemming from polypharmacy with multiple centrally acting medications. I am most concerned about the combination of long acting narcotics and benzos. His depression also seems to be contributing. No signs of infection on initial workup. Hold alprazolam and oxycodone for now, can be given as needed. Re-assess mental status later today after medications wash out. Continue Depakote. PT and OT assessments.    Hospital Course by problem list: Active Problems:   Acute encephalopathy   Altered mental status   Acute on chronic encephalopathy: CT head with no acute abnormalities, chronic atrophic changes. Labs unremarkable. Renal function stable. UA clear and CXR with no acute abnormalities. Suspect his altered mental status is most likely related to polypharmacy with multiple centrally acting medications. He is on long acting narcotics as well as benzos. Limited alprazolam and oxycodone as much as tolerated, Patient's mental status was much improved today after holding much of his sedating medications. He was AAOx3, able to do basic calculations and no longer somnolent. His wife is his POA and resistant to change any of his pain medications. She says she has had many conversations with this pain doctors and they understand the risks involve with taking such high doses of opiates and bezos especially to his respiratory drive. She does report she has Narcan at home. She did report that since starting Lyrica he has been having less pain complaints. Continued the Xanax 1 mg tid as a prn instead of scheduled.  Stopped the extended release opiates and advised to use the immediate release opiates to control his pain. The  extended release may be over sedating and hope to get a better sense of how much pain medications heneed with the immediate release medications. Instructed to keep track of how much he is needing and take it to his next doctor's appointment.  Sleep apnea:- on home cpap but non-compliant. His chronic hypoxia may be contributing to the patient's confusion and weakness. 100% on room air here. Discussed the importance of continuing CPAP at home.  Hypothyroidism: TSH 6.6. T4 wnl. Continued synthroid 100 mcg daily. Follow up TSH in 6 weeks.   Seizures- no seizure activity during hospitalization. Continued depakote 500 mg bid   Chronic Pain: Patient has a history of chronic pain treated with chronic opioids, specifically oxycodone ER 20mg  TID, acetaminophen 325 two tablets TID, and oxycodone IR 5mg  PRN.  Limit as much as possible with discharge instructions per above.   Anxiety and Depression: Patient on Alprazolam 1mg  TID. Polypharmacy may contribute to confusion in this patient. Xanax 1 mg tid prn per above.   HTN: BP stable. On HCTZ 25 mg at home, continued home dose.   Discharge Vitals:   BP 130/70 mmHg  Pulse 63  Temp(Src) 98.6 F (37 C) (Oral)  Resp 20  Ht 5\' 8"  (1.727 m)  Wt 242 lb 6.4 oz (109.952 kg)  BMI 36.87 kg/m2  SpO2 100%  Discharge Labs:  Results for orders placed or performed during the hospital encounter of 09/30/15 (from the past 24 hour(s))  Basic metabolic panel     Status: Abnormal   Collection Time: 10/02/15  3:49 AM  Result Value Ref Range   Sodium 135 135 - 145 mmol/L   Potassium 4.1 3.5 - 5.1 mmol/L   Chloride 97 (L) 101 - 111 mmol/L   CO2 26 22 - 32 mmol/L   Glucose, Bld 98 65 - 99 mg/dL   BUN 15 6 - 20 mg/dL   Creatinine, Ser 1.45 (H) 0.61 - 1.24 mg/dL   Calcium 8.7 (L) 8.9 - 10.3 mg/dL   GFR calc non Af Amer 50 (L) >60 mL/min   GFR calc Af Amer 58 (L) >60 mL/min   Anion gap 12 5 - 15    Signed: Maryellen Pile, MD 10/02/2015, 8:41 AM

## 2015-10-02 NOTE — Progress Notes (Signed)
RT note: Pt. placed on CPAP, in Auto mode @20  cmh20/6 cmh20, with room air- 92%, nasal mask, tolerating well, RT to monitor.

## 2015-10-02 NOTE — Progress Notes (Signed)
RT note: Pt. placed on CPAP in Auto mode-20 cmh20 high/6 cmh20 low,with 2 lpm bled in circuit, M/L nasal mask, uses nasal pillows @ home, RT to monitor, RN made aware.

## 2015-10-02 NOTE — Progress Notes (Signed)
Subjective: Hayden Rose is much more alert this morning. No longer falling asleep during our conversation. He is oriented to person, place and time this morning and able to do basic calculations with no issues. He is complaining of back and neck pain today. His wife is at bedside and reports he has become completely bed bound at home over the past month. He was able to work with PT yesterday and reports that he did not have too much pain. Says he would be willing to work with PT at home.   Objective: Vital signs in last 24 hours: Filed Vitals:   10/01/15 2102 10/01/15 2239 10/02/15 0136 10/02/15 0526  BP: 127/77  132/74 130/70  Pulse: 88 77 68 63  Temp: 98.4 F (36.9 C)  98.6 F (37 C) 98.6 F (37 C)  TempSrc: Oral  Oral Oral  Resp: 20 16 20 20   Height:      Weight:      SpO2: 99% 97% 99% 100%   Weight change:   Intake/Output Summary (Last 24 hours) at 10/02/15 [id number] Last data filed at 10/02/15 0225  Gross per 24 hour  Intake      0 ml  Output    525 ml  Net   -525 ml    PHYSICAL EXAM GENERAL- AAox3, cooperative, lying in bed in no acute distress HEENT- Atraumatic, normocephalic CARDIAC- RRR, no murmurs, rubs or gallops. RESP- Moving equal volumes of air, and clear to auscultation bilaterally, no wheezes or crackles. ABDOMEN- Soft, nontender, bowel sounds present. NEURO- No obvious Cr N abnormality. EXTREMITIES- pulse 2+, symmetric, no pedal edema. SKIN- Warm, dry, No rash or lesion. PSYCH- Depressed mood and affect  Medications: I have reviewed the patient's current medications. Scheduled Meds: . aspirin  81 mg Oral Daily  . atorvastatin  80 mg Oral q1800  . divalproex  500 mg Oral BID  . enoxaparin (LOVENOX) injection  40 mg Subcutaneous Q24H  . levothyroxine  100 mcg Oral QAC breakfast  . senna-docusate  1 tablet Oral BID   Continuous Infusions:  PRN Meds:.ALPRAZolam, oxyCODONE Assessment/Plan:  Acute on chronic encephalopathy: Patient's mental status is much  improved today after holding much of his sedating medications. He is AAOx3, able to do basic calculations and no longer somnolent. His wife is his POA and resistant to change any of his pain medications. She says she has had many conversations with this pain doctors and they understand the risks involve with taking such high doses of opiates and bezos especially to his respiratory drive. She does report she has Narcan at home. She does report that since starting Lyrica he has been having less pain complaints.  - Will check ambulatory O2 sats - Oxy IR 5 mg q8hr prn - Alprazolam 0.5 mg tid prn -Restart Lyrica  Sleep apnea:- on home cpap but non-compliant. His chronic hypoxia may be contributing to the patient's confusion and weakness. Satting 100% on room air here.  -continue cpap qhs  Hypothyroidism: TSH 6.6. T4 wnl -continue synthroid 100 mcg daily  Seizures- no seizure activity during hospitalization -continue depakote 500 mg bid  Chronic Pain: Patient has a history of chronic pain treated with chronic opioids, specifically oxycodone ER 20mg  TID, acetaminophen 325 two tablets TID, and oxycodone IR 5mg  PRN. Will limit as much as possible.  -Oxy IR 5 mg q8hr prn -Restart Lycrica  Anxiety and Depression: Patient on Alprazolam 1mg  TID. Polypharmacy may contribute to confusion in this patient. -Continue xanax 0.5 mg tid  HTN: BP stable overnight. Holding home meds. Will continue to monitor. On HCTZ 25 mg at home.   DVT ppx: lovenox  Code: DNR  Dispo: Disposition is deferred at this time, awaiting improvement of current medical problems.  Anticipated discharge in approximately 1-2 day(s).   The patient does have a current PCP (Pcp Not In System) and does need an [facility] hospital follow-up appointment after discharge.  The patient does not have transportation limitations that hinder transportation to clinic appointments.  .Services Needed at time of discharge: Y = Yes, Blank = No PT:   OT:    RN:   Equipment:   Other:      Maryellen Pile, MD IMTS PGY-1 [phone] 10/02/2015, 7:12 AM

## 2015-10-02 NOTE — Care Management Note (Signed)
Case Management Note  Patient Details  Name: LEONID MANUS MRN: [id number] Date of Birth: 1951-02-27  Subjective/Objective:                    Action/Plan: Patient discharging home with orders for home health PT. CM spoke with Noel Gerold at the [facility] and informed her of his discharge and orders for home health PT. Information she requested was faxed to the number provided [phone]). CM met with the patient and the family and informed them of the steps taken and to follow up with the [facility]. Bedside RN updated.   Expected Discharge Date:                  Expected Discharge Plan:     In-House Referral:     Discharge planning Services     Post Acute Care Choice:    Choice offered to:     DME Arranged:    DME Agency:     HH Arranged:    HH Agency:     Status of Service:  In process, will continue to follow  Medicare Important Message Given:    Date Medicare IM Given:    Medicare IM give by:    Date Additional Medicare IM Given:    Additional Medicare Important Message give by:     If discussed at [place] of Stay Meetings, dates discussed:    Additional Comments:  Pollie Friar, RN 10/02/2015, 2:06 PM

## 2015-10-02 NOTE — Progress Notes (Signed)
Pt discharged home with spouse and [facility] services. IV removed and discharge instructions given. Pt left floor via wheelchair at 1615. Hayden Rose

## 2018-03-24 ENCOUNTER — Emergency Department (HOSPITAL_COMMUNITY)
Admission: EM | Admit: 2018-03-24 | Discharge: 2018-03-25 | Disposition: A | Discharge status: Home / Self Care | Payer: Non-veteran care | Attending: Emergency Medicine | Admitting: Emergency Medicine

## 2018-03-24 ENCOUNTER — Encounter (HOSPITAL_COMMUNITY): Payer: Self-pay | Admitting: Emergency Medicine

## 2018-03-24 DIAGNOSIS — G3183 Dementia with Lewy bodies: Secondary | ICD-10-CM | POA: Diagnosis not present

## 2018-03-24 DIAGNOSIS — Z7982 Long term (current) use of aspirin: Secondary | ICD-10-CM | POA: Diagnosis not present

## 2018-03-24 DIAGNOSIS — F0391 Unspecified dementia with behavioral disturbance: Secondary | ICD-10-CM | POA: Diagnosis present

## 2018-03-24 DIAGNOSIS — N183 Chronic kidney disease, stage 3 (moderate): Secondary | ICD-10-CM | POA: Diagnosis not present

## 2018-03-24 DIAGNOSIS — Z85828 Personal history of other malignant neoplasm of skin: Secondary | ICD-10-CM | POA: Insufficient documentation

## 2018-03-24 DIAGNOSIS — Z79899 Other long term (current) drug therapy: Secondary | ICD-10-CM | POA: Insufficient documentation

## 2018-03-24 DIAGNOSIS — F332 Major depressive disorder, recurrent severe without psychotic features: Secondary | ICD-10-CM | POA: Diagnosis not present

## 2018-03-24 DIAGNOSIS — R45851 Suicidal ideations: Secondary | ICD-10-CM | POA: Diagnosis not present

## 2018-03-24 DIAGNOSIS — F918 Other conduct disorders: Secondary | ICD-10-CM | POA: Diagnosis present

## 2018-03-24 DIAGNOSIS — R7989 Other specified abnormal findings of blood chemistry: Secondary | ICD-10-CM | POA: Insufficient documentation

## 2018-03-24 DIAGNOSIS — E039 Hypothyroidism, unspecified: Secondary | ICD-10-CM | POA: Insufficient documentation

## 2018-03-24 DIAGNOSIS — F0281 Dementia in other diseases classified elsewhere with behavioral disturbance: Secondary | ICD-10-CM | POA: Diagnosis not present

## 2018-03-24 DIAGNOSIS — Z87891 Personal history of nicotine dependence: Secondary | ICD-10-CM | POA: Diagnosis not present

## 2018-03-24 DIAGNOSIS — R4689 Other symptoms and signs involving appearance and behavior: Secondary | ICD-10-CM

## 2018-03-24 DIAGNOSIS — F03918 Unspecified dementia, unspecified severity, with other behavioral disturbance: Secondary | ICD-10-CM | POA: Diagnosis present

## 2018-03-24 DIAGNOSIS — Z046 Encounter for general psychiatric examination, requested by authority: Secondary | ICD-10-CM | POA: Insufficient documentation

## 2018-03-24 LAB — COMPREHENSIVE METABOLIC PANEL WITH GFR
ALT: 20 U/L (ref 17–63)
AST: 24 U/L (ref 15–41)
Albumin: 4.6 g/dL (ref 3.5–5.0)
Alkaline Phosphatase: 39 U/L (ref 38–126)
Anion gap: 14 (ref 5–15)
BUN: 38 mg/dL — ABNORMAL HIGH (ref 6–20)
CO2: 23 mmol/L (ref 22–32)
Calcium: 9.7 mg/dL (ref 8.9–10.3)
Chloride: 105 mmol/L (ref 101–111)
Creatinine, Ser: 2.09 mg/dL — ABNORMAL HIGH (ref 0.61–1.24)
GFR calc Af Amer: 36 mL/min — ABNORMAL LOW
GFR calc non Af Amer: 31 mL/min — ABNORMAL LOW
Glucose, Bld: 93 mg/dL (ref 65–99)
Potassium: 3.2 mmol/L — ABNORMAL LOW (ref 3.5–5.1)
Sodium: 142 mmol/L (ref 135–145)
Total Bilirubin: 1.1 mg/dL (ref 0.3–1.2)
Total Protein: 7 g/dL (ref 6.5–8.1)

## 2018-03-24 LAB — CBC WITH DIFFERENTIAL/PLATELET
Basophils Absolute: 0.1 K/uL (ref 0.0–0.1)
Basophils Relative: 0 %
Eosinophils Absolute: 0 K/uL (ref 0.0–0.7)
Eosinophils Relative: 0 %
HCT: 39.2 % (ref 39.0–52.0)
Hemoglobin: 14.5 g/dL (ref 13.0–17.0)
Lymphocytes Relative: 11 %
Lymphs Abs: 1.5 K/uL (ref 0.7–4.0)
MCH: 31.3 pg (ref 26.0–34.0)
MCHC: 37 g/dL — ABNORMAL HIGH (ref 30.0–36.0)
MCV: 84.5 fL (ref 78.0–100.0)
Monocytes Absolute: 1.5 K/uL — ABNORMAL HIGH (ref 0.1–1.0)
Monocytes Relative: 11 %
Neutro Abs: 11 K/uL — ABNORMAL HIGH (ref 1.7–7.7)
Neutrophils Relative %: 78 %
Platelets: 198 K/uL (ref 150–400)
RBC: 4.64 MIL/uL (ref 4.22–5.81)
RDW: 12.7 % (ref 11.5–15.5)
WBC: 14.1 K/uL — ABNORMAL HIGH (ref 4.0–10.5)

## 2018-03-24 LAB — URINALYSIS, ROUTINE W REFLEX MICROSCOPIC
Bilirubin Urine: NEGATIVE
Glucose, UA: NEGATIVE mg/dL
Hgb urine dipstick: NEGATIVE
Ketones, ur: NEGATIVE mg/dL
Leukocytes, UA: NEGATIVE
Nitrite: NEGATIVE
Protein, ur: NEGATIVE mg/dL
Specific Gravity, Urine: 1.017 (ref 1.005–1.030)
pH: 5 (ref 5.0–8.0)

## 2018-03-24 LAB — RAPID URINE DRUG SCREEN, HOSP PERFORMED
Amphetamines: NOT DETECTED
Benzodiazepines: NOT DETECTED
Cocaine: NOT DETECTED
Opiates: POSITIVE — AB
Tetrahydrocannabinol: NOT DETECTED

## 2018-03-24 LAB — SALICYLATE LEVEL: Salicylate Lvl: <7 mg/dL (ref 2.8–30.0)

## 2018-03-24 LAB — ACETAMINOPHEN LEVEL: Acetaminophen (Tylenol), Serum: <10 ug/mL — ABNORMAL LOW (ref 10–30)

## 2018-03-24 LAB — ETHANOL: Alcohol, Ethyl (B): <10 mg/dL

## 2018-03-24 LAB — I-STAT CREATININE, ED: Creatinine, Ser: 1.6 mg/dL — ABNORMAL HIGH (ref 0.61–1.24)

## 2018-03-24 MED ORDER — POTASSIUM CHLORIDE CRYS ER 20 MEQ PO TBCR
40.0000 meq | EXTENDED_RELEASE_TABLET | Freq: Once | ORAL | Status: AC
  Administered 2018-03-24: 40 meq via ORAL
  Filled 2018-03-24: qty 2

## 2018-03-24 MED ORDER — SODIUM CHLORIDE 0.9 % IV BOLUS
1000.0000 mL | Freq: Once | INTRAVENOUS | Status: AC
  Administered 2018-03-24: 1000 mL via INTRAVENOUS

## 2018-03-24 MED ORDER — DIVALPROEX SODIUM 500 MG PO DR TAB
500.0000 mg | DELAYED_RELEASE_TABLET | Freq: Two times a day (BID) | ORAL | Status: DC
  Administered 2018-03-24 – 2018-03-25 (×2): 500 mg via ORAL
  Filled 2018-03-24 (×2): qty 1

## 2018-03-24 MED ORDER — ESCITALOPRAM OXALATE 10 MG PO TABS
20.0000 mg | ORAL_TABLET | Freq: Every day | ORAL | Status: DC
  Administered 2018-03-25: 20 mg via ORAL
  Filled 2018-03-24: qty 2

## 2018-03-24 MED ORDER — LOPERAMIDE HCL 2 MG PO CAPS: 2.0000 mg | ORAL_CAPSULE | Freq: Four times a day (QID) | ORAL | Status: DC | PRN

## 2018-03-24 MED ORDER — BUSPIRONE HCL 10 MG PO TABS
10.0000 mg | ORAL_TABLET | Freq: Two times a day (BID) | ORAL | Status: DC
  Administered 2018-03-24 – 2018-03-25 (×2): 10 mg via ORAL
  Filled 2018-03-24 (×2): qty 1

## 2018-03-24 MED ORDER — ACETAMINOPHEN 325 MG PO TABS
650.0000 mg | ORAL_TABLET | Freq: Three times a day (TID) | ORAL | Status: DC
  Administered 2018-03-24 – 2018-03-25 (×2): 650 mg via ORAL
  Filled 2018-03-24 (×2): qty 2

## 2018-03-24 MED ORDER — HYDROCODONE-ACETAMINOPHEN 10-325 MG PO TABS
1.0000 | ORAL_TABLET | Freq: Two times a day (BID) | ORAL | Status: DC
  Administered 2018-03-24 – 2018-03-25 (×2): 1 via ORAL
  Filled 2018-03-24 (×2): qty 1

## 2018-03-24 MED ORDER — ASPIRIN 81 MG PO CHEW
81.0000 mg | CHEWABLE_TABLET | Freq: Every day | ORAL | Status: DC
  Administered 2018-03-25: 81 mg via ORAL
  Filled 2018-03-24: qty 1

## 2018-03-24 MED ORDER — ROPINIROLE HCL 0.25 MG PO TABS
0.2500 mg | ORAL_TABLET | Freq: Every day | ORAL | Status: DC
  Administered 2018-03-24: 0.25 mg via ORAL
  Filled 2018-03-24: qty 1

## 2018-03-24 MED ORDER — LEVOTHYROXINE SODIUM 75 MCG PO TABS
75.0000 ug | ORAL_TABLET | Freq: Every day | ORAL | Status: DC
  Administered 2018-03-25: 75 ug via ORAL
  Filled 2018-03-24: qty 1

## 2018-03-24 MED ORDER — HYDROXYZINE HCL 25 MG PO TABS
25.0000 mg | ORAL_TABLET | Freq: Three times a day (TID) | ORAL | Status: DC
  Administered 2018-03-24 – 2018-03-25 (×2): 25 mg via ORAL
  Filled 2018-03-24 (×2): qty 1

## 2018-03-24 MED ORDER — HYDROCHLOROTHIAZIDE 25 MG PO TABS
25.0000 mg | ORAL_TABLET | Freq: Every day | ORAL | Status: DC
  Administered 2018-03-25: 25 mg via ORAL
  Filled 2018-03-24: qty 1

## 2018-03-24 NOTE — ED Notes (Signed)
Bed: WA31 Expected date:  Expected time:  Means of arrival:  Comments: 

## 2018-03-24 NOTE — BH Assessment (Signed)
[facility] Assessment Progress Note  Case was staffed with Parks FNP who recommended patient be monitored and observed for safety. Patient will be seen by psychiatry in the a.m.      

## 2018-03-24 NOTE — ED Provider Notes (Signed)
[facility] DEPT Provider Note   CSN: [id number] Arrival date & time: 03/24/18  1454     History   Chief Complaint Chief Complaint  Patient presents with  . Medical Clearance    HPI Hayden Rose is a 67 y.o. male with history of anxiety, arthritis, CKD, degenerative disc disease, Dupuytren's contracture, Lewy body dementia presents for evaluation under IVC for aggressive behavior.  Per IVC paperwork, patient's wife took out IVC paperwork on him for becoming verbally aggressive and physically agitated earlier today.  The patient tells me that he was involved in a verbal altercation with his wife but states "I just raise my voice at her I never became physical ".  He saw his neurologist this morning and apparently was agitated at that time.  IVC pubic states he has not been taking many of his medications for the past 10 days.  Patient endorses suicidal ideations with plan to hang himself, denies homicidal ideation or auditory or visual hallucinations.  Denies any medical complaints at this time aside from chronic pain.  The history is provided by the patient.    Past Medical History:  Diagnosis Date  . Anxiety   . Arthritis   . Back pain    chronic  . Chronic kidney disease    stage III  renal disease   . Degenerative disc disease   . Depression   . Dizziness   . Dupuytren's contracture of right hand   . Dyspnea   . Elevated LFTs   . Erectile dysfunction   . Hypothyroidism   . Jaundice with stoppage of bile flow    after stent romoved  . Lewy body dementia   . Memory deficits 04/23/2013  . Orthostatic hypotension   . Osgood-Schlatter's disease   . Restless leg syndrome 04/23/2013  . Skin cancer    basal cell on back x2  . Sleep apnea with use of continuous positive airway pressure (CPAP)   . Tachycardia    heart monitor last week for 48 hours no report yet    Patient Active Problem List   Diagnosis Date Noted  . Benzodiazepine  dependence ([place]) 10/02/2015  . Polypharmacy 10/02/2015  . Opioid use disorder, moderate, dependence ([place]) 10/02/2015  . Acute encephalopathy 10/01/2015  . Altered mental status 10/01/2015  . Facial droop 09/10/2015  . Renal insufficiency   . Central sleep apnea comorbid with prescribed opioid use, moderate 04/02/2014  . Memory deficits 04/23/2013  . Restless leg syndrome 04/23/2013  . Sleep apnea with use of continuous positive airway pressure (CPAP)   . Nonspecific abnormal results of liver function study 12/08/2011  . HYPOTHYROIDISM 09/19/2009  . DEGENERATIVE JOINT DISEASE 09/19/2009  . ARTHRITIS 09/19/2009  . DEGENERATIVE DISC DISEASE 09/19/2009  . NECK PAIN, CHRONIC 09/19/2009  . BACK PAIN, CHRONIC 09/19/2009  . OSGOOD SCHLATTER'S DISEASE 09/19/2009  . DIZZINESS 09/19/2009  . DYSPNEA 09/19/2009  . ANXIETY 07/18/2009  . DEPRESSION 07/18/2009  . ORTHOSTATIC HYPOTENSION 07/18/2009  . SKIN CANCER, HX OF 07/18/2009    Past Surgical History:  Procedure Laterality Date  . APPENDECTOMY    . BASAL CELL CARCINOMA EXCISION  2008,2009  . CARPAL TUNNEL RELEASE  2003  . CERVICAL FUSION  1985  . CHOLECYSTECTOMY    . GALLBLADDER SURGERY  2007-2008  . LIVER BIOPSY  12/27/2011   Procedure: LIVER BIOPSY;  Surgeon: Inda Castle, MD;  Location: [facility];  Service: Endoscopy;  Laterality: N/A;  pt moved from 3/22 to 3/18 (  aw)  . LUMBAR FUSION  1996  . TONSILLECTOMY          Home Medications    Prior to Admission medications   Medication Sig Start Date End Date Taking? Authorizing Provider  acetaminophen (TYLENOL) 325 MG tablet Take 650 mg by mouth 3 (three) times daily.   Yes [provider]  aspirin 81 MG chewable tablet Chew 1 tablet (81 mg total) by mouth daily. 09/11/15  Yes Maryellen Pile, MD  busPIRone (BUSPAR) 10 MG tablet Take 10 mg by mouth 2 (two) times daily.   Yes [provider]  divalproex (DEPAKOTE) 500 MG DR tablet Take 500 mg by mouth 2 (two)  times daily.   Yes [provider]  escitalopram (LEXAPRO) 20 MG tablet Take 20 mg by mouth daily.   Yes [provider]  hydrochlorothiazide (HYDRODIURIL) 25 MG tablet Take 25 mg by mouth daily.   Yes [provider]  HYDROcodone-acetaminophen (NORCO) 10-325 MG tablet Take 1 tablet by mouth 2 (two) times daily.   Yes [provider]  hydrOXYzine (ATARAX/VISTARIL) 25 MG tablet Take 25 mg by mouth 3 (three) times daily.   Yes [provider]  levothyroxine (SYNTHROID, LEVOTHROID) 75 MCG tablet Take 75 mcg by mouth daily before breakfast.   Yes [provider]  ALPRAZolam (XANAX) 1 MG tablet Take 1 tablet (1 mg total) by mouth 3 (three) times daily as needed for anxiety. Patient not taking: Reported on 03/24/2018 10/02/15   Maryellen Pile, MD  atorvastatin (LIPITOR) 80 MG tablet Take 1 tablet (80 mg total) by mouth daily at 6 PM. Patient not taking: Reported on 09/30/2015 09/11/15   Maryellen Pile, MD  carbamazepine (TEGRETOL) 200 MG tablet One tablet in the morning and two tablets in the evening Patient not taking: Reported on 09/10/2015 04/24/14   Kathrynn Ducking, MD  levothyroxine (SYNTHROID, LEVOTHROID) 100 MCG tablet Take 1 tablet (100 mcg total) by mouth daily before breakfast. Patient not taking: Reported on 03/24/2018 09/12/15   Maryellen Pile, MD  loperamide (IMODIUM A-D) 2 MG tablet Take 2 mg by mouth 4 (four) times daily as needed for diarrhea or loose stools.    [provider]    Family History Family History  Problem Relation Age of Onset  . Coronary artery disease Mother   . Heart attack Mother        stents in neck and heart ?  Marland Kitchen Crohn's disease Mother   . Cirrhosis Father   . Cirrhosis Sister   . Prostate cancer Maternal Uncle   . Liver cancer Cousin   . Colitis Daughter   . Diabetes Maternal Uncle   . Diabetes Brother   . Kidney disease Maternal Aunt   . Kidney disease Cousin     Social History Social  History   Tobacco Use  . Smoking status: Former Smoker    Last attempt to quit: 12/27/1975    Years since quitting: 42.2  . Smokeless tobacco: Never Used  Substance Use Topics  . Alcohol use: No  . Drug use: No     Allergies   Vesicare [solifenacin succinate]; Aricept [donepezil hydrochloride]; Lamictal [lamotrigine]; Namenda [memantine hcl]; Oxycodone hcl; Sulfur; Exelon [rivastigmine]; Fentanyl; and Penicillins   Review of Systems Review of Systems  Constitutional: Negative for chills and fever.  Psychiatric/Behavioral: Positive for agitation and suicidal ideas.  All other systems reviewed and are negative.    Physical Exam Updated Vital Signs BP (!) 156/88 (BP Location: Right Arm)  Pulse 86   Temp 98 F (36.7 C) (Oral)   Resp 20   SpO2 100%   Physical Exam  Constitutional: He is oriented to person, place, and time. He appears well-developed and well-nourished. No distress.  HENT:  Head: Normocephalic and atraumatic.  Eyes: Pupils are equal, round, and reactive to light. Conjunctivae and EOM are normal. Right eye exhibits no discharge. Left eye exhibits no discharge.  Pupils somewhat dilated around 6 mm bilaterally  Neck: No JVD present. No tracheal deviation present.  Cardiovascular: Normal rate, regular rhythm and normal heart sounds.  Pulmonary/Chest: Effort normal and breath sounds normal.  Abdominal: Soft. Bowel sounds are normal. He exhibits no distension. There is no tenderness. There is no guarding.  Musculoskeletal: He exhibits no edema.  Neurological: He is alert and oriented to person, place, and time. No cranial nerve deficit or sensory deficit. He exhibits normal muscle tone.  Moves extremities spontaneously.  Fluent speech with no evidence of dysarthria or aphasia, no facial droop, sensation intact to soft touch of extremities.  Cranial nerves II through XII tested and intact.  Bilateral upper extremities are tremulous at rest, patient states this is  chronic and unchanged.  Skin: Skin is warm and dry. No erythema.  Psychiatric: His speech is normal. His affect is angry. He exhibits a depressed mood. He expresses suicidal ideation. He expresses no homicidal ideation. He expresses suicidal plans. He expresses no homicidal plans.  Does not appear to be responding to internal stimuli at this time.  Nursing note and vitals reviewed.    ED Treatments / Results  Labs (all labs ordered are listed, but only abnormal results are displayed) Labs Reviewed  COMPREHENSIVE METABOLIC PANEL - Abnormal; Notable for the following components:      Result Value   Potassium 3.2 (*)    BUN 38 (*)    Creatinine, Ser 2.09 (*)    GFR calc non Af Amer 31 (*)    GFR calc Af Amer 36 (*)    All other components within normal limits  RAPID URINE DRUG SCREEN, HOSP PERFORMED - Abnormal; Notable for the following components:   Opiates POSITIVE (*)    Barbiturates   (*)    Value: Result not available. Reagent lot number recalled by manufacturer.   All other components within normal limits  CBC WITH DIFFERENTIAL/PLATELET - Abnormal; Notable for the following components:   WBC 14.1 (*)    MCHC 37.0 (*)    Neutro Abs 11.0 (*)    Monocytes Absolute 1.5 (*)    All other components within normal limits  URINALYSIS, ROUTINE W REFLEX MICROSCOPIC - Abnormal; Notable for the following components:   APPearance HAZY (*)    All other components within normal limits  ACETAMINOPHEN LEVEL - Abnormal; Notable for the following components:   Acetaminophen (Tylenol), Serum <10 (*)    All other components within normal limits  I-STAT CREATININE, ED - Abnormal; Notable for the following components:   Creatinine, Ser 1.60 (*)    All other components within normal limits  ETHANOL  SALICYLATE LEVEL    EKG None  Radiology No results found.  Procedures Procedures (including critical care time)  Medications Ordered in ED Medications  rOPINIRole (REQUIP) tablet 0.25 mg  (0.25 mg Oral Given 03/24/18 2109)  acetaminophen (TYLENOL) tablet 650 mg (650 mg Oral Given 03/24/18 2316)  aspirin chewable tablet 81 mg (has no administration in time range)  busPIRone (BUSPAR) tablet 10 mg (10 mg Oral Given 03/24/18 2316)  divalproex (DEPAKOTE) DR tablet 500 mg (500 mg Oral Given 03/24/18 2316)  escitalopram (LEXAPRO) tablet 20 mg (has no administration in time range)  hydrochlorothiazide (HYDRODIURIL) tablet 25 mg (has no administration in time range)  HYDROcodone-acetaminophen (NORCO) 10-325 MG per tablet 1 tablet (1 tablet Oral Given 03/24/18 2316)  hydrOXYzine (ATARAX/VISTARIL) tablet 25 mg (25 mg Oral Given 03/24/18 2316)  levothyroxine (SYNTHROID, LEVOTHROID) tablet 75 mcg (has no administration in time range)  loperamide (IMODIUM) capsule 2 mg (has no administration in time range)  ALPRAZolam (XANAX) tablet 1 mg (1 mg Oral Given 03/25/18 0054)  potassium chloride SA (K-DUR,KLOR-CON) CR tablet 40 mEq (40 mEq Oral Given 03/24/18 1728)  sodium chloride 0.9 % bolus 1,000 mL (0 mLs Intravenous Stopped 03/24/18 2044)  potassium chloride SA (K-DUR,KLOR-CON) CR tablet 40 mEq (40 mEq Oral Given 03/24/18 2014)     Initial Impression / Assessment and Plan / ED Course  I have reviewed the triage vital signs and the nursing notes.  Pertinent labs & imaging results that were available during my care of the patient were reviewed by me and considered in my medical decision making (see chart for details).     Patient presents for evaluation under IVC for aggressive behavior.  He apparently has not been taking some of his home medicines because he does not like the side effects.  He is afebrile, mildly hypertensive in the ED but nontoxic in appearance.  Lab work reviewed by me shows an elevated creatinine which improved significantly with IV fluids.  Suspect patient is somewhat dehydrated.  He is medically clear for TTS evaluation at this time.   TTS recommends overnight observation and  a.m. psychiatric evaluation.  Final Clinical Impressions(s) / ED Diagnoses   Final diagnoses:  Suicidal ideation  Aggressive behavior  Elevated serum creatinine    ED Discharge Orders    None       Debroah Baller 03/25/18 0128    Lajean Saver, MD 03/25/18 [phone]

## 2018-03-24 NOTE — ED Triage Notes (Signed)
Per IVC paper work-states patient has became verbally and physically aggressive towards wife-not taking meds-saw neurologist this am and he advised wife to take out IVC

## 2018-03-24 NOTE — BH Assessment (Addendum)
Assessment Note  Hayden Rose is an 67 y.o. male that presents this date with IVC. Per IVC: "Respondent is a danger to self and others. Respondent has a history of vascular dementia and other psychiatric issues and stopped taking his medications over 10 days ago and has become physically and verbally aggressive towards his wife".   Patient displays active thought blocking as this Probation officer attempts to conduct assessment. Patient is oriented to time/place and renders his history with prompting. Patient when redirected can answer questions associated with assessment but renders conflicting history at times.  Patient has a history of anxiety, Lewy body dementia and depression. Patient states he was diagnosed with depression over a year ago by the [place] (patient is a veteran serving in [employer]) and reports he discontinued his medications over 10 days ago when he feels the medications were no longer working. Patient reports he receives OP services from the [place] who assists with medication management. Patient is observed to have hand tremors and speaks in a tremulous voice. Patient reports he has some "memory problems" but denies any dementia diagnoses. Patient does admit to having mental health issues associated with depression. Patient reports he discontinued his "sad medications" and it is unclear what medications were discontinued. Patient denies content of IVC stating "he just raised his voice at his wife because she is trying to take control of his life." Patient denies any previous attempts/gestures at self harm, H/I or AVH. Patient denies any history of SA use. Patient did test positive for opiates this date but reports he takes medications for pain management. Patient reports some passive thoughts of self harm this date due to chronic pain management issues but denies any plan or intent. Per IVC paperwork, patient's wife took out IVC paperwork on him for becoming verbally aggressive and physically agitated  earlier today. The patient informs staff that he was involved in a verbal altercation with his wife but states "I just raise my voice at her I never became physical ". He saw his neurologist this morning and apparently was agitated at that time. Patient endorses suicidal ideations with plan to hang himself, denies homicidal ideation or auditory or visual hallucinations. Case was staffed with Romilda Garret FNP who recommended patient be monitored and observed for safety. Patient will be seen by psychiatry in the am.      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 APPENDECTOMY    . BASAL CELL CARCINOMA EXCISION  2008,2009  . CARPAL TUNNEL RELEASE  2003  . CERVICAL FUSION  1985  . CHOLECYSTECTOMY    . GALLBLADDER SURGERY  2007-2008  . LIVER BIOPSY  12/27/2011   Procedure: LIVER BIOPSY;  Surgeon: Inda Castle, MD;  Location: [facility];  Service: Endoscopy;  Laterality: N/A;  pt moved from 3/22 to 3/18 ( aw)  . LUMBAR FUSION  1996  . TONSILLECTOMY      Family History:  Family History  Problem Relation Age of Onset  . Coronary artery disease Mother   . Heart attack Mother        stents in neck and heart ?  Marland Kitchen Crohn's disease Mother   . Cirrhosis Father   . Cirrhosis Sister   . Prostate cancer Maternal Uncle   . Liver cancer Cousin   . Colitis Daughter   . Diabetes Maternal Uncle   . Diabetes Brother   .  Kidney disease Maternal Aunt   . Kidney disease Cousin     Social History:  reports that he quit smoking about 42 years ago. He has never used smokeless tobacco. He reports that he does not drink alcohol or use drugs.  Additional Social History:  Alcohol / Drug Use Pain Medications: See MAR Prescriptions: See MAR Over the Counter: See MAR History of alcohol / drug use?: No history of alcohol / drug abuse Longest period of sobriety (when/how long):  NA Negative Consequences of Use: (Denies) Withdrawal Symptoms: (Denies)  CIWA: CIWA-Ar BP: (!) 156/88 Pulse Rate: 86 COWS:      Home Medications:  (Not in a hospital admission)  OB/GYN Status:  No LMP for male patient.  General Assessment Data Location of Assessment: [facility] TTS Assessment: In system Is this a Tele or Face-to-Face Assessment?: Face-to-Face Is this an Initial Assessment or a Re-assessment for this encounter?: Initial Assessment Marital status: Married Reserve name: NA Is patient pregnant?: No Pregnancy Status: No Living Arrangements: Spouse/significant other Can pt return to current living arrangement?: Yes Admission Status: Involuntary Is patient capable of signing voluntary admission?: Yes Referral Source: Self/Family/Friend Insurance type: VA  Medical Screening Exam ([place]) Medical Exam completed: Yes  Crisis Care Plan Living Arrangements: Spouse/significant other Legal Guardian: (NA) Name of Psychiatrist: [place]  Name of Therapist: None  Education Status Is patient currently in school?: No Is the patient employed, unemployed or receiving disability?: Unemployed  Risk to self with the past 6 months Suicidal Ideation: Yes-Currently Present Has patient been a risk to self within the past 6 months prior to admission? : No Suicidal Intent: No Has patient had any suicidal intent within the past 6 months prior to admission? : No Is patient at risk for suicide?: Yes Suicidal Plan?: No Has patient had any suicidal plan within the past 6 months prior to admission? : No Access to Means: No What has been your use of drugs/alcohol within the last 12 months?: Denies Previous Attempts/Gestures: No How many times?: 0 Other Self Harm Risks: NA Triggers for Past Attempts: Unknown Intentional Self Injurious Behavior: None Family Suicide History: No Recent stressful life event(s): Other (Comment)(Increased depression) Persecutory  voices/beliefs?: No Depression: Yes Depression Symptoms: Feeling worthless/self pity Substance abuse history and/or treatment for substance abuse?: No Suicide prevention information given to non-admitted patients: Not applicable  Risk to Others within the past 6 months Homicidal Ideation: No Does patient have any lifetime risk of violence toward others beyond the six months prior to admission? : No Thoughts of Harm to Others: No Current Homicidal Intent: No Current Homicidal Plan: No Access to Homicidal Means: No Identified Victim: NA History of harm to others?: No Assessment of Violence: None Noted Violent Behavior Description: NA Does patient have access to weapons?: No Criminal Charges Pending?: No Does patient have a court date: No Is patient on probation?: No  Psychosis Hallucinations: None noted Delusions: None noted  Mental Status Report Appearance/Hygiene: In scrubs Eye Contact: Good Motor Activity: Freedom of movement Speech: Slow Level of Consciousness: Quiet/awake Mood: Anxious Affect: Appropriate to circumstance Anxiety Level: Moderate Thought Processes: Thought Blocking Judgement: Unimpaired Orientation: Person, Place, Time Obsessive Compulsive Thoughts/Behaviors: None  Cognitive Functioning Concentration: Decreased Memory: Recent Intact, Remote Intact Is patient IDD: No Is patient DD?: No Insight: Fair Impulse Control: Fair Appetite: Good Have you had any weight changes? : No Change Sleep: No Change Total Hours of Sleep: 6 Vegetative Symptoms: None  ADLScreening [facility] Assessment Services) Patient's cognitive  ability adequate to safely complete daily activities?: No Patient able to express need for assistance with ADLs?: Yes Independently performs ADLs?: Yes (appropriate for developmental age)  Prior Inpatient Therapy Prior Inpatient Therapy: No  Prior Outpatient Therapy Prior Outpatient Therapy: Yes Prior Therapy Dates: Ongoing Prior  Therapy Facilty/Provider(s): [facility] Reason for Treatment: Med mang Does patient have an ACCT team?: No Does patient have Intensive In-House Services?  : No Does patient have [facility] services? : No Does patient have P4CC services?: No  ADL Screening (condition at time of admission) Patient's cognitive ability adequate to safely complete daily activities?: No Is the patient deaf or have difficulty hearing?: No Does the patient have difficulty seeing, even when wearing glasses/contacts?: No Does the patient have difficulty concentrating, remembering, or making decisions?: Yes Patient able to express need for assistance with ADLs?: Yes Does the patient have difficulty dressing or bathing?: No Independently performs ADLs?: Yes (appropriate for developmental age) Does the patient have difficulty walking or climbing stairs?: No Weakness of Legs: None Weakness of Arms/Hands: None  Home Assistive Devices/Equipment Home Assistive Devices/Equipment: None  Therapy Consults (therapy consults require a physician order) PT Evaluation Needed: No OT Evalulation Needed: No SLP Evaluation Needed: No Abuse/Neglect Assessment (Assessment to be complete while patient is alone) Physical Abuse: Denies Verbal Abuse: Denies Sexual Abuse: Denies Exploitation of patient/patient's resources: Denies Self-Neglect: Denies Values / Beliefs Cultural Requests During Hospitalization: None Spiritual Requests During Hospitalization: None Consults Spiritual Care Consult Needed: No Social Work Consult Needed: No Regulatory affairs officer (For Healthcare) Does Patient Have a Medical Advance Directive?: No Would patient like information on creating a medical advance directive?: No - Patient declined    Additional Information 1:1 In Past 12 Months?: No CIRT Risk: No Elopement Risk: No Does patient have medical clearance?: Yes     Disposition: Case was staffed with Romilda Garret FNP who recommended patient be  monitored and observed for safety. Patient will be seen by psychiatry in the am.     Disposition Initial Assessment Completed for this Encounter: Yes Disposition of Patient: (Observe and monitor) Patient refused recommended treatment: No Mode of transportation if patient is discharged?: [facility])  On Site Evaluation by:   Reviewed with Physician:    Mamie Nick 03/24/2018 5:08 PM

## 2018-03-25 DIAGNOSIS — Z87891 Personal history of nicotine dependence: Secondary | ICD-10-CM | POA: Diagnosis not present

## 2018-03-25 DIAGNOSIS — F03918 Unspecified dementia, unspecified severity, with other behavioral disturbance: Secondary | ICD-10-CM | POA: Diagnosis present

## 2018-03-25 DIAGNOSIS — Z8659 Personal history of other mental and behavioral disorders: Secondary | ICD-10-CM

## 2018-03-25 DIAGNOSIS — G8929 Other chronic pain: Secondary | ICD-10-CM

## 2018-03-25 DIAGNOSIS — F0391 Unspecified dementia with behavioral disturbance: Secondary | ICD-10-CM

## 2018-03-25 MED ORDER — RISPERIDONE 0.5 MG PO TABS
0.5000 mg | ORAL_TABLET | Freq: Two times a day (BID) | ORAL | Status: DC
  Administered 2018-03-25: 0.5 mg via ORAL
  Filled 2018-03-25: qty 1

## 2018-03-25 MED ORDER — ALPRAZOLAM 1 MG PO TABS
1.0000 mg | ORAL_TABLET | Freq: Three times a day (TID) | ORAL | Status: DC | PRN
  Administered 2018-03-25: 1 mg via ORAL
  Filled 2018-03-25: qty 1

## 2018-03-25 MED ORDER — RISPERIDONE 0.5 MG PO TABS: 0.5000 mg | ORAL_TABLET | Freq: Two times a day (BID) | ORAL | 0 refills | Status: DC

## 2018-03-25 MED ORDER — ROPINIROLE HCL 0.25 MG PO TABS: 0.2500 mg | ORAL_TABLET | Freq: Every day | ORAL | 0 refills | Status: DC

## 2018-03-25 NOTE — Consult Note (Addendum)
[facility] Consult   Reason for Consult:  Agitation  Referring Physician:  EDP Patient Identification: Hayden Rose MRN:  [id number] Principal Diagnosis: Dementia with behavioral disturbance Diagnosis:   Patient Active Problem List   Diagnosis Date Noted  . Dementia with behavioral disturbance [F03.91] 03/25/2018    Priority: High  . Benzodiazepine dependence ([place]) [F13.20] 10/02/2015  . Polypharmacy [Z79.899] 10/02/2015  . Opioid use disorder, moderate, dependence ([place]) [F11.20] 10/02/2015  . Acute encephalopathy [G93.40] 10/01/2015  . Altered mental status [R41.82] 10/01/2015  . Facial droop [R29.810] 09/10/2015  . Renal insufficiency [N28.9]   . Central sleep apnea comorbid with prescribed opioid use, moderate [F11.90, G47.37] 04/02/2014  . Memory deficits [R41.3] 04/23/2013  . Restless leg syndrome [G25.81] 04/23/2013  . Sleep apnea with use of continuous positive airway pressure (CPAP) [G47.30]   . Nonspecific abnormal results of liver function study [R94.5] 12/08/2011  . HYPOTHYROIDISM [E03.9] 09/19/2009  . DEGENERATIVE JOINT DISEASE [M19.90] 09/19/2009  . ARTHRITIS [M12.9] 09/19/2009  . DEGENERATIVE DISC DISEASE [IMO0002] 09/19/2009  . NECK PAIN, CHRONIC [M54.2] 09/19/2009  . BACK PAIN, CHRONIC [M54.9] 09/19/2009  . OSGOOD SCHLATTER'S DISEASE [M92.8] 09/19/2009  . DIZZINESS [R42] 09/19/2009  . DYSPNEA [R06.02] 09/19/2009  . ANXIETY [F41.1] 07/18/2009  . DEPRESSION [F32.9] 07/18/2009  . ORTHOSTATIC HYPOTENSION [I95.1] 07/18/2009  . SKIN CANCER, HX OF [Z85.828] 07/18/2009    Total Time spent with patient: 45 minutes  Subjective:   Hayden Rose is a 67 y.o. male patient does not warrant admission.  HPI:  Hayden Rose is a 67 year old white male who presented to the ED on 03/24/2018 via IVC.  Per IVC "respondent is a danger to self and others".  Respondent has a history or vascular dementia and depression but stopped taking his about 10 days ago because  he believed the medication caused diarrhea. On assessment today, patient alert and oriented x 4, stated he feels sad s/t chronic pain. Admits Hx of back surgery and continuing back pain but stated neurologist acknowledged he is not a candidate for additional surgery of his back. Stated he feels he has not been able to get help in managing pain but he manages this as best as he can. He perseverated on loss of his driver's license and doesn't understand why he cannot drive. Admitted he has been diagnosed with Vascular Dementia but doesn't feel this has affected his ability to perform ADLs or quality of his life. When questioned about suicidal or homicidal ideation or thoughts he denied this and discussed his faith in God as well as his children and grandchildren were protective factors. Stated he wants to be around to take care of his grandchildren and also discussed his love for his wife. Denies hallucinations and current substance abuse. Collateral information received from his spouse Hayden Rose via telephone call. Wife stated patient with Hx of inpatient psychiatric hospitalization for 2 weeks in December 2018 for suicidal ideation at [facility]. He was started on medications which seemed to be somewhat helpful until about 2-3 weeks ago when she noticed his mood became very irritable, staying in bed all the time, not taking showers and only eating breakfast. She found out he had stopped his Lexapro, Depakote and hydroxyzine about 2 weeks ago because he believed it was causing diarrhea. Wife stated diarrhea is chronic and intermittent for patient. She admitted calling law enforcement officers to their home after he became verbally aggressive with her. Wants patient to be placed back on his medications and  she will make a f/u at the [place] psychiatrist asap.  Past Psychiatric History: dementia, depression  Risk to Self: None Risk to Others: Homicidal Ideation: No Thoughts of Harm to Others: No Current  Homicidal Intent: No Current Homicidal Plan: No Access to Homicidal Means: No Identified Victim: NA History of harm to others?: No Assessment of Violence: None Noted Violent Behavior Description: NA Does patient have access to weapons?: No Criminal Charges Pending?: No Does patient have a court date: No Prior Inpatient Therapy: Prior Inpatient Therapy: No Prior Outpatient Therapy: Prior Outpatient Therapy: Yes Prior Therapy Dates: Ongoing Prior Therapy Facilty/Provider(s): [facility] Reason for Treatment: Med mang Does patient have an ACCT team?: No Does patient have Intensive In-House Services?  : No Does patient have [facility] services? : No Does patient have P4CC services?: No  Past Medical History:  Past Medical History:  Diagnosis Date  . Anxiety   . Arthritis   . Back pain    chronic  . Chronic kidney disease    stage III  renal disease   . Degenerative disc disease   . Depression   . Dizziness   . Dupuytren's contracture of right hand   . Dyspnea   . Elevated LFTs   . Erectile dysfunction   . Hypothyroidism   . Jaundice with stoppage of bile flow    after stent romoved  . Lewy body dementia   . Memory deficits 04/23/2013  . Orthostatic hypotension   . Osgood-Schlatter's disease   . Restless leg syndrome 04/23/2013  . Skin cancer    basal cell on back x2  . Sleep apnea with use of continuous positive airway pressure (CPAP)   . Tachycardia    heart monitor last week for 48 hours no report yet    Past Surgical History:  Procedure Laterality Date  . APPENDECTOMY    . BASAL CELL CARCINOMA EXCISION  2008,2009  . CARPAL TUNNEL RELEASE  2003  . CERVICAL FUSION  1985  . CHOLECYSTECTOMY    . GALLBLADDER SURGERY  2007-2008  . LIVER BIOPSY  12/27/2011   Procedure: LIVER BIOPSY;  Surgeon: Inda Castle, MD;  Location: [facility];  Service: Endoscopy;  Laterality: N/A;  pt moved from 3/22 to 3/18 ( aw)  . LUMBAR FUSION  1996  . TONSILLECTOMY     Family  History:  Family History  Problem Relation Age of Onset  . Coronary artery disease Mother   . Heart attack Mother        stents in neck and heart ?  Marland Kitchen Crohn's disease Mother   . Cirrhosis Father   . Cirrhosis Sister   . Prostate cancer Maternal Uncle   . Liver cancer Cousin   . Colitis Daughter   . Diabetes Maternal Uncle   . Diabetes Brother   . Kidney disease Maternal Aunt   . Kidney disease Cousin    Family Psychiatric  History: none Social History:  Social History   Substance and Sexual Activity  Alcohol Use No     Social History   Substance and Sexual Activity  Drug Use No    Social History   Socioeconomic History  . Marital status: Married    Spouse name: Hayden Rose  . Number of children: 2  . Years of education: 9th  . Highest education level: Not on file  Occupational History  . Occupation: disabled  Social Needs  . Financial resource strain: Not on file  . Food insecurity:    Worry: Not  on file    Inability: Not on file  . Transportation needs:    Medical: Not on file    Non-medical: Not on file  Tobacco Use  . Smoking status: Former Smoker    Last attempt to quit: 12/27/1975    Years since quitting: 42.2  . Smokeless tobacco: Never Used  Substance and Sexual Activity  . Alcohol use: No  . Drug use: No  . Sexual activity: Yes    Birth control/protection: Condom  Lifestyle  . Physical activity:    Days per week: Not on file    Minutes per session: Not on file  . Stress: Not on file  Relationships  . Social connections:    Talks on phone: Not on file    Gets together: Not on file    Attends religious service: Not on file    Active member of club or organization: Not on file    Attends meetings of clubs or organizations: Not on file    Relationship status: Not on file  Other Topics Concern  . Not on file  Social History Narrative   Patient is married Hayden Rose) and lives at home with his wife and his mother.   Patient is disabled.    Patient has a 9th grade education.   Patient is right-handed.   Patient does not drink any caffeine.   Patient has two adult childre   Does not regularly exercise.    Additional Social History:    Labs:  Results for orders placed or performed during the hospital encounter of 03/24/18 (from the past 48 hour(s))  Urine rapid drug screen (hosp performed)     Status: Abnormal   Collection Time: 03/24/18  3:13 PM  Result Value Ref Range   Opiates POSITIVE (A) NONE DETECTED   Cocaine NONE DETECTED NONE DETECTED   Benzodiazepines NONE DETECTED NONE DETECTED   Amphetamines NONE DETECTED NONE DETECTED   Tetrahydrocannabinol NONE DETECTED NONE DETECTED   Barbiturates (A) NONE DETECTED    Result not available. Reagent lot number recalled by manufacturer.    Comment: Performed at [facility], [place] [address] [place], [place] [address]  Urinalysis, Routine w reflex microscopic     Status: Abnormal   Collection Time: 03/24/18  3:13 PM  Result Value Ref Range   Color, Urine YELLOW YELLOW   APPearance HAZY (A) CLEAR   Specific Gravity, Urine 1.017 1.005 - 1.030   pH 5.0 5.0 - 8.0   Glucose, UA NEGATIVE NEGATIVE mg/dL   Hgb urine dipstick NEGATIVE NEGATIVE   Bilirubin Urine NEGATIVE NEGATIVE   Ketones, ur NEGATIVE NEGATIVE mg/dL   Protein, ur NEGATIVE NEGATIVE mg/dL   Nitrite NEGATIVE NEGATIVE   Leukocytes, UA NEGATIVE NEGATIVE    Comment: Performed at [facility] [address] [place], [place] [address]  Comprehensive metabolic panel     Status: Abnormal   Collection Time: 03/24/18  3:36 PM  Result Value Ref Range   Sodium 142 135 - 145 mmol/L   Potassium 3.2 (L) 3.5 - 5.1 mmol/L   Chloride 105 101 - 111 mmol/L   CO2 23 22 - 32 mmol/L   Glucose, Bld 93 65 - 99 mg/dL   BUN 38 (H) 6 - 20 mg/dL   Creatinine, Ser 2.09 (H) 0.61 - 1.24 mg/dL   Calcium 9.7 8.9 - 10.3 mg/dL   Total Protein 7.0 6.5 - 8.1 g/dL   Albumin 4.6 3.5 - 5.0 g/dL   AST 24 15  -  41 U/L   ALT 20 17 - 63 U/L   Alkaline Phosphatase 39 38 - 126 U/L   Total Bilirubin 1.1 0.3 - 1.2 mg/dL   GFR calc non Af Amer 31 (L) >60 mL/min   GFR calc Af Amer 36 (L) >60 mL/min    Comment: (NOTE) The eGFR has been calculated using the CKD EPI equation. This calculation has not been validated in all clinical situations. eGFR's persistently <60 mL/min signify possible Chronic Kidney Disease.    Anion gap 14 5 - 15    Comment: Performed at [facility], [place] [address], [place], [place] [address]  Ethanol     Status: None   Collection Time: 03/24/18  3:36 PM  Result Value Ref Range   Alcohol, Ethyl (B) <10 <10 mg/dL    Comment: (NOTE) Lowest detectable limit for serum alcohol is 10 mg/dL. For medical purposes only. Performed at [facility], [place] [address] [place], [place] [address]   CBC with Diff     Status: Abnormal   Collection Time: 03/24/18  3:36 PM  Result Value Ref Range   WBC 14.1 (H) 4.0 - 10.5 K/uL   RBC 4.64 4.22 - 5.81 MIL/uL   Hemoglobin 14.5 13.0 - 17.0 g/dL   HCT 39.2 39.0 - 52.0 %   MCV 84.5 78.0 - 100.0 fL   MCH 31.3 26.0 - 34.0 pg   MCHC 37.0 (H) 30.0 - 36.0 g/dL   RDW 12.7 11.5 - 15.5 %   Platelets 198 150 - 400 K/uL   Neutrophils Relative % 78 %   Neutro Abs 11.0 (H) 1.7 - 7.7 K/uL   Lymphocytes Relative 11 %   Lymphs Abs 1.5 0.7 - 4.0 K/uL   Monocytes Relative 11 %   Monocytes Absolute 1.5 (H) 0.1 - 1.0 K/uL   Eosinophils Relative 0 %   Eosinophils Absolute 0.0 0.0 - 0.7 K/uL   Basophils Relative 0 %   Basophils Absolute 0.1 0.0 - 0.1 K/uL    Comment: Performed at [facility], [place] [address], [place], [place] [address]  Acetaminophen level     Status: Abnormal   Collection Time: 03/24/18  3:36 PM  Result Value Ref Range   Acetaminophen (Tylenol), Serum <10 (L) 10 - 30 ug/mL    Comment: (NOTE) Therapeutic concentrations vary significantly. A range of 10-30 ug/mL  may be an  effective concentration for many patients. However, some  are best treated at concentrations outside of this range. Acetaminophen concentrations >150 ug/mL at 4 hours after ingestion  and >50 ug/mL at 12 hours after ingestion are often associated with  toxic reactions. Performed at [facility], [place] [address] [place], [place] [address]   Salicylate level     Status: None   Collection Time: 03/24/18  3:36 PM  Result Value Ref Range   Salicylate Lvl <7.1 2.8 - 30.0 mg/dL    Comment: Performed at [facility], [place] [address] [place], [place] [address]  I-stat Creatinine, ED     Status: Abnormal   Collection Time: 03/24/18 11:15 PM  Result Value Ref Range   Creatinine, Ser 1.60 (H) 0.61 - 1.24 mg/dL    Current Facility-Administered Medications  Medication Dose Route Frequency Provider Last Rate Last Dose  . acetaminophen (TYLENOL) tablet 650 mg  650 mg Oral TID Rodell Perna A, PA-C   650 mg at 03/25/18 0932  . aspirin chewable tablet 81 mg  81 mg Oral  Daily Rodell Perna A, PA-C   81 mg at 03/25/18 0932  . busPIRone (BUSPAR) tablet 10 mg  10 mg Oral BID Fawze, Mina A, PA-C   10 mg at 03/25/18 0932  . divalproex (DEPAKOTE) DR tablet 500 mg  500 mg Oral BID Nils Flack, Mina A, PA-C   500 mg at 03/25/18 0931  . escitalopram (LEXAPRO) tablet 20 mg  20 mg Oral Daily Fawze, Mina A, PA-C   20 mg at 03/25/18 0930  . hydrochlorothiazide (HYDRODIURIL) tablet 25 mg  25 mg Oral Daily Fawze, Mina A, PA-C   25 mg at 03/25/18 0933  . HYDROcodone-acetaminophen (NORCO) 10-325 MG per tablet 1 tablet  1 tablet Oral BID Fawze, Mina A, PA-C   1 tablet at 03/25/18 0931  . hydrOXYzine (ATARAX/VISTARIL) tablet 25 mg  25 mg Oral TID Rodell Perna A, PA-C   25 mg at 03/25/18 0933  . levothyroxine (SYNTHROID, LEVOTHROID) tablet 75 mcg  75 mcg Oral QAC breakfast Rodell Perna A, PA-C   75 mcg at 03/25/18 0931  . loperamide (IMODIUM) capsule 2 mg  2 mg Oral QID PRN Fawze, Mina A, PA-C       . risperiDONE (RISPERDAL) tablet 0.5 mg  0.5 mg Oral BID Patrecia Pour, NP      . rOPINIRole (REQUIP) tablet 0.25 mg  0.25 mg Oral QHS Fawze, Mina A, PA-C   0.25 mg at 03/24/18 2109   Current Outpatient Medications  Medication Sig Dispense Refill  . acetaminophen (TYLENOL) 325 MG tablet Take 650 mg by mouth 3 (three) times daily.    Marland Kitchen aspirin 81 MG chewable tablet Chew 1 tablet (81 mg total) by mouth daily. 30 tablet 0  . busPIRone (BUSPAR) 10 MG tablet Take 10 mg by mouth 2 (two) times daily.    . divalproex (DEPAKOTE) 500 MG DR tablet Take 500 mg by mouth 2 (two) times daily.    Marland Kitchen escitalopram (LEXAPRO) 20 MG tablet Take 20 mg by mouth daily.    . hydrochlorothiazide (HYDRODIURIL) 25 MG tablet Take 25 mg by mouth daily.    Marland Kitchen HYDROcodone-acetaminophen (NORCO) 10-325 MG tablet Take 1 tablet by mouth 2 (two) times daily.    . hydrOXYzine (ATARAX/VISTARIL) 25 MG tablet Take 25 mg by mouth 3 (three) times daily.    Marland Kitchen levothyroxine (SYNTHROID, LEVOTHROID) 75 MCG tablet Take 75 mcg by mouth daily before breakfast.    . ALPRAZolam (XANAX) 1 MG tablet Take 1 tablet (1 mg total) by mouth 3 (three) times daily as needed for anxiety. (Patient not taking: Reported on 03/24/2018) 30 tablet 0  . atorvastatin (LIPITOR) 80 MG tablet Take 1 tablet (80 mg total) by mouth daily at 6 PM. (Patient not taking: Reported on 09/30/2015) 30 tablet 0  . carbamazepine (TEGRETOL) 200 MG tablet One tablet in the morning and two tablets in the evening (Patient not taking: Reported on 09/10/2015) 90 tablet 0  . levothyroxine (SYNTHROID, LEVOTHROID) 100 MCG tablet Take 1 tablet (100 mcg total) by mouth daily before breakfast. (Patient not taking: Reported on 03/24/2018) 30 tablet 1  . loperamide (IMODIUM A-D) 2 MG tablet Take 2 mg by mouth 4 (four) times daily as needed for diarrhea or loose stools.      Musculoskeletal: Strength & Muscle Tone: within normal limits Gait & Station: normal Patient leans: N/A  Psychiatric  Specialty Exam: Physical Exam  Nursing note and vitals reviewed. Constitutional: He is oriented to person, place, and time. He appears well-developed and well-nourished.  HENT:  Head: Normocephalic.  Neck: Normal range of motion.  Respiratory: Effort normal.  Musculoskeletal: Normal range of motion.  Neurological: He is alert and oriented to person, place, and time.  Psychiatric: He has a normal mood and affect. His speech is normal and behavior is normal. Judgment and thought content normal. Cognition and memory are impaired.    Review of Systems  All other systems reviewed and are negative.   Blood pressure (!) 141/72, pulse 73, temperature 97.7 F (36.5 C), temperature source Oral, resp. rate 20, SpO2 100 %.There is no height or weight on file to calculate BMI.  General Appearance: Well Groomed  Eye Contact:  Good  Speech:  Clear and Coherent  Volume:  Normal  Mood:  Euthymic  Affect:  Congruent  Thought Process:  Goal Directed  Orientation:  Full (Time, Place, and Person)  Thought Content:  Logical  Suicidal Thoughts:  No  Homicidal Thoughts:  No  Memory:  Immediate;   Good Recent;   Good Remote;   Good  Judgement:  Fair  Insight:  Fair  Psychomotor Activity:  Normal  Concentration:  Concentration: Good and Attention Span: Good  Recall:  Good  Fund of Knowledge:  Fair  Language:  Good  Akathisia:  No  Handed:  Right  AIMS (if indicated):     Assets:  Housing Social Support  ADL's:  Intact  Cognition:  WNL  Sleep:        Treatment Plan Summary: Dementia with behavioral disturbance: Started Risperdal 0.5 mg BID for agitation  Disposition: No evidence of imminent risk to self or others at present.    Waylan Boga, NP 03/25/2018 12:25 PM   Patient has been evaluated by this MD,  note has been reviewed and I personally elaborated treatment  plan and recommendations.  Ambrose Finland, MD 03/26/2018

## 2018-03-25 NOTE — ED Notes (Signed)
Discharge instructions and prescriptions reviewed with patient and daughter no questions at this time patient discharged to home.

## 2018-03-25 NOTE — [facility] Suicide Risk Assessment (Signed)
Suicide Risk Assessment  Discharge Assessment   [facility] Discharge Suicide Risk Assessment   Principal Problem: Dementia with behavioral disturbance Discharge Diagnoses:  Patient Active Problem List   Diagnosis Date Noted  . Dementia with behavioral disturbance [F03.91] 03/25/2018    Priority: High  . Benzodiazepine dependence ([place]) [F13.20] 10/02/2015  . Polypharmacy [Z79.899] 10/02/2015  . Opioid use disorder, moderate, dependence ([place]) [F11.20] 10/02/2015  . Acute encephalopathy [G93.40] 10/01/2015  . Altered mental status [R41.82] 10/01/2015  . Facial droop [R29.810] 09/10/2015  . Renal insufficiency [N28.9]   . Central sleep apnea comorbid with prescribed opioid use, moderate [F11.90, G47.37] 04/02/2014  . Memory deficits [R41.3] 04/23/2013  . Restless leg syndrome [G25.81] 04/23/2013  . Sleep apnea with use of continuous positive airway pressure (CPAP) [G47.30]   . Nonspecific abnormal results of liver function study [R94.5] 12/08/2011  . HYPOTHYROIDISM [E03.9] 09/19/2009  . DEGENERATIVE JOINT DISEASE [M19.90] 09/19/2009  . ARTHRITIS [M12.9] 09/19/2009  . DEGENERATIVE DISC DISEASE [IMO0002] 09/19/2009  . NECK PAIN, CHRONIC [M54.2] 09/19/2009  . BACK PAIN, CHRONIC [M54.9] 09/19/2009  . OSGOOD SCHLATTER'S DISEASE [M92.8] 09/19/2009  . DIZZINESS [R42] 09/19/2009  . DYSPNEA [R06.02] 09/19/2009  . ANXIETY [F41.1] 07/18/2009  . DEPRESSION [F32.9] 07/18/2009  . ORTHOSTATIC HYPOTENSION [I95.1] 07/18/2009  . SKIN CANCER, HX OF [Z85.828] 07/18/2009    Total Time spent with patient: 45 minutes  Musculoskeletal: Strength & Muscle Tone: within normal limits Gait & Station: normal Patient leans: N/A  Psychiatric Specialty Exam: Physical Exam  Nursing note and vitals reviewed. Constitutional: He is oriented to person, place, and time. He appears well-developed and well-nourished.  HENT:  Head: Normocephalic.  Neck: Normal range of motion.  Respiratory: Effort normal.   Musculoskeletal: Normal range of motion.  Neurological: He is alert and oriented to person, place, and time.  Psychiatric: He has a normal mood and affect. His speech is normal and behavior is normal. Judgment and thought content normal. Cognition and memory are impaired.    Review of Systems  All other systems reviewed and are negative.   Blood pressure (!) 141/72, pulse 73, temperature 97.7 F (36.5 C), temperature source Oral, resp. rate 20, SpO2 100 %.There is no height or weight on file to calculate BMI.  General Appearance: Well Groomed  Eye Contact:  Good  Speech:  Clear and Coherent  Volume:  Normal  Mood:  Euthymic  Affect:  Congruent  Thought Process:  Goal Directed  Orientation:  Full (Time, Place, and Person)  Thought Content:  Logical  Suicidal Thoughts:  No  Homicidal Thoughts:  No  Memory:  Immediate;   Good Recent;   Good Remote;   Good  Judgement:  Fair  Insight:  Fair  Psychomotor Activity:  Normal  Concentration:  Concentration: Good and Attention Span: Good  Recall:  Good  Fund of Knowledge:  Fair  Language:  Good  Akathisia:  No  Handed:  Right  AIMS (if indicated):     Assets:  Housing Social Support  ADL's:  Intact  Cognition:  WNL  Sleep:       Mental Status Per Nursing Assessment::   On Admission:   agitation  Demographic Factors:  Male and Caucasian  Loss Factors: NA  Historical Factors: NA  Risk Reduction Factors:   Sense of responsibility to family, Living with another person, especially a relative and Positive social support  Continued Clinical Symptoms:  None  Cognitive Features That Contribute To Risk:  None    Suicide Risk:  Minimal: No  identifiable suicidal ideation.  Patients presenting with no risk factors but with morbid ruminations; may be classified as minimal risk based on the severity of the depressive symptoms    Plan Of Care/Follow-up recommendations:  Activity:  as tolerated Diet:  heaert healthy  diet  Hayden Leonhart, NP 03/25/2018, 3:00 PM

## 2018-10-27 ENCOUNTER — Other Ambulatory Visit: Payer: Self-pay | Admitting: Internal Medicine

## 2018-10-27 DIAGNOSIS — R9341 Abnormal radiologic findings on diagnostic imaging of renal pelvis, ureter, or bladder: Secondary | ICD-10-CM

## 2018-10-31 ENCOUNTER — Ambulatory Visit (INDEPENDENT_AMBULATORY_CARE_PROVIDER_SITE_OTHER): Payer: No Typology Code available for payment source

## 2018-10-31 DIAGNOSIS — R9341 Abnormal radiologic findings on diagnostic imaging of renal pelvis, ureter, or bladder: Secondary | ICD-10-CM | POA: Diagnosis not present

## 2020-04-14 ENCOUNTER — Emergency Department (INDEPENDENT_AMBULATORY_CARE_PROVIDER_SITE_OTHER)
Admission: EM | Admit: 2020-04-14 | Discharge: 2020-04-14 | Disposition: A | Discharge status: Home / Self Care | Payer: No Typology Code available for payment source | Source: Home / Self Care

## 2020-04-14 ENCOUNTER — Other Ambulatory Visit: Payer: Self-pay

## 2020-04-14 DIAGNOSIS — S61412A Laceration without foreign body of left hand, initial encounter: Secondary | ICD-10-CM | POA: Diagnosis not present

## 2020-04-14 DIAGNOSIS — Z23 Encounter for immunization: Secondary | ICD-10-CM | POA: Diagnosis not present

## 2020-04-14 DIAGNOSIS — S0101XA Laceration without foreign body of scalp, initial encounter: Secondary | ICD-10-CM

## 2020-04-14 DIAGNOSIS — W11XXXA Fall on and from ladder, initial encounter: Secondary | ICD-10-CM | POA: Diagnosis not present

## 2020-04-14 DIAGNOSIS — M25522 Pain in left elbow: Secondary | ICD-10-CM

## 2020-04-14 DIAGNOSIS — S01311A Laceration without foreign body of right ear, initial encounter: Secondary | ICD-10-CM

## 2020-04-14 DIAGNOSIS — M542 Cervicalgia: Secondary | ICD-10-CM

## 2020-04-14 MED ORDER — TETANUS-DIPHTH-ACELL PERTUSSIS 5-2.5-18.5 LF-MCG/0.5 IM SUSP
0.5000 mL | Freq: Once | INTRAMUSCULAR | Status: AC
  Administered 2020-04-14: 0.5 mL via INTRAMUSCULAR

## 2020-04-14 NOTE — ED Provider Notes (Signed)
[facility]    CSN: [id number] Arrival date & time: 04/14/20  1632      History   Chief Complaint Chief Complaint  Patient presents with  . Fall  . Head Laceration  . Extremity Laceration    HPI Hayden Rose is a 69 y.o. male.   HPI  Hayden Rose is a 69 y.o. male presenting to UC with c/o laceration to the back of his head, back of his Right ear, Left elbow pain and laceration to his Left hand.  Pt reports initially hitting his Right ear on a pool pump compartment earlier today while standing up after working on pump.  Bleeding controlled PTA but family was concerned he may need stitches behind his Right ear. Then, just PTA, pt fell off a 91ft ladder trying to get a walker down for his wife who twisted her ankle this morning moving furniture. Pt landed on his buttock and back but hit the back of his head, Left elbow, and Left hand on a lawnmower.  He is unsure what he cut himself on.  Bleeding controlled with bandages applied at home. He is not on blood thinners. Hx of chronic pain, he reports Left upper muscle tightness but states it is c/w his chronic pain.  Denies HA, dizziness, nausea. No LOC.  Pt's daughter accompanying pt, states he is acting at baseline.    Past Medical History:  Diagnosis Date  . Anxiety   . Arthritis   . Back pain    chronic  . Chronic kidney disease    stage III  renal disease   . Degenerative disc disease   . Depression   . Dizziness   . Dupuytren's contracture of right hand   . Dyspnea   . Elevated LFTs   . Erectile dysfunction   . Hypothyroidism   . Jaundice with stoppage of bile flow    after stent romoved  . Lewy body dementia ([place])   . Memory deficits 04/23/2013  . Orthostatic hypotension   . Osgood-Schlatter's disease   . Restless leg syndrome 04/23/2013  . Skin cancer    basal cell on back x2  . Sleep apnea with use of continuous positive airway pressure (CPAP)   . Tachycardia    heart monitor last week for 48 hours  no report yet    Patient Active Problem List   Diagnosis Date Noted  . Dementia with behavioral disturbance ([place]) 03/25/2018  . Benzodiazepine dependence ([place]) 10/02/2015  . Polypharmacy 10/02/2015  . Opioid use disorder, moderate, dependence ([place]) 10/02/2015  . Acute encephalopathy 10/01/2015  . Altered mental status 10/01/2015  . Facial droop 09/10/2015  . Renal insufficiency   . Central sleep apnea comorbid with prescribed opioid use, moderate 04/02/2014  . Memory deficits 04/23/2013  . Restless leg syndrome 04/23/2013  . Sleep apnea with use of continuous positive airway pressure (CPAP)   . Nonspecific abnormal results of liver function study 12/08/2011  . HYPOTHYROIDISM 09/19/2009  . DEGENERATIVE JOINT DISEASE 09/19/2009  . ARTHRITIS 09/19/2009  . DEGENERATIVE DISC DISEASE 09/19/2009  . NECK PAIN, CHRONIC 09/19/2009  . BACK PAIN, CHRONIC 09/19/2009  . OSGOOD SCHLATTER'S DISEASE 09/19/2009  . DIZZINESS 09/19/2009  . DYSPNEA 09/19/2009  . ANXIETY 07/18/2009  . DEPRESSION 07/18/2009  . ORTHOSTATIC HYPOTENSION 07/18/2009  . SKIN CANCER, HX OF 07/18/2009    Past Surgical History:  Procedure Laterality Date  . APPENDECTOMY    . BASAL CELL CARCINOMA EXCISION  2008,2009  . CARPAL TUNNEL RELEASE  2003  . [place]  . CHOLECYSTECTOMY    . GALLBLADDER SURGERY  2007-2008  . LIVER BIOPSY  12/27/2011   Procedure: LIVER BIOPSY;  Surgeon: Inda Castle, MD;  Location: [facility];  Service: Endoscopy;  Laterality: N/A;  pt moved from 3/22 to 3/18 ( aw)  . LUMBAR FUSION  1996  . TONSILLECTOMY         Home Medications    Prior to Admission medications   Medication Sig Start Date End Date Taking? Authorizing Provider  acetaminophen (TYLENOL) 325 MG tablet Take 650 mg by mouth 3 (three) times daily.    [provider]  aspirin 81 MG chewable tablet Chew 1 tablet (81 mg total) by mouth daily. 09/11/15   Maryellen Pile, MD  atorvastatin (LIPITOR) 80 MG  tablet Take 1 tablet (80 mg total) by mouth daily at 6 PM. Patient not taking: Reported on 09/30/2015 09/11/15   Maryellen Pile, MD  busPIRone (BUSPAR) 10 MG tablet Take 10 mg by mouth 2 (two) times daily.    [provider]  divalproex (DEPAKOTE) 500 MG DR tablet Take 500 mg by mouth 2 (two) times daily.    [provider]  escitalopram (LEXAPRO) 20 MG tablet Take 20 mg by mouth daily.    [provider]  hydrochlorothiazide (HYDRODIURIL) 25 MG tablet Take 25 mg by mouth daily.    [provider]  HYDROcodone-acetaminophen (NORCO) 10-325 MG tablet Take 1 tablet by mouth 2 (two) times daily.    [provider]  hydrOXYzine (ATARAX/VISTARIL) 25 MG tablet Take 25 mg by mouth 3 (three) times daily.    [provider]  levothyroxine (SYNTHROID, LEVOTHROID) 75 MCG tablet Take 75 mcg by mouth daily before breakfast.    [provider]  loperamide (IMODIUM A-D) 2 MG tablet Take 2 mg by mouth 4 (four) times daily as needed for diarrhea or loose stools.    [provider]  risperiDONE (RISPERDAL) 0.5 MG tablet Take 1 tablet (0.5 mg total) by mouth 2 (two) times daily. 03/25/18   Patrecia Pour, NP  rOPINIRole (REQUIP) 0.25 MG tablet Take 1 tablet (0.25 mg total) by mouth at bedtime. 03/25/18   Patrecia Pour, NP    Family History Family History  Problem Relation Age of Onset  . Coronary artery disease Mother   . Heart attack Mother        stents in neck and heart ?  Marland Kitchen Crohn's disease Mother   . Cirrhosis Father   . Cirrhosis Sister   . Prostate cancer Maternal Uncle   . Liver cancer Cousin   . Colitis Daughter   . Diabetes Maternal Uncle   . Diabetes Brother   . Kidney disease Maternal Aunt   . Kidney disease Cousin     Social History Social History   Tobacco Use  . Smoking status: Former Smoker    Quit date: 12/27/1975    Years since quitting: 44.3  . Smokeless tobacco: Never Used  Substance Use Topics  . Alcohol  use: No  . Drug use: No    Comment: history of opiate abuse     Allergies   Vesicare [solifenacin succinate], Aricept [donepezil hydrochloride], Lamictal [lamotrigine], Namenda [memantine hcl], Oxycodone hcl, Sulfur, Exelon [rivastigmine], Fentanyl, and Penicillins   Review of Systems Review of Systems  Musculoskeletal: Positive for arthralgias, back pain, myalgias, neck pain (Left side) and neck stiffness. Negative for gait problem and joint swelling.  Skin: Positive for wound. Negative  for color change.  Neurological: Negative for dizziness and headaches.     Physical Exam  No data found.  Updated Vital Signs BP (!) 170/83 (BP Location: Right Arm)   Pulse 65   Temp 98.4 F (36.9 C) (Oral)   Resp 16   SpO2 100%   Visual Acuity Right Eye Distance:   Left Eye Distance:   Bilateral Distance:    Right Eye Near:   Left Eye Near:    Bilateral Near:     Physical Exam Vitals and nursing note reviewed.  Constitutional:      Appearance: Normal appearance. He is well-developed.     Comments: Please male sitting in exam chair, NAD, alert and cooperative during exam.   HENT:     Head: Normocephalic.      Nose: Nose normal.     Mouth/Throat:     Mouth: Mucous membranes are moist.     Pharynx: Oropharynx is clear.  Cardiovascular:     Rate and Rhythm: Normal rate and regular rhythm.  Pulmonary:     Effort: Pulmonary effort is normal. No respiratory distress.     Breath sounds: Normal breath sounds.  Musculoskeletal:        General: Tenderness present. No swelling. Normal range of motion.     Cervical back: Normal range of motion.     Comments: No midline spinal tenderness. Tenderness to Left upper trapezius.  Left shoulder: no bony tenderness, full ROM. Left elbow: no edema, mild diffuse tenderness. Slight decreased flexion and slight decreased extension (chronic per pt)  Left hand: full ROM, mild tenderness to palm aspect (laceration)  Skin:    General: Skin is warm  and dry.     Capillary Refill: Capillary refill takes less than 2 seconds.     Comments: Left hand, palm aspect over thenar muscle: 7cm 'C' shaped laceration through dermis. No active bleeding. Dirt/debris noted.   Neurological:     General: No focal deficit present.     Mental Status: He is alert and oriented to person, place, and time.     Sensory: No sensory deficit.  Psychiatric:        Mood and Affect: Mood normal.        Behavior: Behavior normal.      UC Treatments / Results  Labs (all labs ordered are listed, but only abnormal results are displayed) Labs Reviewed - No data to display  EKG   Radiology No results found.  Procedures Laceration Repair  Date/Time: 04/14/2020 6:27 PM Performed by: Noe Gens, PA-C Authorized by: Noe Gens, PA-C   Consent:    Consent obtained:  Verbal   Consent given by:  Patient   Risks discussed:  Infection, pain, poor cosmetic result, poor wound healing, need for additional repair, nerve damage and retained foreign body   Alternatives discussed:  No treatment and delayed treatment (steri-strips- decided on sutures as pt uses his hands often, may get wet often) Anesthesia (see MAR for exact dosages):    Anesthesia method:  Local infiltration   Local anesthetic:  Lidocaine 2% WITH epi Laceration details:    Location:  Hand   Hand location:  L palm   Length (cm):  7   Depth (mm):  3 Repair type:    Repair type:  Simple Pre-procedure details:    Preparation:  Patient was prepped and draped in usual sterile fashion Exploration:    Hemostasis achieved with:  Direct pressure   Wound exploration: wound explored through  full range of motion and entire depth of wound probed and visualized     Wound extent: foreign bodies/material     Wound extent: no areolar tissue violation noted, no fascia violation noted, no muscle damage noted, no nerve damage noted, no tendon damage noted, no underlying fracture noted and no vascular damage  noted     Foreign bodies/material:  Dirt?   Contaminated: yes   Treatment:    Area cleansed with:  Hibiclens and saline   Amount of cleaning:  Extensive   Irrigation solution:  Sterile water   Irrigation volume:  100cc   Irrigation method:  Syringe Skin repair:    Repair method:  Sutures   Suture size:  4-0   Suture material:  Prolene   Suture technique:  Simple interrupted   Number of sutures:  7 Approximation:    Approximation:  Close Post-procedure details:    Dressing:  Antibiotic ointment, bulky dressing and non-adherent dressing   Patient tolerance of procedure:  Tolerated well, no immediate complications Laceration Repair  Date/Time: 04/14/2020 6:29 PM Performed by: Noe Gens, PA-C Authorized by: Noe Gens, PA-C   Consent:    Consent obtained:  Verbal   Consent given by:  Patient   Risks discussed:  Pain, infection and poor wound healing   Alternatives discussed:  No treatment Anesthesia (see MAR for exact dosages):    Anesthesia method:  None Laceration details:    Location:  Ear   Ear location:  R ear   Length (cm):  1   Depth (mm):  2 Repair type:    Repair type:  Simple Pre-procedure details:    Preparation:  Patient was prepped and draped in usual sterile fashion Exploration:    Hemostasis achieved with:  Direct pressure   Wound exploration: wound explored through full range of motion and entire depth of wound probed and visualized     Wound extent: no areolar tissue violation noted, no fascia violation noted, no foreign bodies/material noted, no muscle damage noted, no nerve damage noted, no tendon damage noted, no underlying fracture noted and no vascular damage noted     Contaminated: no   Treatment:    Area cleansed with:  Saline   Amount of cleaning:  Standard Skin repair:    Repair method:  Tissue adhesive Approximation:    Approximation:  Close Post-procedure details:    Dressing:  Open (no dressing)   Patient tolerance of procedure:   Tolerated well, no immediate complications Laceration Repair  Date/Time: 04/14/2020 6:29 PM Performed by: Noe Gens, PA-C Authorized by: Noe Gens, PA-C   Consent:    Consent obtained:  Verbal   Consent given by:  Patient   Risks discussed:  Infection, pain and need for additional repair   Alternatives discussed:  No treatment and delayed treatment Anesthesia (see MAR for exact dosages):    Anesthesia method:  None Laceration details:    Location:  Scalp   Scalp location:  Occipital   Length (cm):  3   Depth (mm):  3 Repair type:    Repair type:  Simple Pre-procedure details:    Preparation:  Patient was prepped and draped in usual sterile fashion Exploration:    Hemostasis achieved with:  Direct pressure   Wound exploration: wound explored through full range of motion and entire depth of wound probed and visualized     Wound extent: no areolar tissue violation noted, no fascia violation noted, no foreign bodies/material noted, no muscle  damage noted, no nerve damage noted, no tendon damage noted, no underlying fracture noted and no vascular damage noted     Contaminated: no   Treatment:    Area cleansed with:  Saline and Hibiclens   Amount of cleaning:  Standard Skin repair:    Repair method:  Staples   Number of staples:  3 Approximation:    Approximation:  Close Post-procedure details:    Dressing:  Open (no dressing)   Patient tolerance of procedure:  Tolerated well, no immediate complications   (including critical care time)  Medications Ordered in UC Medications  Tdap (BOOSTRIX) injection 0.5 mL (0.5 mLs Intramuscular Given 04/14/20 1808)    Initial Impression / Assessment and Plan / UC Course  I have reviewed the triage vital signs and the nursing notes.  Pertinent labs & imaging results that were available during my care of the patient were reviewed by me and considered in my medical decision making (see chart for details).     Multiple wounds  repaired as noted above: Left hand: SEVEN sutures Right ear: posterior, tissue adhesive Occipital scalp: THREE staples  Discussed imaging with pt, pt insists neck muscle pain is chronic, no midline spinal tenderness on exam Left elbow pain also chronic per pt. Discussed imaging with daughter as well, pt and daughter agreeable to hold off on imaging at this time.  Encouraged close f/u with PCP or orthopedist.  F/u in 5-7 days for staple removal, 10-14 days for suture removal F/u sooner if needed Discussed symptoms that warrant emergent care in the ED. AVS given  Final Clinical Impressions(s) / UC Diagnoses   Final diagnoses:  Fall from ladder, initial encounter  Scalp laceration, initial encounter  Laceration of right ear, initial encounter  Laceration of left hand, initial encounter  Neck pain on left side  Left elbow pain     Discharge Instructions      Keep wounds clean with warm water and mild soap. Pat dry.  Use caution when washing and combing hair.  Ask for help if needed to make sure you do not snag the staples on your towel, clothing or your comb.  You should keep a bandage on your Left hand, change 1-2 times daily, more often if dirty or wet.    Do NOT soak your hand or head as this increases the risk of infection.  Keep a close eye on your wounds. If you develop worsening pain, redness, drainage or pus, or fever, return for further evaluation for possible infection.   You may take 500mg  acetaminophen every 4-6 hours or in combination with ibuprofen 400-600mg  every 6-8 hours as needed for pain and inflammation.   Follow up in 5-7 days for removal of staples from your head. Follow up in 10-14 days for removal of sutures from your Left hand.   Call 911 or have someone drive you to the hospital if you develop a severe headache, change in vision, nausea/vomiting, weakness in arms or legs, or other new concerning symptom develop.     ED Prescriptions    None      PDMP not reviewed this encounter.   Noe Gens, [place] 04/14/20 [phone]

## 2020-04-14 NOTE — Discharge Instructions (Signed)
  Keep wounds clean with warm water and mild soap. Pat dry.  Use caution when washing and combing hair.  Ask for help if needed to make sure you do not snag the staples on your towel, clothing or your comb.  You should keep a bandage on your Left hand, change 1-2 times daily, more often if dirty or wet.    Do NOT soak your hand or head as this increases the risk of infection.  Keep a close eye on your wounds. If you develop worsening pain, redness, drainage or pus, or fever, return for further evaluation for possible infection.   You may take 500mg  acetaminophen every 4-6 hours or in combination with ibuprofen 400-600mg  every 6-8 hours as needed for pain and inflammation.   Follow up in 5-7 days for removal of staples from your head. Follow up in 10-14 days for removal of sutures from your Left hand.   Call 911 or have someone drive you to the hospital if you develop a severe headache, change in vision, nausea/vomiting, weakness in arms or legs, or other new concerning symptom develop.

## 2020-04-14 NOTE — ED Triage Notes (Signed)
Patient presents to Urgent Care with complaints of falling off a ladder (just a few steps up) since earlier today. Patient reports he hit the back of his head, left elbow, and left hand. Pt has bandage in place on head and hand upon arrival, hx of dementia, unsure of tetanus but thinks he probably needs one.

## 2020-08-11 ENCOUNTER — Other Ambulatory Visit: Payer: Self-pay

## 2020-08-11 ENCOUNTER — Emergency Department (HOSPITAL_COMMUNITY): Payer: No Typology Code available for payment source

## 2020-08-11 ENCOUNTER — Inpatient Hospital Stay (HOSPITAL_COMMUNITY)
Admission: EM | Admit: 2020-08-11 | Discharge: 2020-08-14 | DRG: 184 | Disposition: A | Discharge status: Home / Self Care | Payer: No Typology Code available for payment source | Attending: General Surgery | Admitting: General Surgery

## 2020-08-11 ENCOUNTER — Encounter (HOSPITAL_COMMUNITY): Payer: Self-pay | Admitting: *Deleted

## 2020-08-11 ENCOUNTER — Observation Stay (HOSPITAL_COMMUNITY): Payer: No Typology Code available for payment source

## 2020-08-11 DIAGNOSIS — G2581 Restless legs syndrome: Secondary | ICD-10-CM | POA: Diagnosis present

## 2020-08-11 DIAGNOSIS — N182 Chronic kidney disease, stage 2 (mild): Secondary | ICD-10-CM | POA: Diagnosis present

## 2020-08-11 DIAGNOSIS — E039 Hypothyroidism, unspecified: Secondary | ICD-10-CM | POA: Diagnosis present

## 2020-08-11 DIAGNOSIS — Z85828 Personal history of other malignant neoplasm of skin: Secondary | ICD-10-CM

## 2020-08-11 DIAGNOSIS — E785 Hyperlipidemia, unspecified: Secondary | ICD-10-CM | POA: Diagnosis present

## 2020-08-11 DIAGNOSIS — Z8249 Family history of ischemic heart disease and other diseases of the circulatory system: Secondary | ICD-10-CM

## 2020-08-11 DIAGNOSIS — W19XXXA Unspecified fall, initial encounter: Secondary | ICD-10-CM

## 2020-08-11 DIAGNOSIS — Y9301 Activity, walking, marching and hiking: Secondary | ICD-10-CM | POA: Diagnosis present

## 2020-08-11 DIAGNOSIS — F419 Anxiety disorder, unspecified: Secondary | ICD-10-CM | POA: Diagnosis present

## 2020-08-11 DIAGNOSIS — N179 Acute kidney failure, unspecified: Secondary | ICD-10-CM | POA: Diagnosis present

## 2020-08-11 DIAGNOSIS — Z888 Allergy status to other drugs, medicaments and biological substances status: Secondary | ICD-10-CM

## 2020-08-11 DIAGNOSIS — Y92009 Unspecified place in unspecified non-institutional (private) residence as the place of occurrence of the external cause: Secondary | ICD-10-CM

## 2020-08-11 DIAGNOSIS — Z23 Encounter for immunization: Secondary | ICD-10-CM

## 2020-08-11 DIAGNOSIS — Z833 Family history of diabetes mellitus: Secondary | ICD-10-CM

## 2020-08-11 DIAGNOSIS — Z20822 Contact with and (suspected) exposure to covid-19: Secondary | ICD-10-CM | POA: Diagnosis present

## 2020-08-11 DIAGNOSIS — F32A Depression, unspecified: Secondary | ICD-10-CM | POA: Diagnosis present

## 2020-08-11 DIAGNOSIS — Z981 Arthrodesis status: Secondary | ICD-10-CM

## 2020-08-11 DIAGNOSIS — G4730 Sleep apnea, unspecified: Secondary | ICD-10-CM | POA: Diagnosis present

## 2020-08-11 DIAGNOSIS — S2249XA Multiple fractures of ribs, unspecified side, initial encounter for closed fracture: Secondary | ICD-10-CM

## 2020-08-11 DIAGNOSIS — W108XXA Fall (on) (from) other stairs and steps, initial encounter: Secondary | ICD-10-CM

## 2020-08-11 DIAGNOSIS — G3183 Dementia with Lewy bodies: Secondary | ICD-10-CM | POA: Diagnosis present

## 2020-08-11 DIAGNOSIS — Z800 Family history of malignant neoplasm of digestive organs: Secondary | ICD-10-CM

## 2020-08-11 DIAGNOSIS — Z882 Allergy status to sulfonamides status: Secondary | ICD-10-CM

## 2020-08-11 DIAGNOSIS — Z8042 Family history of malignant neoplasm of prostate: Secondary | ICD-10-CM

## 2020-08-11 DIAGNOSIS — Z880 Allergy status to penicillin: Secondary | ICD-10-CM

## 2020-08-11 DIAGNOSIS — S2242XA Multiple fractures of ribs, left side, initial encounter for closed fracture: Principal | ICD-10-CM | POA: Diagnosis present

## 2020-08-11 DIAGNOSIS — W109XXA Fall (on) (from) unspecified stairs and steps, initial encounter: Secondary | ICD-10-CM | POA: Diagnosis present

## 2020-08-11 DIAGNOSIS — I129 Hypertensive chronic kidney disease with stage 1 through stage 4 chronic kidney disease, or unspecified chronic kidney disease: Secondary | ICD-10-CM | POA: Diagnosis present

## 2020-08-11 DIAGNOSIS — G629 Polyneuropathy, unspecified: Secondary | ICD-10-CM | POA: Diagnosis present

## 2020-08-11 DIAGNOSIS — F0391 Unspecified dementia with behavioral disturbance: Secondary | ICD-10-CM | POA: Diagnosis present

## 2020-08-11 LAB — COMPREHENSIVE METABOLIC PANEL
ALT: 15 U/L (ref 0–44)
AST: 25 U/L (ref 15–41)
Albumin: 4.1 g/dL (ref 3.5–5.0)
Alkaline Phosphatase: 48 U/L (ref 38–126)
Anion gap: 8 (ref 5–15)
BUN: 20 mg/dL (ref 8–23)
CO2: 31 mmol/L (ref 22–32)
Calcium: 9.2 mg/dL (ref 8.9–10.3)
Chloride: 97 mmol/L — ABNORMAL LOW (ref 98–111)
Creatinine, Ser: 1.35 mg/dL — ABNORMAL HIGH (ref 0.61–1.24)
GFR, Estimated: 57 mL/min — ABNORMAL LOW (ref >60)
Glucose, Bld: 140 mg/dL — ABNORMAL HIGH (ref 70–99)
Potassium: 3.9 mmol/L (ref 3.5–5.1)
Sodium: 136 mmol/L (ref 135–145)
Total Bilirubin: 0.7 mg/dL (ref 0.3–1.2)
Total Protein: 6.4 g/dL — ABNORMAL LOW (ref 6.5–8.1)

## 2020-08-11 LAB — RESPIRATORY PANEL BY RT PCR (FLU A&B, COVID)
Influenza A by PCR: NEGATIVE
Influenza B by PCR: NEGATIVE
SARS Coronavirus 2 by RT PCR: NEGATIVE

## 2020-08-11 LAB — I-STAT CHEM 8, ED
BUN: 24 mg/dL — ABNORMAL HIGH (ref 8–23)
Calcium, Ion: 1.12 mmol/L — ABNORMAL LOW (ref 1.15–1.40)
Chloride: 99 mmol/L (ref 98–111)
Creatinine, Ser: 1.3 mg/dL — ABNORMAL HIGH (ref 0.61–1.24)
Glucose, Bld: 103 mg/dL — ABNORMAL HIGH (ref 70–99)
HCT: 40 % (ref 39.0–52.0)
Hemoglobin: 13.6 g/dL (ref 13.0–17.0)
Potassium: 4.3 mmol/L (ref 3.5–5.1)
Sodium: 137 mmol/L (ref 135–145)
TCO2: 31 mmol/L (ref 22–32)

## 2020-08-11 LAB — CBC WITH DIFFERENTIAL/PLATELET
Abs Immature Granulocytes: 0.08 10*3/uL — ABNORMAL HIGH (ref 0.00–0.07)
Basophils Absolute: 0.1 10*3/uL (ref 0.0–0.1)
Basophils Relative: 1 %
Eosinophils Absolute: 0.2 10*3/uL (ref 0.0–0.5)
Eosinophils Relative: 1 %
HCT: 42.5 % (ref 39.0–52.0)
Hemoglobin: 14.3 g/dL (ref 13.0–17.0)
Immature Granulocytes: 1 %
Lymphocytes Relative: 11 %
Lymphs Abs: 1.5 10*3/uL (ref 0.7–4.0)
MCH: 30.4 pg (ref 26.0–34.0)
MCHC: 33.6 g/dL (ref 30.0–36.0)
MCV: 90.2 fL (ref 80.0–100.0)
Monocytes Absolute: 1 10*3/uL (ref 0.1–1.0)
Monocytes Relative: 7 %
Neutro Abs: 11.6 10*3/uL — ABNORMAL HIGH (ref 1.7–7.7)
Neutrophils Relative %: 79 %
Platelets: 185 10*3/uL (ref 150–400)
RBC: 4.71 MIL/uL (ref 4.22–5.81)
RDW: 12.5 % (ref 11.5–15.5)
WBC: 14.5 10*3/uL — ABNORMAL HIGH (ref 4.0–10.5)
nRBC: 0 % (ref 0.0–0.2)

## 2020-08-11 LAB — HIV ANTIBODY (ROUTINE TESTING W REFLEX): HIV Screen 4th Generation wRfx: NONREACTIVE

## 2020-08-11 MED ORDER — LIDOCAINE 5 % EX PTCH
1.0000 | MEDICATED_PATCH | CUTANEOUS | Status: DC
  Administered 2020-08-11: 1 via TRANSDERMAL
  Filled 2020-08-11: qty 1

## 2020-08-11 MED ORDER — HYDROMORPHONE HCL 1 MG/ML IJ SOLN
1.0000 mg | INTRAMUSCULAR | Status: DC | PRN
  Administered 2020-08-11 – 2020-08-14 (×9): 1 mg via INTRAVENOUS
  Filled 2020-08-11 (×9): qty 1

## 2020-08-11 MED ORDER — DIPHENOXYLATE-ATROPINE 2.5-0.025 MG PO TABS
1.0000 | ORAL_TABLET | Freq: Two times a day (BID) | ORAL | Status: DC
  Administered 2020-08-12 – 2020-08-14 (×4): 1 via ORAL
  Filled 2020-08-11 (×4): qty 1

## 2020-08-11 MED ORDER — MORPHINE SULFATE (PF) 4 MG/ML IV SOLN
6.0000 mg | Freq: Once | INTRAVENOUS | Status: AC
  Administered 2020-08-11: 6 mg via INTRAVENOUS
  Filled 2020-08-11: qty 2

## 2020-08-11 MED ORDER — KETOROLAC TROMETHAMINE 15 MG/ML IJ SOLN
15.0000 mg | Freq: Three times a day (TID) | INTRAMUSCULAR | Status: DC
  Administered 2020-08-11 – 2020-08-12 (×2): 15 mg via INTRAVENOUS
  Filled 2020-08-11 (×2): qty 1

## 2020-08-11 MED ORDER — DULOXETINE HCL 30 MG PO CPEP
30.0000 mg | ORAL_CAPSULE | Freq: Two times a day (BID) | ORAL | Status: DC
  Administered 2020-08-11 – 2020-08-14 (×6): 30 mg via ORAL
  Filled 2020-08-11 (×8): qty 1

## 2020-08-11 MED ORDER — MORPHINE SULFATE (PF) 2 MG/ML IV SOLN
1.0000 mg | INTRAVENOUS | Status: DC | PRN
  Administered 2020-08-11 – 2020-08-13 (×8): 2 mg via INTRAVENOUS
  Filled 2020-08-11 (×8): qty 1

## 2020-08-11 MED ORDER — LEVOTHYROXINE SODIUM 25 MCG PO TABS
137.0000 ug | ORAL_TABLET | Freq: Every day | ORAL | Status: DC
  Administered 2020-08-12 – 2020-08-14 (×3): 137 ug via ORAL
  Filled 2020-08-11 (×3): qty 1

## 2020-08-11 MED ORDER — MORPHINE SULFATE (PF) 4 MG/ML IV SOLN
4.0000 mg | Freq: Once | INTRAVENOUS | Status: AC
  Administered 2020-08-11: 4 mg via INTRAVENOUS
  Filled 2020-08-11: qty 1

## 2020-08-11 MED ORDER — POLYVINYL ALCOHOL 1.4 % OP SOLN
1.0000 [drp] | Freq: Every day | OPHTHALMIC | Status: DC
  Administered 2020-08-11 – 2020-08-13 (×3): 1 [drp] via OPHTHALMIC
  Filled 2020-08-11 (×2): qty 15

## 2020-08-11 MED ORDER — BISACODYL 10 MG RE SUPP: 10.0000 mg | Freq: Every day | RECTAL | Status: DC | PRN

## 2020-08-11 MED ORDER — ENOXAPARIN SODIUM 30 MG/0.3ML ~~LOC~~ SOLN
30.0000 mg | Freq: Two times a day (BID) | SUBCUTANEOUS | Status: DC
  Administered 2020-08-12 – 2020-08-13 (×4): 30 mg via SUBCUTANEOUS
  Filled 2020-08-11 (×4): qty 0.3

## 2020-08-11 MED ORDER — ACETAMINOPHEN 500 MG PO TABS
1000.0000 mg | ORAL_TABLET | Freq: Once | ORAL | Status: AC
  Administered 2020-08-11: 1000 mg via ORAL
  Filled 2020-08-11: qty 2

## 2020-08-11 MED ORDER — COLESTIPOL HCL 1 G PO TABS
1.0000 g | ORAL_TABLET | Freq: Two times a day (BID) | ORAL | Status: DC
  Administered 2020-08-13: 1 g via ORAL
  Filled 2020-08-11 (×9): qty 1

## 2020-08-11 MED ORDER — DOCUSATE SODIUM 100 MG PO CAPS
100.0000 mg | ORAL_CAPSULE | Freq: Two times a day (BID) | ORAL | Status: DC
  Administered 2020-08-11: 100 mg via ORAL
  Filled 2020-08-11: qty 1

## 2020-08-11 MED ORDER — ONDANSETRON HCL 4 MG/2ML IJ SOLN: 4.0000 mg | Freq: Four times a day (QID) | INTRAMUSCULAR | Status: DC | PRN

## 2020-08-11 MED ORDER — ACETAMINOPHEN 500 MG PO TABS
1000.0000 mg | ORAL_TABLET | Freq: Three times a day (TID) | ORAL | Status: DC
  Administered 2020-08-11 – 2020-08-12 (×2): 1000 mg via ORAL
  Filled 2020-08-11 (×2): qty 2

## 2020-08-11 MED ORDER — CARBIDOPA-LEVODOPA 25-100 MG PO TABS
1.0000 | ORAL_TABLET | Freq: Every day | ORAL | Status: DC
  Administered 2020-08-12 – 2020-08-14 (×3): 1 via ORAL
  Filled 2020-08-11 (×3): qty 1

## 2020-08-11 MED ORDER — CARBIDOPA-LEVODOPA 25-100 MG PO TABS
2.0000 | ORAL_TABLET | Freq: Two times a day (BID) | ORAL | Status: DC
  Administered 2020-08-11 – 2020-08-13 (×5): 2 via ORAL
  Filled 2020-08-11 (×6): qty 2

## 2020-08-11 MED ORDER — POLYETHYLENE GLYCOL 3350 17 G PO PACK
17.0000 g | PACK | Freq: Every day | ORAL | Status: DC | PRN
  Administered 2020-08-13: 17 g via ORAL
  Filled 2020-08-11: qty 1

## 2020-08-11 MED ORDER — QUETIAPINE FUMARATE 50 MG PO TABS
100.0000 mg | ORAL_TABLET | Freq: Every day | ORAL | Status: DC
  Administered 2020-08-11 – 2020-08-13 (×3): 100 mg via ORAL
  Filled 2020-08-11: qty 1
  Filled 2020-08-11: qty 2
  Filled 2020-08-11 (×2): qty 1
  Filled 2020-08-11: qty 2

## 2020-08-11 MED ORDER — PREGABALIN 75 MG PO CAPS
150.0000 mg | ORAL_CAPSULE | Freq: Two times a day (BID) | ORAL | Status: DC
  Administered 2020-08-11 – 2020-08-14 (×6): 150 mg via ORAL
  Filled 2020-08-11: qty 2
  Filled 2020-08-11: qty 1
  Filled 2020-08-11 (×2): qty 2
  Filled 2020-08-11: qty 1
  Filled 2020-08-11: qty 2

## 2020-08-11 MED ORDER — OXYCODONE HCL 5 MG PO TABS
5.0000 mg | ORAL_TABLET | ORAL | Status: DC | PRN
  Administered 2020-08-11 – 2020-08-13 (×7): 10 mg via ORAL
  Filled 2020-08-11 (×2): qty 2
  Filled 2020-08-11: qty 1
  Filled 2020-08-11 (×5): qty 2

## 2020-08-11 MED ORDER — METHOCARBAMOL 500 MG PO TABS
500.0000 mg | ORAL_TABLET | Freq: Four times a day (QID) | ORAL | Status: DC
  Administered 2020-08-11 – 2020-08-12 (×3): 500 mg via ORAL
  Filled 2020-08-11 (×4): qty 1

## 2020-08-11 MED ORDER — ONDANSETRON 4 MG PO TBDP: 4.0000 mg | ORAL_TABLET | Freq: Four times a day (QID) | ORAL | Status: DC | PRN

## 2020-08-11 MED ORDER — HYDROMORPHONE HCL 1 MG/ML IJ SOLN
1.0000 mg | Freq: Once | INTRAMUSCULAR | Status: AC
  Administered 2020-08-11: 1 mg via INTRAVENOUS
  Filled 2020-08-11: qty 1

## 2020-08-11 MED ORDER — METOPROLOL TARTRATE 5 MG/5ML IV SOLN: 5.0000 mg | Freq: Four times a day (QID) | INTRAVENOUS | Status: DC | PRN

## 2020-08-11 MED ORDER — MORPHINE SULFATE (PF) 2 MG/ML IV SOLN
2.0000 mg | INTRAVENOUS | Status: DC | PRN
  Administered 2020-08-11 (×2): 2 mg via INTRAVENOUS
  Filled 2020-08-11 (×2): qty 1

## 2020-08-11 MED ORDER — SODIUM CHLORIDE 0.9 % IV SOLN: INTRAVENOUS | Status: DC

## 2020-08-11 NOTE — ED Provider Notes (Signed)
[facility] EMERGENCY DEPARTMENT Provider Note   CSN: [id number] Arrival date & time: 08/11/20  1001     History Chief Complaint  Patient presents with  . Fall    Hayden Rose is a 69 y.o. male.  The history is provided by the patient. No language interpreter was used.  Fall     69 year old male significant history of DDD, arthritis, chronic back pain, orthostatic hypotension, anxiety, depression, opiate use disorder, Lewy body dementia brought here via EMS from home for evaluation of a recent fall.  Patient reported this morning he was walking down the steps wearing only his socks.  States that he slipped and fell landed on his left side of chest against the steps.  He did not hit his head or loss of consciousness.  Subsequently he was able to walk down and called EMS.  He is complaining of 10 out of 10 sharp stabbing pain primarily to the left side of his chest and upper back.  Pain worse with taking a deep breath.  Denies headache neck pain abdominal pain low back pain or pain to his extremities.  He is not on any blood thinner medication.  He denies any precipitating symptoms prior to the fall.  Past Medical History:  Diagnosis Date  . Anxiety   . Arthritis   . Back pain    chronic  . Chronic kidney disease    stage III  renal disease   . Degenerative disc disease   . Depression   . Dizziness   . Dupuytren's contracture of right hand   . Dyspnea   . Elevated LFTs   . Erectile dysfunction   . Hypothyroidism   . Jaundice with stoppage of bile flow    after stent romoved  . Lewy body dementia ([place])   . Memory deficits 04/23/2013  . Orthostatic hypotension   . Osgood-Schlatter's disease   . Restless leg syndrome 04/23/2013  . Skin cancer    basal cell on back x2  . Sleep apnea with use of continuous positive airway pressure (CPAP)   . Tachycardia    heart monitor last week for 48 hours no report yet    Patient Active Problem List   Diagnosis Date  Noted  . Dementia with behavioral disturbance ([place]) 03/25/2018  . Benzodiazepine dependence ([place]) 10/02/2015  . Polypharmacy 10/02/2015  . Opioid use disorder, moderate, dependence ([place]) 10/02/2015  . Acute encephalopathy 10/01/2015  . Altered mental status 10/01/2015  . Facial droop 09/10/2015  . Renal insufficiency   . Central sleep apnea comorbid with prescribed opioid use, moderate 04/02/2014  . Memory deficits 04/23/2013  . Restless leg syndrome 04/23/2013  . Sleep apnea with use of continuous positive airway pressure (CPAP)   . Nonspecific abnormal results of liver function study 12/08/2011  . HYPOTHYROIDISM 09/19/2009  . DEGENERATIVE JOINT DISEASE 09/19/2009  . ARTHRITIS 09/19/2009  . DEGENERATIVE DISC DISEASE 09/19/2009  . NECK PAIN, CHRONIC 09/19/2009  . BACK PAIN, CHRONIC 09/19/2009  . OSGOOD SCHLATTER'S DISEASE 09/19/2009  . DIZZINESS 09/19/2009  . DYSPNEA 09/19/2009  . ANXIETY 07/18/2009  . DEPRESSION 07/18/2009  . ORTHOSTATIC HYPOTENSION 07/18/2009  . SKIN CANCER, HX OF 07/18/2009    Past Surgical History:  Procedure Laterality Date  . APPENDECTOMY    . BASAL CELL CARCINOMA EXCISION  2008,2009  . CARPAL TUNNEL RELEASE  2003  . CERVICAL FUSION  1985  . CHOLECYSTECTOMY    . GALLBLADDER SURGERY  2007-2008  . LIVER BIOPSY  12/27/2011  Procedure: LIVER BIOPSY;  Surgeon: Inda Castle, MD;  Location: [facility];  Service: Endoscopy;  Laterality: N/A;  pt moved from 3/22 to 3/18 ( aw)  . LUMBAR FUSION  1996  . TONSILLECTOMY         Family History  Problem Relation Age of Onset  . Coronary artery disease Mother   . Heart attack Mother        stents in neck and heart ?  Marland Kitchen Crohn's disease Mother   . Cirrhosis Father   . Cirrhosis Sister   . Prostate cancer Maternal Uncle   . Liver cancer Cousin   . Colitis Daughter   . Diabetes Maternal Uncle   . Diabetes Brother   . Kidney disease Maternal Aunt   . Kidney disease Cousin     Social History    Tobacco Use  . Smoking status: Former Smoker    Quit date: 12/27/1975    Years since quitting: 44.6  . Smokeless tobacco: Never Used  Substance Use Topics  . Alcohol use: No  . Drug use: No    Comment: history of opiate abuse    Home Medications Prior to Admission medications   Medication Sig Start Date End Date Taking? Authorizing Provider  acetaminophen (TYLENOL) 325 MG tablet Take 650 mg by mouth 3 (three) times daily.    [provider]  aspirin 81 MG chewable tablet Chew 1 tablet (81 mg total) by mouth daily. 09/11/15   Maryellen Pile, MD  atorvastatin (LIPITOR) 80 MG tablet Take 1 tablet (80 mg total) by mouth daily at 6 PM. Patient not taking: Reported on 09/30/2015 09/11/15   Maryellen Pile, MD  busPIRone (BUSPAR) 10 MG tablet Take 10 mg by mouth 2 (two) times daily.    [provider]  divalproex (DEPAKOTE) 500 MG DR tablet Take 500 mg by mouth 2 (two) times daily.    [provider]  escitalopram (LEXAPRO) 20 MG tablet Take 20 mg by mouth daily.    [provider]  hydrochlorothiazide (HYDRODIURIL) 25 MG tablet Take 25 mg by mouth daily.    [provider]  HYDROcodone-acetaminophen (NORCO) 10-325 MG tablet Take 1 tablet by mouth 2 (two) times daily.    [provider]  hydrOXYzine (ATARAX/VISTARIL) 25 MG tablet Take 25 mg by mouth 3 (three) times daily.    [provider]  levothyroxine (SYNTHROID, LEVOTHROID) 75 MCG tablet Take 75 mcg by mouth daily before breakfast.    [provider]  loperamide (IMODIUM A-D) 2 MG tablet Take 2 mg by mouth 4 (four) times daily as needed for diarrhea or loose stools.    [provider]  risperiDONE (RISPERDAL) 0.5 MG tablet Take 1 tablet (0.5 mg total) by mouth 2 (two) times daily. 03/25/18   Patrecia Pour, NP  rOPINIRole (REQUIP) 0.25 MG tablet Take 1 tablet (0.25 mg total) by mouth at bedtime. 03/25/18   Patrecia Pour, NP    Allergies    Vesicare  [solifenacin succinate], Aricept Reather Littler hydrochloride], Lamictal [lamotrigine], Namenda [memantine hcl], Oxycodone hcl, Sulfur, Exelon [rivastigmine], Fentanyl, and Penicillins  Review of Systems   Review of Systems  All other systems reviewed and are negative.   Physical Exam Updated Vital Signs BP (!) 145/67 (BP Location: Right Arm)   Pulse 72   Temp 97.9 F (36.6 C) (Oral)   Resp 18   SpO2 98%   Physical Exam Vitals and nursing note reviewed.  Constitutional:      General: He  is not in acute distress.    Appearance: He is well-developed.     Comments: Patient is seen laying upright sitting position appears uncomfortable holding his left upper back with his hand.  HENT:     Head: Normocephalic and atraumatic.     Comments: No scalp tenderness. Eyes:     Conjunctiva/sclera: Conjunctivae normal.  Cardiovascular:     Rate and Rhythm: Normal rate and regular rhythm.     Pulses: Normal pulses.     Heart sounds: Normal heart sounds.  Pulmonary:     Effort: Pulmonary effort is normal.     Breath sounds: Normal breath sounds.  Chest:     Chest wall: Tenderness (Tenderness to left posterior upper back and left lateral chest wall on palpation without any bruising crepitus, or emphysema appreciated.) present.  Abdominal:     Palpations: Abdomen is soft.     Tenderness: There is no abdominal tenderness.  Musculoskeletal:     Cervical back: Neck supple.     Comments: No tenderness to all 4 extremities.  Skin:    Findings: No rash.  Neurological:     Mental Status: He is alert and oriented to person, place, and time.  Psychiatric:        Mood and Affect: Mood normal.     ED Results / Procedures / Treatments   Labs (all labs ordered are listed, but only abnormal results are displayed) Labs Reviewed  CBC WITH DIFFERENTIAL/PLATELET - Abnormal; Notable for the following components:      Result Value   WBC 14.5 (*)    Neutro Abs 11.6 (*)    Abs Immature Granulocytes 0.08  (*)    All other components within normal limits  I-STAT CHEM 8, ED - Abnormal; Notable for the following components:   BUN 24 (*)    Creatinine, Ser 1.30 (*)    Glucose, Bld 103 (*)    Calcium, Ion 1.12 (*)    All other components within normal limits  RESPIRATORY PANEL BY RT PCR (FLU A&B, COVID)    EKG None  Radiology CT Chest Wo Contrast  Result Date: 08/11/2020 CLINICAL DATA:  Left rib pain after fall down steps. EXAM: CT CHEST WITHOUT CONTRAST TECHNIQUE: Multidetector CT imaging of the chest was performed following the standard protocol without IV contrast. COMPARISON:  July 20, 2006. FINDINGS: Cardiovascular: Atherosclerosis of thoracic aorta is noted without aneurysm formation. Normal cardiac size. No pericardial effusion is noted. Mild coronary artery calcifications are noted. Mediastinum/Nodes: No enlarged mediastinal or axillary lymph nodes. Thyroid gland, trachea, and esophagus demonstrate no significant findings. Lungs/Pleura: No pneumothorax is noted. Right lung is clear. Mild left basilar subsegmental atelectasis is noted. Minimal left pleural effusion may be present. Upper Abdomen: No acute abnormality. Musculoskeletal: Moderately displaced fracture is seen involving the posterior portion of the left tenth rib. The posterior and lateral portions of the left eighth and ninth ribs are broken in at least 2 places in each rib with the more posterior fractures being moderately displaced. IMPRESSION: 1. Moderately displaced fracture is seen involving the posterior portion of the left tenth rib. The posterior and lateral portions of the left eighth and ninth ribs are broken in at least 2 places in each rib with the more posterior fractures being moderately displaced. Mild left basilar subsegmental atelectasis is noted. Minimal left pleural effusion may be present. 2. Mild coronary artery calcifications are noted suggesting coronary artery disease. Aortic Atherosclerosis (ICD10-I70.0).  Electronically Signed   By: Sabino Dick  Jr M.D.   On: 08/11/2020 11:25    Procedures Procedures (including critical care time)  Medications Ordered in ED Medications  acetaminophen (TYLENOL) tablet 1,000 mg (1,000 mg Oral Given 08/11/20 1029)  morphine 4 MG/ML injection 4 mg (4 mg Intravenous Given 08/11/20 1141)  morphine 4 MG/ML injection 6 mg (6 mg Intravenous Given 08/11/20 1252)    ED Course  I have reviewed the triage vital signs and the nursing notes.  Pertinent labs & imaging results that were available during my care of the patient were reviewed by me and considered in my medical decision making (see chart for details).    MDM Rules/Calculators/A&P                          BP 129/71 (BP Location: Right Arm)   Pulse 72   Temp 97.9 F (36.6 C) (Oral)   Resp 16   Ht 5\' 8"  (1.727 m)   Wt 74.8 kg   SpO2 94%   BMI 25.09 kg/m   Final Clinical Impression(s) / ED Diagnoses Final diagnoses:  Closed traumatic displaced fracture of three ribs, initial encounter  Fall down stairs, initial encounter    Rx / DC Orders ED Discharge Orders    None     10:26 AM Patient had mechanical fall earlier today striking his left side of chest against the steps.  He is having pain when he takes a deep breath as well as reproducible left-sided chest wall pain and back pain.  Will obtain chest CT to assess for potential rib fracture.  No significant midline spine tenderness on palpation.  No signs of head trauma.  Tylenol given for pain. Care discussed with Dr. Billy Fischer  11:37 AM Chest CT demonstrate moderate displaced fracture involving the posterior portions of the left 10th rib.  The posterior and lateral portions of the left eighth and ninth ribs are broken in at least 2 places and is moderately displaced.  Mild left basilar subsegmental atelectasis is noted, and minimal left pleural effusion may be present.  In the significant setting of displaced rib fractures, patient likely would  benefit from admission for pulmonary toilet and pain control.  Incentive spirometer provided additional pain medication given, will check basic labs  12:48 PM Appreciate consultation from trauma specialist who agrees to see and likely admit patient for further care.  We will continue with pain management.  Incentive spirometer provided.   Domenic Moras, PA-C 08/11/20 1303    Gareth Morgan, MD 08/11/20 [phone]

## 2020-08-11 NOTE — H&P (Signed)
Hayden Rose May 22, 1951  [id number].    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  His care is through the [facility].  He has some neck pain and back pain with DDD at baseline.  It is unclear if some of this neuropathy is from disc issues versus other etiology.  He does not have DM.    This morning he was coming down the steps in his socks and fells hitting his left side.  He denies hitting his head or LOC.  Nothing else hurts currently except his baseline problems which he can't really feel currently due to his rib pain.  He admits to some SOB secondary to pain.  He is very active at baseline clearing land, etc.  He denies CP, SOB, or other issues doing his ADLs except chronic joint pain.  He does smoke hemp daily to help with this pain.  He ambulated at his house but came to the ED for further evaluation secondary to his pain.  He underwent a CT scan of his chest that revealed moderately displaced fracture of the posterior portion of the left 10th rib.  He has segmental fractures of the 8th and 9th ribs with moderate displacement as well.  He has no other imaging.  He is having a fair amount of pain and not wanting to take deep breathes.  We have been asked to admit him for pain control and mobilization.  ROS: ROS: Please see HPI, otherwise all other systems have been reviewed and are negative, except MSK system.  Most joints are positive for chronic pain along with neuropathy in his hands and feet.  Family History  Problem Relation Age of Onset  . Coronary artery disease Mother   . Heart attack Mother        stents in neck and heart ?  Marland Kitchen Crohn's disease Mother   . Cirrhosis Father   . Cirrhosis Sister   . Prostate cancer Maternal Uncle   . Liver cancer  Cousin   . Colitis Daughter   . Diabetes Maternal Uncle   . Diabetes Brother   . Kidney disease Maternal Aunt   . Kidney disease Cousin     Past Medical History:  Diagnosis Date  . Anxiety   . Arthritis   . Back pain    chronic  . Chronic kidney disease    stage III  renal disease   . Degenerative disc disease   . Depression   . Dizziness   . Dupuytren's contracture of right hand   . Dyspnea   . Elevated LFTs   . Erectile dysfunction   . Hypothyroidism   . Jaundice with stoppage of bile flow    after stent romoved  . Lewy body dementia ([place])   . Memory deficits 04/23/2013  . Orthostatic hypotension   . Osgood-Schlatter's disease   . Restless leg syndrome 04/23/2013  . Skin cancer    basal cell on back x2  . Sleep apnea with use of continuous positive airway pressure (CPAP)   . Tachycardia    heart monitor last week for 48 hours no report yet    Past Surgical History:  Procedure Laterality Date  . APPENDECTOMY    . BASAL CELL CARCINOMA EXCISION  [id number]  . CARPAL TUNNEL RELEASE  2003  . CERVICAL FUSION  1985  . CHOLECYSTECTOMY    . GALLBLADDER SURGERY  2007-2008  . LIVER BIOPSY  12/27/2011   Procedure: LIVER BIOPSY;  Surgeon: Inda Castle, MD;  Location: [facility];  Service: Endoscopy;  Laterality: N/A;  pt moved from 3/22 to 3/18 ( aw)  . LUMBAR FUSION  1996  . TONSILLECTOMY      Social History:  reports that he quit smoking about 44 years ago. He has never used smokeless tobacco. He reports that he does not drink alcohol and does not use drugs.  Allergies:  Allergies  Allergen Reactions  . Vesicare [Solifenacin Succinate] Anaphylaxis  . Sulfa Antibiotics Swelling  . Aricept [Donepezil Hydrochloride] Nausea And Vomiting and Other (See Comments)  . Gabapentin   . Lamictal [Lamotrigine]     [place]  . Namenda [Memantine Hcl] Other (See Comments)    unknown  . Sulfur Swelling  . Exelon [Rivastigmine] Rash    patch  . Fentanyl Rash    patch  .  Penicillins Rash        (Not in a hospital admission)    Physical Exam: Blood pressure 129/71, pulse 72, temperature 97.9 F (36.6 C), temperature source Oral, resp. rate 16, height 5\' 8"  (1.727 m), weight 74.8 kg, SpO2 94 %. General: pleasant, WD, WN white male who is sitting up in a chair in moderate distress secondary to pain HEENT: head is normocephalic, atraumatic.  Sclera are noninjected.  PERRL.  Ears and nose without any masses or lesions.  Mouth is pink and moist Neck: normal ROM, no midline tenderness, trachea midline Heart: regular, rate, and rhythm.  Normal s1,s2. No obvious murmurs, gallops, or rubs noted.  Palpable radial and pedal pulses bilaterally Lungs: CTAB, no wheezes, rhonchi, or rales noted.  Respiratory effort is somewhat labored secondary to pain.  He has significant posterior left chest wall pain.  No ecchymosis or external signs of trauma noted Abd: soft, NT, ND, +BS, no masses, hernias, or organomegaly MS: all 4 extremities are symmetrical with no cyanosis, clubbing, or edema.  All with normal ROM except LUE with limited shoulder forward flexion secondary to pain in his posterior chest wall.  No pain in BUE and BLEs.  No other signs of ecchymosis, edema, or trauma.  No midline back pain, no stepoffs etc. Skin: warm and dry with no masses, lesions, or rashes Neuro: Cranial nerves 2-12 grossly intact, sensation is normal throughout Psych: A&Ox3 with an appropriate affect.   Results for orders placed or performed during the hospital encounter of 08/11/20 (from the past 48 hour(s))  CBC with Differential/Platelet     Status: Abnormal   Collection Time: 08/11/20 11:37 AM  Result Value Ref Range   WBC 14.5 (H) 4.0 - 10.5 K/uL   RBC 4.71 4.22 - 5.81 MIL/uL   Hemoglobin 14.3 13.0 - 17.0 g/dL   HCT 42.5 39 - 52 %   MCV 90.2 80.0 - 100.0 fL   MCH 30.4 26.0 - 34.0 pg   MCHC 33.6 30.0 - 36.0 g/dL   RDW 12.5 11.5 - 15.5 %   Platelets 185 150 - 400 K/uL   nRBC 0.0 0.0  - 0.2 %   Neutrophils Relative % 79 %   Neutro Abs 11.6 (H) 1.7 - 7.7 K/uL   Lymphocytes Relative 11 %   Lymphs Abs 1.5 0.7 - 4.0 K/uL   Monocytes Relative 7 %   Monocytes Absolute 1.0 0.1 -  1.0 K/uL   Eosinophils Relative 1 %   Eosinophils Absolute 0.2 0.0 - 0.5 K/uL   Basophils Relative 1 %   Basophils Absolute 0.1 0.0 - 0.1 K/uL   Immature Granulocytes 1 %   Abs Immature Granulocytes 0.08 (H) 0.00 - 0.07 K/uL    Comment: Performed at [facility] [address] [place], [place] [address]  I-stat chem 8, ED (not at [facility] or [facility])     Status: Abnormal   Collection Time: 08/11/20 12:38 PM  Result Value Ref Range   Sodium 137 135 - 145 mmol/L   Potassium 4.3 3.5 - 5.1 mmol/L   Chloride 99 98 - 111 mmol/L   BUN 24 (H) 8 - 23 mg/dL   Creatinine, Ser 1.30 (H) 0.61 - 1.24 mg/dL   Glucose, Bld 103 (H) 70 - 99 mg/dL    Comment: Glucose reference range applies only to samples taken after fasting for at least 8 hours.   Calcium, Ion 1.12 (L) 1.15 - 1.40 mmol/L   TCO2 31 22 - 32 mmol/L   Hemoglobin 13.6 13.0 - 17.0 g/dL   HCT 40.0 39 - 52 %   CT Chest Wo Contrast  Result Date: 08/11/2020 CLINICAL DATA:  Left rib pain after fall down steps. EXAM: CT CHEST WITHOUT CONTRAST TECHNIQUE: Multidetector CT imaging of the chest was performed following the standard protocol without IV contrast. COMPARISON:  July 20, 2006. FINDINGS: Cardiovascular: Atherosclerosis of thoracic aorta is noted without aneurysm formation. Normal cardiac size. No pericardial effusion is noted. Mild coronary artery calcifications are noted. Mediastinum/Nodes: No enlarged mediastinal or axillary lymph nodes. Thyroid gland, trachea, and esophagus demonstrate no significant findings. Lungs/Pleura: No pneumothorax is noted. Right lung is clear. Mild left basilar subsegmental atelectasis is noted. Minimal left pleural effusion may be present. Upper Abdomen: No acute abnormality. Musculoskeletal: Moderately displaced fracture  is seen involving the posterior portion of the left tenth rib. The posterior and lateral portions of the left eighth and ninth ribs are broken in at least 2 places in each rib with the more posterior fractures being moderately displaced. IMPRESSION: 1. Moderately displaced fracture is seen involving the posterior portion of the left tenth rib. The posterior and lateral portions of the left eighth and ninth ribs are broken in at least 2 places in each rib with the more posterior fractures being moderately displaced. Mild left basilar subsegmental atelectasis is noted. Minimal left pleural effusion may be present. 2. Mild coronary artery calcifications are noted suggesting coronary artery disease. Aortic Atherosclerosis (ICD10-I70.0). Electronically Signed   By: Marijo Conception M.D.   On: 08/11/2020 11:25      Assessment/Plan Fall down stairs Left 8, 9, 10 rib fractures - 8,9 segmental with some displacement.  10 with moderate displacement.  pulm toilet, pain control, IS, PT/OT, ice/heat prn CKD stage II - I stat shows cr of 1.3.  Will order CMET for baseline lab evaluation.  Asking about toradol.  Will probably hold on this for now given his baseline CKD HTN - resume home meds HLD - resume home meds Hypothyroidism - resume home meds Lewy-body dementia - resume home meds Neuropathy - start home Lyrica.  Unable to take gabapentin due to hallucinations Chronic polyarthralgia - mobilization.  Pain control as able.  Patient stopped taking narcotics several years and ago and smokes hemp daily now pain control.  We discussed the need for use of narcotics acutely for pain control to help him move and breath to prevent  respiratory issues such as effusions, PNA, etc.   FEN - CLD, ADAT, IVFs at 50cc  VTE - Lovenox ID - none acutely needed Admit - observation  Henreitta Cea, [facility] Surgery 08/11/2020, 2:05 PM Please see Amion for pager number during day hours 7:00am-4:30pm or 7:00am  -11:30am on weekends

## 2020-08-11 NOTE — ED Notes (Signed)
Jerene Pitch, daughter, would like to be called if need anything [phone]

## 2020-08-11 NOTE — ED Notes (Signed)
States he is feeling a little better, rates pain 7/10 if he doesn't move much worse if he moves. Currently sitting in a chair ,which is his position of comfort. Wife at bedside.

## 2020-08-11 NOTE — ED Notes (Signed)
Claiborne Billings PA with trauma at bedside.

## 2020-08-11 NOTE — ED Triage Notes (Signed)
Patient presents to ed via [facility] states he was coming down his stairs only had his socks on and slipped down 2 steps and caught himself , landing on his left side hitting his side on the step. Denies loc States pain in much worse with inspiration and movement

## 2020-08-11 NOTE — ED Notes (Signed)
Pt continues to be folded over in pain. MD page.

## 2020-08-11 NOTE — ED Notes (Signed)
Dinner Tray Ordered @ 1704. 

## 2020-08-12 ENCOUNTER — Other Ambulatory Visit: Payer: Self-pay

## 2020-08-12 ENCOUNTER — Encounter (HOSPITAL_COMMUNITY): Payer: Self-pay

## 2020-08-12 ENCOUNTER — Observation Stay (HOSPITAL_COMMUNITY): Payer: No Typology Code available for payment source

## 2020-08-12 DIAGNOSIS — W19XXXA Unspecified fall, initial encounter: Secondary | ICD-10-CM

## 2020-08-12 DIAGNOSIS — Y92009 Unspecified place in unspecified non-institutional (private) residence as the place of occurrence of the external cause: Secondary | ICD-10-CM

## 2020-08-12 HISTORY — DX: Unspecified place in unspecified non-institutional (private) residence as the place of occurrence of the external cause: Y92.009

## 2020-08-12 HISTORY — DX: Unspecified fall, initial encounter: W19.XXXA

## 2020-08-12 LAB — CBC
HCT: 37.5 % — ABNORMAL LOW (ref 39.0–52.0)
Hemoglobin: 12.6 g/dL — ABNORMAL LOW (ref 13.0–17.0)
MCH: 30.8 pg (ref 26.0–34.0)
MCHC: 33.6 g/dL (ref 30.0–36.0)
MCV: 91.7 fL (ref 80.0–100.0)
Platelets: 148 10*3/uL — ABNORMAL LOW (ref 150–400)
RBC: 4.09 MIL/uL — ABNORMAL LOW (ref 4.22–5.81)
RDW: 12.6 % (ref 11.5–15.5)
WBC: 9.4 10*3/uL (ref 4.0–10.5)
nRBC: 0 % (ref 0.0–0.2)

## 2020-08-12 LAB — BASIC METABOLIC PANEL
Anion gap: 9 (ref 5–15)
BUN: 18 mg/dL (ref 8–23)
CO2: 29 mmol/L (ref 22–32)
Calcium: 9 mg/dL (ref 8.9–10.3)
Chloride: 96 mmol/L — ABNORMAL LOW (ref 98–111)
Creatinine, Ser: 1.44 mg/dL — ABNORMAL HIGH (ref 0.61–1.24)
GFR, Estimated: 53 mL/min — ABNORMAL LOW (ref >60)
Glucose, Bld: 122 mg/dL — ABNORMAL HIGH (ref 70–99)
Potassium: 4.1 mmol/L (ref 3.5–5.1)
Sodium: 134 mmol/L — ABNORMAL LOW (ref 135–145)

## 2020-08-12 MED ORDER — TRAMADOL HCL 50 MG PO TABS
50.0000 mg | ORAL_TABLET | Freq: Four times a day (QID) | ORAL | Status: DC
  Administered 2020-08-12 – 2020-08-13 (×4): 50 mg via ORAL
  Filled 2020-08-12 (×4): qty 1

## 2020-08-12 MED ORDER — LIDOCAINE 5 % EX PTCH
1.0000 | MEDICATED_PATCH | Freq: Every day | CUTANEOUS | Status: DC
  Administered 2020-08-12 – 2020-08-14 (×3): 1 via TRANSDERMAL
  Filled 2020-08-12 (×2): qty 1

## 2020-08-12 MED ORDER — GUAIFENESIN 100 MG/5ML PO SOLN
15.0000 mL | ORAL | Status: DC | PRN
  Administered 2020-08-12 – 2020-08-13 (×3): 300 mg via ORAL
  Filled 2020-08-12 (×6): qty 15

## 2020-08-12 MED ORDER — ACETAMINOPHEN 500 MG PO TABS
1000.0000 mg | ORAL_TABLET | Freq: Four times a day (QID) | ORAL | Status: DC
  Administered 2020-08-12 – 2020-08-14 (×8): 1000 mg via ORAL
  Filled 2020-08-12 (×8): qty 2

## 2020-08-12 MED ORDER — METHOCARBAMOL 500 MG PO TABS
1000.0000 mg | ORAL_TABLET | Freq: Four times a day (QID) | ORAL | Status: DC
  Administered 2020-08-12 – 2020-08-14 (×9): 1000 mg via ORAL
  Filled 2020-08-12 (×9): qty 2

## 2020-08-12 MED ORDER — INFLUENZA VAC A&B SA ADJ QUAD 0.5 ML IM PRSY
0.5000 mL | PREFILLED_SYRINGE | INTRAMUSCULAR | Status: AC
  Administered 2020-08-14: 0.5 mL via INTRAMUSCULAR
  Filled 2020-08-12: qty 0.5

## 2020-08-12 NOTE — Progress Notes (Addendum)
[facility] Progress Note     Subjective: CC:  C/o left posterior rib pain. Pain with movement and inspiration. Was able to rest a little after he laid back in an upright chair. Denies SOB. Used IS yesterday but has been in too much pain this AM to use it. Voiding without difficulty - states he doesn't think his IVF were running until this AM (RN confirmed they were not running when she came in at 0300). He is unsure what his baseline kidney function is.   Objective: Vital signs in last 24 hours: Temp:  [97.9 F (36.6 C)-98 F (36.7 C)] 98 F (36.7 C) (11/01 1620) Pulse Rate:  [49-116] 52 (11/02 0615) Resp:  [12-21] 15 (11/02 0615) BP: (105-168)/(56-78) 119/61 (11/02 0615) SpO2:  [90 %-99 %] 95 % (11/02 0615) Weight:  [74.8 kg] 74.8 kg (11/01 1014)    Intake/Output from previous day: 11/01 0701 - 11/02 0700 In: -  Out: 600 [Urine:600] Intake/Output this shift: No intake/output data recorded.  PE: Gen:  Alert, pleasant and cooperative, appears uncomfortable. Card:  Regular rate and rhythm, pedal pulses 2+ BL Pulm:  Normal effort, clear to auscultation bilaterally, appropriately tender left posterior chest wall Abd: Soft, non-tender, non-distended, bowel sounds present in all 4 quadrants, no HSM Skin: warm and dry, no rashes  Psych: A&Ox3   Lab Results:  Recent Labs    08/11/20 1137 08/11/20 1137 08/11/20 1238 08/12/20 0404  WBC 14.5*  --   --  9.4  HGB 14.3   < > 13.6 12.6*  HCT 42.5   < > 40.0 37.5*  PLT 185  --   --  148*   < > = values in this interval not displayed.   BMET Recent Labs    08/11/20 1700 08/12/20 0404  NA 136 134*  K 3.9 4.1  CL 97* 96*  CO2 31 29  GLUCOSE 140* 122*  BUN 20 18  CREATININE 1.35* 1.44*  CALCIUM 9.2 9.0   PT/INR No results for input(s): LABPROT, INR in the last 72 hours. CMP     Component Value Date/Time   NA 134 (L) 08/12/2020 0404   K 4.1 08/12/2020 0404   CL 96 (L) 08/12/2020 0404   CO2 29 08/12/2020  0404   GLUCOSE 122 (H) 08/12/2020 0404   GLUCOSE 101 (H) 10/18/2006 0926   BUN 18 08/12/2020 0404   CREATININE 1.44 (H) 08/12/2020 0404   CALCIUM 9.0 08/12/2020 0404   CALCIUM 9.7 12/08/2011 1052   PROT 6.4 (L) 08/11/2020 1700   ALBUMIN 4.1 08/11/2020 1700   AST 25 08/11/2020 1700   ALT 15 08/11/2020 1700   ALKPHOS 48 08/11/2020 1700   BILITOT 0.7 08/11/2020 1700   GFRNONAA 53 (L) 08/12/2020 0404   GFRAA 36 (L) 03/24/2018 1536   Lipase  No results found for: LIPASE     Studies/Results: DG Pelvis 1-2 Views  Result Date: 08/11/2020 CLINICAL DATA:  Pelvic pain, fell yesterday EXAM: PELVIS - 1-2 VIEW COMPARISON:  08/03/2007 FINDINGS: Supine frontal view of the pelvis demonstrates no acute displaced fracture. The hips are well aligned. Sacroiliac joints are normal. Mild spondylosis at the lumbosacral junction. Soft tissues are normal. IMPRESSION: 1. No acute pelvic fracture. Electronically Signed   By: Randa Ngo M.D.   On: 08/11/2020 15:08   CT Chest Wo Contrast  Result Date: 08/11/2020 CLINICAL DATA:  Left rib pain after fall down steps. EXAM: CT CHEST WITHOUT CONTRAST TECHNIQUE: Multidetector CT imaging of the chest was  performed following the standard protocol without IV contrast. COMPARISON:  July 20, 2006. FINDINGS: Cardiovascular: Atherosclerosis of thoracic aorta is noted without aneurysm formation. Normal cardiac size. No pericardial effusion is noted. Mild coronary artery calcifications are noted. Mediastinum/Nodes: No enlarged mediastinal or axillary lymph nodes. Thyroid gland, trachea, and esophagus demonstrate no significant findings. Lungs/Pleura: No pneumothorax is noted. Right lung is clear. Mild left basilar subsegmental atelectasis is noted. Minimal left pleural effusion may be present. Upper Abdomen: No acute abnormality. Musculoskeletal: Moderately displaced fracture is seen involving the posterior portion of the left tenth rib. The posterior and lateral portions of  the left eighth and ninth ribs are broken in at least 2 places in each rib with the more posterior fractures being moderately displaced. IMPRESSION: 1. Moderately displaced fracture is seen involving the posterior portion of the left tenth rib. The posterior and lateral portions of the left eighth and ninth ribs are broken in at least 2 places in each rib with the more posterior fractures being moderately displaced. Mild left basilar subsegmental atelectasis is noted. Minimal left pleural effusion may be present. 2. Mild coronary artery calcifications are noted suggesting coronary artery disease. Aortic Atherosclerosis (ICD10-I70.0). Electronically Signed   By: Marijo Conception M.D.   On: 08/11/2020 11:25    Anti-infectives: Anti-infectives (From admission, onward)   None     Assessment/Plan Fall down stairs Left 8, 9, 10 rib fractures - 8,9 segmental with some displacement.  10 with moderate displacement.  pulm toilet, pain control, IS, PT/OT, ice/heat prn CKD stage II - Cr 1.44 from 1.35. D/C toradol. Increase IVF to 75 cc/hr. Re-check BMP in AM. HTN - home meds HLD - home meds Hypothyroidism - home synthroid.  Lewy-body dementia - home meds Neuropathy - home Lyrica.  Unable to take gabapentin due to hallucinations Chronic polyarthralgia - mobilization.  Pain control as able.  Patient stopped taking narcotics several years and ago and smokes hemp daily now pain control.  VTE - Lovenox FEN - adv to regular diet, continue IVF given AKI on CKD  ID - none acutely needed Admit - observation, PT/OT today  Increase pain control - increase scheduled APAP and robaxin, continue lidoderm, add scheduled tramadol, continue PRN oxycodone and HM.   LOS: 0 days    Obie Dredge, [facility] Please see Amion for pager number during day hours 7:00am-4:30pm

## 2020-08-12 NOTE — ED Notes (Signed)
Lunch Tray Ordered @ 1009. 

## 2020-08-12 NOTE — Evaluation (Signed)
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  Pt admitted s/p fall down stairs with L 8-10 rib fx (10 with moderate displacement)  Clinical Impression  Pt admitted with above diagnosis. Pt presenting with decreased mobility, endurance, strength, balance, and safety.  Pt with increased pain due to rib fractures.  Pt normally very independent and reports he pushes himself.  He has good family support.  Has significant amount of DME but not a RW.  Spent increased time with education on transfer techniques, pain control , safety, and use of IS.  Pt is very motivated.  Will benefit from acute PT services to continue to address mobility, pain control, and safety awareness.  Pt currently with functional limitations due to the deficits listed below (see PT Problem List). Pt will benefit from skilled PT to increase their independence and safety with mobility to allow discharge to the venue listed below.       Follow Up Recommendations No PT follow up    Equipment Recommendations  Rolling walker with 5" wheels    Recommendations for Other Services       Precautions / Restrictions Precautions Precautions: None      Mobility  Bed Mobility               General bed mobility comments: sleeps in recliner - advised to continue due to rib fractures    Transfers Overall transfer level: Needs assistance Equipment used: None Transfers: Sit to/from Stand Sit to Stand: Supervision         General transfer comment: cued to have L hand on RW and push up with R hand - reported this helped  pain  Ambulation/Gait Ambulation/Gait assistance: Supervision Gait Distance (Feet): 125 Feet Assistive device: Rolling walker (2 wheeled) Gait Pattern/deviations: Step-through pattern;Decreased stride length Gait velocity: decreased   General Gait Details: min cues for RW use - advised to use RW at home as it assisted with pain control and provided increased stability  Stairs            Wheelchair Mobility    Modified Rankin (Stroke Patients Only)       Balance Overall balance assessment: Needs assistance Sitting-balance support: No upper extremity supported Sitting balance-Leahy Scale: Good       Standing balance-Leahy Scale: Fair Standing balance comment: Able to stand without AD, used RW for pain control with ambulation, did not challenge balance due to pain                             Pertinent Vitals/Pain Pain Assessment: 0-10 Pain Score: 7  Pain Location: L ribs Pain Descriptors / Indicators: Discomfort;Sore;Sharp Pain Intervention(s): Limited activity within patient's tolerance;Monitored during session;Premedicated before session    Home Living Family/patient expects to be discharged to:: Private residence Living Arrangements: Spouse/significant other Available Help at Discharge: Family;Available 24 hours/day Type of Home: House Home Access: Stairs to enter Entrance Stairs-Rails: Right Entrance Stairs-Number of Steps: 3 Home Layout: Two level;Able to live on main level with bedroom/bathroom;Other (Comment) (sleeps in recliner on first floor) Home Equipment: Bedside  commode;Hospital bed;Wheelchair - Insurance claims handler - standard      Prior Function Level of Independence: Independent         Comments: Does yard work and garage work     Journalist, newspaper        Extremity/Trunk Assessment   Upper Extremity Assessment Upper Extremity Assessment: Overall WFL for tasks assessed (Did not MMT due to rib pain, and limited L shoulder  elevation due to pain)    Lower Extremity Assessment Lower Extremity Assessment: Overall WFL for tasks assessed (Did not MMT due to rib pain)    Cervical / Trunk Assessment Cervical / Trunk Assessment: Normal  Communication   Communication: No difficulties  Cognition Arousal/Alertness: Awake/alert Behavior During Therapy: WFL for tasks assessed/performed Overall Cognitive Status: Within Functional Limits for tasks assessed                                 General Comments: Very pleasant, required some cues to focus on task at hand, wife present - reports pt likes to push himself too much (pt agreed)      General Comments General comments (skin integrity, edema, etc.): Pt educated on IS use and demonstrated up to 2000 mL (required cues for correct technique).  Also educated on holding pillow with coughing/sneezing for pain control, use of walker, transfer techniques, and frequent short bouts of activity.  Pt and wife report pt has the habit of pushing himself - discussed safety, working within pain parameters, and short bouts of activity.  Also, discussed having help as needed and possible use of ADL kit (has OT order)    Exercises     Assessment/Plan    PT Assessment Patient needs continued PT services  PT Problem List Decreased mobility;Decreased safety awareness;Decreased activity tolerance;Cardiopulmonary status limiting activity;Decreased knowledge of use of DME;Decreased balance;Pain       PT Treatment Interventions DME instruction;Therapeutic activities;Gait training;Therapeutic exercise;Patient/family education;Modalities;Stair training;Balance training;Functional mobility training    PT Goals (Current goals can be found in the Care Plan section)  Acute Rehab PT Goals Patient Stated Goal: return home, decrease pain, not get PNE PT Goal Formulation: With patient Time For Goal Achievement: 08/26/20 Potential to Achieve Goals: Good    Frequency Min 3X/week    Barriers to discharge        Co-evaluation               AM-PAC PT "6 Clicks" Mobility  Outcome Measure Help needed turning from your back to your side while in a flat bed without using bedrails?: A Lot Help needed moving from lying on your back to sitting on the side of a flat bed without using bedrails?: A Lot Help needed moving to and from a bed to a chair (including a wheelchair)?: None Help needed standing up from a chair using your arms (e.g., wheelchair or bedside chair)?: None Help needed to walk in hospital room?: None Help needed climbing 3-5 steps with a railing? : A Little 6 Click Score: 19    End of Session   Activity Tolerance: Patient tolerated treatment well Patient left: in chair;with call bell/phone within reach;with family/visitor present Nurse Communication: Mobility status;Other (comment) (safe to ambulate with family ; or in room with RW) PT Visit Diagnosis: Other abnormalities of gait and mobility (R26.89);Pain Pain - Right/Left: Left Pain - part of body:  (ribs/trunk)    Time: 0768-0881 PT Time Calculation (min) (ACUTE ONLY): 33 min  Charges:   PT Evaluation $PT Eval Low Complexity: 1 Low PT Treatments $Therapeutic Activity: 8-22 mins        Abran Richard, PT Acute Rehab Services Pager [phone] [facility] 08/12/2020, 3:57 PM

## 2020-08-12 NOTE — ED Notes (Signed)
Attempted report x1. 

## 2020-08-13 DIAGNOSIS — Z800 Family history of malignant neoplasm of digestive organs: Secondary | ICD-10-CM | POA: Diagnosis not present

## 2020-08-13 DIAGNOSIS — Z20822 Contact with and (suspected) exposure to covid-19: Secondary | ICD-10-CM | POA: Diagnosis present

## 2020-08-13 DIAGNOSIS — N182 Chronic kidney disease, stage 2 (mild): Secondary | ICD-10-CM | POA: Diagnosis present

## 2020-08-13 DIAGNOSIS — I129 Hypertensive chronic kidney disease with stage 1 through stage 4 chronic kidney disease, or unspecified chronic kidney disease: Secondary | ICD-10-CM | POA: Diagnosis present

## 2020-08-13 DIAGNOSIS — Z981 Arthrodesis status: Secondary | ICD-10-CM | POA: Diagnosis not present

## 2020-08-13 DIAGNOSIS — Z23 Encounter for immunization: Secondary | ICD-10-CM | POA: Diagnosis not present

## 2020-08-13 DIAGNOSIS — W109XXA Fall (on) (from) unspecified stairs and steps, initial encounter: Secondary | ICD-10-CM | POA: Diagnosis present

## 2020-08-13 DIAGNOSIS — G629 Polyneuropathy, unspecified: Secondary | ICD-10-CM | POA: Diagnosis present

## 2020-08-13 DIAGNOSIS — Z8042 Family history of malignant neoplasm of prostate: Secondary | ICD-10-CM | POA: Diagnosis not present

## 2020-08-13 DIAGNOSIS — Y9301 Activity, walking, marching and hiking: Secondary | ICD-10-CM | POA: Diagnosis present

## 2020-08-13 DIAGNOSIS — F0391 Unspecified dementia with behavioral disturbance: Secondary | ICD-10-CM | POA: Diagnosis present

## 2020-08-13 DIAGNOSIS — G2581 Restless legs syndrome: Secondary | ICD-10-CM | POA: Diagnosis present

## 2020-08-13 DIAGNOSIS — Z8249 Family history of ischemic heart disease and other diseases of the circulatory system: Secondary | ICD-10-CM | POA: Diagnosis not present

## 2020-08-13 DIAGNOSIS — Z880 Allergy status to penicillin: Secondary | ICD-10-CM | POA: Diagnosis not present

## 2020-08-13 DIAGNOSIS — E039 Hypothyroidism, unspecified: Secondary | ICD-10-CM | POA: Diagnosis present

## 2020-08-13 DIAGNOSIS — N179 Acute kidney failure, unspecified: Secondary | ICD-10-CM | POA: Diagnosis present

## 2020-08-13 DIAGNOSIS — Y92009 Unspecified place in unspecified non-institutional (private) residence as the place of occurrence of the external cause: Secondary | ICD-10-CM | POA: Diagnosis not present

## 2020-08-13 DIAGNOSIS — Z888 Allergy status to other drugs, medicaments and biological substances status: Secondary | ICD-10-CM | POA: Diagnosis not present

## 2020-08-13 DIAGNOSIS — G4730 Sleep apnea, unspecified: Secondary | ICD-10-CM | POA: Diagnosis present

## 2020-08-13 DIAGNOSIS — Z882 Allergy status to sulfonamides status: Secondary | ICD-10-CM | POA: Diagnosis not present

## 2020-08-13 DIAGNOSIS — Z85828 Personal history of other malignant neoplasm of skin: Secondary | ICD-10-CM | POA: Diagnosis not present

## 2020-08-13 DIAGNOSIS — E785 Hyperlipidemia, unspecified: Secondary | ICD-10-CM | POA: Diagnosis present

## 2020-08-13 DIAGNOSIS — S2242XA Multiple fractures of ribs, left side, initial encounter for closed fracture: Secondary | ICD-10-CM | POA: Diagnosis present

## 2020-08-13 DIAGNOSIS — F419 Anxiety disorder, unspecified: Secondary | ICD-10-CM | POA: Diagnosis present

## 2020-08-13 DIAGNOSIS — G3183 Dementia with Lewy bodies: Secondary | ICD-10-CM | POA: Diagnosis present

## 2020-08-13 DIAGNOSIS — Z833 Family history of diabetes mellitus: Secondary | ICD-10-CM | POA: Diagnosis not present

## 2020-08-13 DIAGNOSIS — S2249XA Multiple fractures of ribs, unspecified side, initial encounter for closed fracture: Secondary | ICD-10-CM | POA: Diagnosis present

## 2020-08-13 DIAGNOSIS — F32A Depression, unspecified: Secondary | ICD-10-CM | POA: Diagnosis present

## 2020-08-13 LAB — BASIC METABOLIC PANEL
Anion gap: 8 (ref 5–15)
BUN: 18 mg/dL (ref 8–23)
CO2: 28 mmol/L (ref 22–32)
Calcium: 8.7 mg/dL — ABNORMAL LOW (ref 8.9–10.3)
Chloride: 103 mmol/L (ref 98–111)
Creatinine, Ser: 1.33 mg/dL — ABNORMAL HIGH (ref 0.61–1.24)
GFR, Estimated: 58 mL/min — ABNORMAL LOW (ref >60)
Glucose, Bld: 104 mg/dL — ABNORMAL HIGH (ref 70–99)
Potassium: 3.8 mmol/L (ref 3.5–5.1)
Sodium: 139 mmol/L (ref 135–145)

## 2020-08-13 MED ORDER — TRAMADOL HCL 50 MG PO TABS
100.0000 mg | ORAL_TABLET | Freq: Four times a day (QID) | ORAL | Status: DC
  Administered 2020-08-13 – 2020-08-14 (×5): 100 mg via ORAL
  Filled 2020-08-13 (×5): qty 2

## 2020-08-13 MED ORDER — OXYCODONE HCL 5 MG PO TABS
10.0000 mg | ORAL_TABLET | ORAL | Status: DC | PRN
  Administered 2020-08-13: 15 mg via ORAL
  Administered 2020-08-13: 10 mg via ORAL
  Administered 2020-08-13 – 2020-08-14 (×3): 15 mg via ORAL
  Filled 2020-08-13 (×4): qty 3
  Filled 2020-08-13: qty 2

## 2020-08-13 NOTE — Discharge Summary (Addendum)
[facility] Discharge Summary   Patient ID: CALVEN GILKES MRN: [id number] DOB/AGE: Feb 05, 1951 69 y.o.  Admit date: 08/11/2020 Discharge date: 08/14/2020  Discharge Diagnosis Patient Active Problem List   Diagnosis Date Noted  . Multiple rib fractures 08/13/2020  . Multiple fractures of ribs, left side, initial encounter for closed fracture 08/11/2020  . Dementia with behavioral disturbance ([place]) 03/25/2018  . Benzodiazepine dependence ([place]) 10/02/2015  . Polypharmacy 10/02/2015  . Opioid use disorder, moderate, dependence ([place]) 10/02/2015  . Acute encephalopathy 10/01/2015  . Altered mental status 10/01/2015  . Facial droop 09/10/2015  . Renal insufficiency   . Central sleep apnea comorbid with prescribed opioid use, moderate 04/02/2014  . Memory deficits 04/23/2013  . Restless leg syndrome 04/23/2013  . Sleep apnea with use of continuous positive airway pressure (CPAP)   . Nonspecific abnormal results of liver function study 12/08/2011  . HYPOTHYROIDISM 09/19/2009  . DEGENERATIVE JOINT DISEASE 09/19/2009  . ARTHRITIS 09/19/2009  . DEGENERATIVE DISC DISEASE 09/19/2009  . NECK PAIN, CHRONIC 09/19/2009  . BACK PAIN, CHRONIC 09/19/2009  . OSGOOD SCHLATTER'S DISEASE 09/19/2009  . DIZZINESS 09/19/2009  . DYSPNEA 09/19/2009  . ANXIETY 07/18/2009  . DEPRESSION 07/18/2009  . ORTHOSTATIC HYPOTENSION 07/18/2009  . SKIN CANCER, HX OF 07/18/2009    Consultants None   Imaging: No results found.  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  His care is through the [place].  He has some neck pain and back pain with DDD at baseline.  It is unclear if some of this neuropathy is from disc issues versus other etiology.  He does not have DM.    This morning he was coming down the  steps in his socks and fells hitting his left side.  He denies hitting his head or LOC.  Nothing else hurts currently except his baseline problems which he can't really feel currently due to his rib pain.  He admits to some SOB secondary to pain.  He is very active at baseline clearing land, etc.  He denies CP, SOB, or other issues doing his ADLs except chronic joint pain.  He does smoke hemp daily to help with this pain.  He ambulated at his house but came to the ED for further evaluation secondary to his pain.  He underwent a CT scan of his chest that revealed moderately displaced fracture of the posterior portion of the left 10th rib.  He has segmental fractures of the 8th and 9th ribs with moderate displacement as well.  He has no other imaging.  He is having a fair amount of pain and not wanting to take deep breathes.  We have been asked to admit him for pain control and mobilization.  Hospital Course:  Patients was admitted for pain control and mobilization. Injuries and chronic medical conditions are listed below along with their management --  Fall down stairs Left 8, 9, 10 rib fractures - 8,9 segmental with some displacement.  10 with moderate displacement.  pulm toilet, multi-modal pain control, IS, PT/OT, ice/heat prn  CKD stage II - Creatine remained around 1.3 throughout the entirety of his admission, suspect this is about his baseline.   HTN - home meds reordered  HLD - home meds reordered  Hypothyroidism - home meds reordered  Lewy-body  dementia - home meds reordered  Neuropathy - home Lyrica reordered.  Unable to take gabapentin due to hallucinations  Chronic polyarthralgia - mobilization.  Pain control as able.  Patient stopped taking narcotics, which he states he was on in very high doses, several years and ago and smokes hemp daily now pain control.  We discussed the need for use of narcotics acutely for pain control to help him move and breath to prevent respiratory issues  such as effusions, PNA, etc.    On 08/14/20 patients vitals were stable, pain overall controlled on oral medications, tolerating PO, mobilizing and cleared by PT/OT, and felt stable for discharge home. Follow up as below.   Physical Exam: Gen:  Alert, pleasant and cooperative, Card:  Regular rate and rhythm, pedal pulses 2+ BL Pulm:  Normal effort, clear to auscultation bilaterally, appropriately tender chest wall; pulled 2,000cc on IS Abd: Soft, non-tender, non-distended, bowel sounds present in all 4 quadrants, no HSM Skin: warm and dry, no rashes  Psych: A&Ox3   Allergies as of 08/14/2020      Reactions   Vesicare [solifenacin Succinate] Anaphylaxis   Sulfa Antibiotics Swelling   Aricept [donepezil Hydrochloride] Nausea And Vomiting, Other (See Comments)   Gabapentin    Lamictal [lamotrigine]    Petechia   Namenda [memantine Hcl] Other (See Comments)   unknown   Sulfur Swelling   Exelon [rivastigmine] Rash   patch   Fentanyl Rash   patch   Penicillins Rash         Medication List    TAKE these medications   acetaminophen 500 MG tablet Commonly known as: TYLENOL Take 2 tablets (1,000 mg total) by mouth every 6 (six) hours as needed.   ALPRAZolam 1 MG tablet Commonly known as: XANAX Take 1 mg by mouth at bedtime.   aspirin 81 MG chewable tablet Chew 1 tablet (81 mg total) by mouth daily.   atorvastatin 80 MG tablet Commonly known as: LIPITOR Take 1 tablet (80 mg total) by mouth daily at 6 PM.   carbidopa-levodopa 25-100 MG tablet Commonly known as: SINEMET IR Take 2 tablets by mouth daily.   Carboxymethylcellulose Sod PF 1 % Gel Place 1 drop into both eyes at bedtime.   colestipol 1 g tablet Commonly known as: COLESTID Take 1 g by mouth 2 (two) times daily.   diphenoxylate-atropine 2.5-0.025 MG tablet Commonly known as: LOMOTIL Take 1 tablet by mouth in the morning and at bedtime.   DULoxetine 30 MG capsule Commonly known as: CYMBALTA Take 30 mg by  mouth 2 (two) times daily.   hydrochlorothiazide 25 MG tablet Commonly known as: HYDRODIURIL Take 25 mg by mouth daily.   levothyroxine 137 MCG tablet Commonly known as: SYNTHROID Take 137 mcg by mouth daily before breakfast.   methocarbamol 500 MG tablet Commonly known as: ROBAXIN Take 2 tablets (1,000 mg total) by mouth every 6 (six) hours as needed for up to 8 days for muscle spasms.   multivitamin with minerals Tabs tablet Take 1 tablet by mouth daily.   Oxycodone HCl 10 MG Tabs Take 1.5 tablets (15 mg total) by mouth every 6 (six) hours as needed for up to 5 days for moderate pain or severe pain.   pregabalin 150 MG capsule Commonly known as: LYRICA Take 150 mg by mouth 2 (two) times daily.   PRESCRIPTION MEDICATION Take 1 capsule by mouth daily. Medication: Naltrexone   QUEtiapine 100 MG tablet Commonly known as: SEROQUEL Take 100 mg by mouth at  bedtime.   risperiDONE 0.5 MG tablet Commonly known as: RISPERDAL Take 1 tablet (0.5 mg total) by mouth 2 (two) times daily.   rOPINIRole 0.25 MG tablet Commonly known as: REQUIP Take 1 tablet (0.25 mg total) by mouth at bedtime.   terazosin 1 MG capsule Commonly known as: HYTRIN Take 1 mg by mouth at bedtime.   traMADol 50 MG tablet Commonly known as: ULTRAM Take 2 tablets (100 mg total) by mouth every 6 (six) hours as needed for up to 5 days.            Durable Medical Equipment  (From admission, onward)         Start     Ordered   08/13/20 0749  For home use only DME Walker rolling  Once       Comments: To help patient transfer and ambulate.  Physical / Occupational Therapy may change type of walker PRN.  Question Answer Comment  Walker: With 5 Inch Wheels   Patient needs a walker to treat with the following condition Rib fractures      08/13/20 0748           Follow-up Information    Clinic, [place]. Schedule an appointment as soon as possible for a visit.   Why: Dr. Gerome Sam Contact information: [place] [address] [phone]        [place] Follow up.   Why: call as needed  Contact information: [address] [address] [phone]              Signed: Obie Dredge, [facility] 08/14/2020, 8:47 AM

## 2020-08-13 NOTE — Evaluation (Signed)
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  Pt admitted s/p fall down stairs with L 8-10 rib fx (10 with moderate displacement)   Clinical Impression   Pt PTA: Pt living with spouse at home. Pt currently set-upA to modified independence overall; pt using figure 4 technique for LB ADL and pt with soreness in L sided ribs- able to brace L side with a pillow or squeezing L arm close to it. Pt tolerated well. Pt education on IS and hold breath for 1-3 seconds before exhaling.  Pt ambulating without AD with modified independence. Pain limiting ability to move quickly, but pt able to adapt movements and energy conservation technique education provided. Pt does not require continued OT skilled services. OT signing off.    Follow Up Recommendations  No OT follow up    Equipment Recommendations  None recommended by OT    Recommendations for Other Services       Precautions / Restrictions Precautions Precautions: None Restrictions Weight Bearing Restrictions: No      Mobility Bed Mobility Overal bed mobility: Modified Independent                  Transfers Overall transfer level: Modified independent Equipment used: None Transfers: Sit to/from Stand Sit to Stand: Modified independent (Device/Increase time)         General transfer comment: No need for AD for mobility; pushes IV pole. Able to ambulate without IV pole.    Balance Overall balance assessment: Needs assistance Sitting-balance support: No upper extremity supported Sitting balance-Leahy Scale: Good       Standing balance-Leahy  Scale: Fair Standing balance comment: Pt able to squat down to floor to pick up an item on the ground with minimal pain                           ADL either performed or assessed with clinical judgement   ADL Overall ADL's : Needs assistance/impaired Eating/Feeding: Set up;Sitting   Grooming: Modified independent;Standing   Upper Body Bathing: Set up;Standing   Lower Body Bathing: Supervison/ safety;Sitting/lateral leans;Sit to/from stand   Upper Body Dressing : Set up;Sitting;Standing   Lower Body Dressing: Supervision/safety;Sitting/lateral leans;Sit to/from stand Lower Body Dressing Details (indicate cue type and reason): Using figure 4 technique, soreness noted, but overall pt did not require assistance Toilet Transfer: Modified Independent   [place] and Hygiene: Set up;Sit to/from stand   Tub/ Banker: Supervision/safety;Grab Paediatric nurse Details (indicate cue type and reason): simulated in shower   General ADL Comments: Set-upA to modified independence overall; pt using figure 4 technique for LB ADL and pt with soreness in L sided ribs- abel to brace L side with a pillow or squeezing L arm close to it. Pt tolerated well.     Vision Baseline Vision/History: Wears glasses Wears Glasses: At all times Patient Visual Report: No change from baseline Vision Assessment?: No apparent visual deficits     Perception     Praxis      Pertinent Vitals/Pain Pain Assessment: 0-10 Pain Score: 7  Pain Location: L ribs Pain Descriptors / Indicators: Discomfort;Sore;Sharp Pain Intervention(s): Monitored during session     Hand Dominance Right   Extremity/Trunk Assessment Upper Extremity Assessment Upper Extremity Assessment: Overall WFL for tasks assessed;Generalized weakness;LUE deficits/detail LUE Deficits / Details: soreness with AROM, but overall, WFLs LUE: Unable to fully assess due to pain LUE Sensation: WNL LUE  Coordination: WNL   Lower Extremity Assessment Lower Extremity Assessment: Overall WFL for tasks assessed   Cervical / Trunk Assessment Cervical / Trunk Assessment: Normal   Communication Communication Communication: No difficulties   Cognition Arousal/Alertness: Awake/alert Behavior During Therapy: WFL for tasks assessed/performed Overall Cognitive Status: Within Functional Limits for tasks assessed                                 General Comments: Pt able to follow all commands, no memory deficits noted with challenges   General Comments  Pt education on IS and hold breath for 1-3 seconds before exhaling.     Exercises     Shoulder Instructions      Home Living Family/patient expects to be discharged to:: Private residence Living Arrangements: Spouse/significant other Available Help at Discharge: Family;Available PRN/intermittently Type of Home: House Home Access: Stairs to enter [employer] of Steps: 3 Entrance Stairs-Rails: Right Home Layout: Two level;Able to live on main level with bedroom/bathroom     Bathroom Shower/Tub: Occupational psychologist: Standard     Home Equipment: Bedside commode;Hospital bed;Wheelchair - Insurance claims handler - standard          Prior Functioning/Environment Level of Independence: Independent        Comments: Does yard work and garage work        OT Problem List: Decreased activity tolerance      OT Treatment/Interventions:      OT Goals(Current goals can be found in the care plan section) Acute Rehab OT Goals Patient Stated Goal: to go home  OT Frequency:     Barriers to D/C:            Co-evaluation              AM-PAC OT "6 Clicks" Daily Activity     Outcome Measure Help from another person eating meals?: None Help from another person taking care of personal grooming?: None Help from another person toileting, which includes using toliet, bedpan, or urinal?: A  Little Help from another person bathing (including washing, rinsing, drying)?: A Little Help from another person to put on and taking off regular upper body clothing?: None Help from another person to put on and taking off regular lower body clothing?: None 6 Click Score: 22   End of Session Nurse Communication: Mobility status  Activity Tolerance: Patient tolerated treatment well;Patient limited by pain Patient left: in chair;with call bell/phone within reach  OT Visit Diagnosis: Unsteadiness on feet (R26.81);Pain Pain - Right/Left: Left Pain - part of body:  (ribs)                Time: 1145-1200 OT Time Calculation (min): 15 min Charges:  OT General Charges $OT Visit: 1 Visit OT Evaluation $OT Eval Moderate Complexity: 1 Mod  Jefferey Pica, OTR/L Acute Rehabilitation Services Pager: [phone] Office: [phone]   Judee Hennick C 08/13/2020, 2:29 PM

## 2020-08-13 NOTE — TOC CAGE-AID Note (Signed)
Transition of Care [facility]) - CAGE-AID Screening   Patient Details  Name: Hayden Rose MRN: [id number] Date of Birth: 1951-04-18  Transition of Care [facility]) CM/SW Contact:    Emeterio Reeve, [place] Phone Number: 08/13/2020, 2:56 PM   Clinical Narrative:  CSW met with pt at bedside. CSW introduced self and explained role at the hospital.  Pt denies alcohol use. Pt denies substance use. Pt did not need any resources at this time.    CAGE-AID Screening:    Have You Ever Felt You Ought to Cut Down on Your Drinking or Drug Use?: No Have People Annoyed You By Critizing Your Drinking Or Drug Use?: No Have You Felt Bad Or Guilty About Your Drinking Or Drug Use?: No Have You Ever Had a Drink or Used Drugs First Thing In The Morning to Steady Your Nerves or to Get Rid of a Hangover?: No CAGE-AID Score: 0  Substance Abuse Education Offered: Yes    Blima Ledger, [place] Social Worker [phone]

## 2020-08-13 NOTE — Progress Notes (Signed)
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  Pt admitted s/p fall down stairs with L 8-10 rib fx (10 with moderate displacement)    PT Comments    Pt progressing well towards his physical therapy goals. Ambulating 200 feet with a walker at a supervision level; negotiated 10 steps with right railing at a min guard assist level. Trialed no AD for short distance; pt with slightly decreased gait speed and instability, so recommended walker for increased independence and pain control. Pt performing x 3 reps of IS; pulling up to ~2500 ml. Education provided regarding splinting and activity recommendations. D/c plan remains appropriate.    Follow Up Recommendations  No PT follow up     Equipment Recommendations  Rolling walker with 5" wheels    Recommendations for Other Services       Precautions / Restrictions Precautions Precautions: None Restrictions Weight Bearing Restrictions: No    Mobility  Bed Mobility               General bed mobility comments: OOB in chair  Transfers Overall transfer level: Modified independent Equipment used: None;Rolling walker (2 wheeled)                Ambulation/Gait Ambulation/Gait assistance: Supervision;Min guard Gait Distance (Feet): 200 Feet Assistive device: Rolling walker (2 wheeled);None Gait Pattern/deviations: Step-through pattern;Decreased stride length Gait velocity: decreased   General Gait Details: Cues for upward gaze, slow and steady pace, supervision for safety. Min guard provided when trialing no AD, slightly decreased gait speed  noted.   Stairs Stairs: Yes Stairs assistance: Min guard Stair Management: One rail Right Number of Stairs: 12 General stair comments: Cues for step by step pattern for pain control, min guard for stability   Wheelchair Mobility    Modified Rankin (Stroke Patients Only)       Balance Overall balance assessment: Needs assistance Sitting-balance support: No upper extremity supported Sitting balance-Leahy Scale: Good     Standing balance support: No upper extremity supported;During functional activity Standing balance-Leahy Scale: Fair                              Cognition Arousal/Alertness: Awake/alert Behavior During Therapy: WFL for tasks assessed/performed Overall Cognitive Status: Within Functional Limits for tasks assessed                                        Exercises      General Comments  VSS on RA      Pertinent Vitals/Pain Pain Assessment: 0-10 Pain Score: 7  Pain Location: L ribs Pain Descriptors / Indicators: Discomfort;Sore;Sharp Pain Intervention(s): Monitored during session;RN gave pain meds during session    Home Living Family/patient expects to be discharged to:: Private residence Living Arrangements: Spouse/significant other Available Help at Discharge: Family;Available PRN/intermittently Type of Home: House Home Access: Stairs to enter Entrance Stairs-Rails: Right Home Layout: Two level;Able to live on main level with bedroom/bathroom Home Equipment: Bedside commode;Hospital bed;Wheelchair -  manual;Shower seat;Walker - standard      Prior Function Level of Independence: Independent      Comments: Does yard work and garage work   PT Goals (current goals can now be found in the care plan section) Acute Rehab PT Goals Patient Stated Goal: to heal, stay active Potential to Achieve Goals: Good Progress towards PT goals: Progressing toward goals    Frequency    Min 3X/week      PT Plan Current  plan remains appropriate    Co-evaluation              AM-PAC PT "6 Clicks" Mobility   Outcome Measure  Help needed turning from your back to your side while in a flat bed without using bedrails?: None Help needed moving from lying on your back to sitting on the side of a flat bed without using bedrails?: None Help needed moving to and from a bed to a chair (including a wheelchair)?: None Help needed standing up from a chair using your arms (e.g., wheelchair or bedside chair)?: None Help needed to walk in hospital room?: None Help needed climbing 3-5 steps with a railing? : A Little 6 Click Score: 23    End of Session Equipment Utilized During Treatment: Gait belt Activity Tolerance: Patient tolerated treatment well Patient left: in chair;with call bell/phone within reach Nurse Communication: Mobility status PT Visit Diagnosis: Other abnormalities of gait and mobility (R26.89);Pain Pain - Right/Left: Left Pain - part of body:  (ribs)     Time: 5784-6962 PT Time Calculation (min) (ACUTE ONLY): 29 min  Charges:  $Gait Training: 8-22 mins $Therapeutic Activity: 8-22 mins                     Hayden Rose, PT, DPT Acute Rehabilitation Services Pager [phone] Office [phone]    Deno Etienne 08/13/2020, 4:07 PM

## 2020-08-13 NOTE — Progress Notes (Signed)
[facility] Progress Note     Subjective: CC:  C/o left anterior and posterior rib pain, slightly worse compared to yesterday. Pulling 1250 on IS. Denies urinary sxs. Tolerating PO. Sitting up in chair. Worked with PT yesterday who said no outpatient needs.   Objective: Vital signs in last 24 hours: Temp:  [97.5 F (36.4 C)-98.6 F (37 C)] 97.5 F (36.4 C) (11/03 0421) Pulse Rate:  [62-74] 62 (11/03 0421) Resp:  [11-18] 16 (11/03 0421) BP: (126-157)/(56-90) 157/73 (11/03 0421) SpO2:  [95 %-100 %] 99 % (11/03 0421) Last BM Date: 08/12/20  Intake/Output from previous day: 11/02 0701 - 11/03 0700 In: -  Out: 980 [Urine:980] Intake/Output this shift: No intake/output data recorded.  PE: Gen:  Alert, pleasant and cooperative, appears uncomfortable. Card:  Regular rate and rhythm, pedal pulses 2+ BL Pulm:  Normal effort, clear to auscultation bilaterally Abd: Soft, non-tender, non-distended, bowel sounds present in all 4 quadrants, no HSM Skin: warm and dry, no rashes  Psych: A&Ox3   Lab Results:  Recent Labs    08/11/20 1137 08/11/20 1137 08/11/20 1238 08/12/20 0404  WBC 14.5*  --   --  9.4  HGB 14.3   < > 13.6 12.6*  HCT 42.5   < > 40.0 37.5*  PLT 185  --   --  148*   < > = values in this interval not displayed.   BMET Recent Labs    08/12/20 0404 08/13/20 0041  NA 134* 139  K 4.1 3.8  CL 96* 103  CO2 29 28  GLUCOSE 122* 104*  BUN 18 18  CREATININE 1.44* 1.33*  CALCIUM 9.0 8.7*   PT/INR No results for input(s): LABPROT, INR in the last 72 hours. CMP     Component Value Date/Time   NA 139 08/13/2020 0041   K 3.8 08/13/2020 0041   CL 103 08/13/2020 0041   CO2 28 08/13/2020 0041   GLUCOSE 104 (H) 08/13/2020 0041   GLUCOSE 101 (H) 10/18/2006 0926   BUN 18 08/13/2020 0041   CREATININE 1.33 (H) 08/13/2020 0041   CALCIUM 8.7 (L) 08/13/2020 0041   CALCIUM 9.7 12/08/2011 1052   PROT 6.4 (L) 08/11/2020 1700   ALBUMIN 4.1 08/11/2020 1700    AST 25 08/11/2020 1700   ALT 15 08/11/2020 1700   ALKPHOS 48 08/11/2020 1700   BILITOT 0.7 08/11/2020 1700   GFRNONAA 58 (L) 08/13/2020 0041   GFRAA 36 (L) 03/24/2018 1536   Lipase  No results found for: LIPASE     Studies/Results: DG Pelvis 1-2 Views  Result Date: 08/11/2020 CLINICAL DATA:  Pelvic pain, fell yesterday EXAM: PELVIS - 1-2 VIEW COMPARISON:  08/03/2007 FINDINGS: Supine frontal view of the pelvis demonstrates no acute displaced fracture. The hips are well aligned. Sacroiliac joints are normal. Mild spondylosis at the lumbosacral junction. Soft tissues are normal. IMPRESSION: 1. No acute pelvic fracture. Electronically Signed   By: Randa Ngo M.D.   On: 08/11/2020 15:08   CT Chest Wo Contrast  Result Date: 08/11/2020 CLINICAL DATA:  Left rib pain after fall down steps. EXAM: CT CHEST WITHOUT CONTRAST TECHNIQUE: Multidetector CT imaging of the chest was performed following the standard protocol without IV contrast. COMPARISON:  July 20, 2006. FINDINGS: Cardiovascular: Atherosclerosis of thoracic aorta is noted without aneurysm formation. Normal cardiac size. No pericardial effusion is noted. Mild coronary artery calcifications are noted. Mediastinum/Nodes: No enlarged mediastinal or axillary lymph nodes. Thyroid gland, trachea, and esophagus demonstrate no significant findings. Lungs/Pleura:  No pneumothorax is noted. Right lung is clear. Mild left basilar subsegmental atelectasis is noted. Minimal left pleural effusion may be present. Upper Abdomen: No acute abnormality. Musculoskeletal: Moderately displaced fracture is seen involving the posterior portion of the left tenth rib. The posterior and lateral portions of the left eighth and ninth ribs are broken in at least 2 places in each rib with the more posterior fractures being moderately displaced. IMPRESSION: 1. Moderately displaced fracture is seen involving the posterior portion of the left tenth rib. The posterior and  lateral portions of the left eighth and ninth ribs are broken in at least 2 places in each rib with the more posterior fractures being moderately displaced. Mild left basilar subsegmental atelectasis is noted. Minimal left pleural effusion may be present. 2. Mild coronary artery calcifications are noted suggesting coronary artery disease. Aortic Atherosclerosis (ICD10-I70.0). Electronically Signed   By: Marijo Conception M.D.   On: 08/11/2020 11:25   DG Chest Port 1 View  Result Date: 08/12/2020 CLINICAL DATA:  Left rib fractures EXAM: PORTABLE CHEST 1 VIEW COMPARISON:  09/30/2015, 08/11/2020 FINDINGS: Single frontal view of the chest demonstrates an unremarkable cardiac silhouette. Patchy consolidation at the left lung base likely reflects atelectasis. No effusion or pneumothorax. Left posterolateral eighth and ninth rib fractures are again noted. No new fractures. IMPRESSION: 1. Stable left posterolateral eighth and ninth rib fractures. 2. Minimal left basilar atelectasis. Electronically Signed   By: Randa Ngo M.D.   On: 08/12/2020 23:34    Anti-infectives: Anti-infectives (From admission, onward)   None     Assessment/Plan Fall down stairs Left 8, 9, 10 rib fractures - 8,9 segmental with some displacement.  10 with moderate displacement.  pulm toilet, pain control, IS, PT/OT, ice/heat prn CKD stage II - Cr 1.44 from 1.35. D/C toradol. Increase IVF to 75 cc/hr. Re-check BMP in AM. HTN - home meds HLD - home meds Hypothyroidism - home synthroid.  Lewy-body dementia - home meds Neuropathy - home Lyrica.  Unable to take gabapentin due to hallucinations Chronic polyarthralgia - mobilization.  Pain control as able.  Patient stopped taking narcotics several years and ago and smokes hemp daily now pain control.  VTE - Lovenox FEN - adv to regular diet, continue IVF given AKI on CKD  ID - none acutely needed Admit - observation, OT today  Increase pain control - patient still required IV  dilaudid and morphine yesterday. Increase tramadol and oxy scale today, continue robaxin and tylenol and home lyrica. Possible PM discharge today after OT if pain control improves.    LOS: 0 days    Obie Dredge, [facility] Please see Amion for pager number during day hours 7:00am-4:30pm

## 2020-08-13 NOTE — Care Management (Signed)
[facility]; per Helene Kelp with [facility]:  Pt is active with [facility] for PCP PCP is Dr. Gerome Sam CSW is Cyndee Brightly, phone [phone], ext. [phone]  Reinaldo Raddle, RN, BSN  Trauma/Neuro ICU Case Manager [phone]

## 2020-08-14 DIAGNOSIS — S2242XA Multiple fractures of ribs, left side, initial encounter for closed fracture: Secondary | ICD-10-CM | POA: Diagnosis not present

## 2020-08-14 MED ORDER — METHOCARBAMOL 500 MG PO TABS: 1000.0000 mg | ORAL_TABLET | Freq: Four times a day (QID) | ORAL | 0 refills | Status: AC | PRN

## 2020-08-14 MED ORDER — OXYCODONE HCL 10 MG PO TABS: 15.0000 mg | ORAL_TABLET | Freq: Four times a day (QID) | ORAL | 0 refills | Status: AC | PRN

## 2020-08-14 MED ORDER — ACETAMINOPHEN 500 MG PO TABS: 1000.0000 mg | ORAL_TABLET | Freq: Four times a day (QID) | ORAL | 0 refills | Status: DC | PRN

## 2020-08-14 MED ORDER — TRAMADOL HCL 50 MG PO TABS: 100.0000 mg | ORAL_TABLET | Freq: Four times a day (QID) | ORAL | 0 refills | Status: AC | PRN

## 2020-08-14 NOTE — Progress Notes (Signed)
DC instructions reviewed with patient and spouse. Belongings given to patient.

## 2020-08-14 NOTE — Discharge Instructions (Signed)
Rib Fracture  A rib fracture is a break or crack in one of the bones of the ribs. The ribs are long, curved bones that wrap around your chest and attach to your spine and your breastbone. The ribs protect your heart, lungs, and other organs in the chest. A broken or cracked rib is often painful but is not usually serious. Most rib fractures heal on their own over time. However, rib fractures can be more serious if multiple ribs are broken or if broken ribs move out of place and push against other structures or organs. What are the causes? This condition is caused by:  Repetitive movements with high force, such as pitching a baseball or having severe coughing spells.  A direct blow to the chest, such as a sports injury, a car accident, or a fall.  Cancer that has spread to the bones, which can weaken bones and cause them to break. What are the signs or symptoms? Symptoms of this condition include:  Pain when you breathe in or cough.  Pain when someone presses on the injured area.  Feeling short of breath. How is this diagnosed? This condition is diagnosed with a physical exam and medical history. Imaging tests may also be done, such as:  Chest X-ray.  CT scan.  MRI.  Bone scan.  Chest ultrasound. How is this treated? Treatment for this condition depends on the severity of the fracture. Most rib fractures usually heal on their own in 1-3 months. Sometimes healing takes longer if there is a cough that does not stop or if there are other activities that make the injury worse (aggravating factors). While you heal, you will be given medicines to control the pain. You will also be taught deep breathing exercises. Severe injuries may require hospitalization or surgery. Follow these instructions at home: Managing pain, stiffness, and swelling  If directed, apply ice to the injured area. ? Put ice in a plastic bag. ? Place a towel between your skin and the bag. ? Leave the ice on for  20 minutes, 2-3 times a day.  Take over-the-counter and prescription medicines only as told by your health care provider. Activity  Avoid a lot of activity and any activities or movements that cause pain. Be careful during activities and avoid bumping the injured rib.  Slowly increase your activity as told by your health care provider. General instructions  Do deep breathing exercises as told by your health care provider. This helps prevent pneumonia, which is a common complication of a broken rib. Your health care provider may instruct you to: ? Take deep breaths several times a day. ? Try to cough several times a day, holding a pillow against the injured area. ? Use a device called incentive spirometer to practice deep breathing several times a day.  Drink enough fluid to keep your urine pale yellow.  Do not wear a rib belt or binder. These restrict breathing, which can lead to pneumonia.  Keep all follow-up visits as told by your health care provider. This is important. Contact a health care provider if:  You have a fever. Get help right away if:  You have difficulty breathing or you are short of breath.  You develop a cough that does not stop, or you cough up thick or bloody sputum.  You have nausea, vomiting, or pain in your abdomen.  Your pain gets worse and medicine does not help. Summary  A rib fracture is a break or crack in one of  the bones of the ribs.  A broken or cracked rib is often painful but is not usually serious.  Most rib fractures heal on their own over time.  Treatment for this condition depends on the severity of the fracture.  Avoid a lot of activity and any activities or movements that cause pain. This information is not intended to replace advice given to you by your health care provider. Make sure you discuss any questions you have with your health care provider. Document Revised: 09/09/2017 Document Reviewed: 12/27/2016 Elsevier Patient  Education  [place].   Rib Fracture  A rib fracture is a break or crack in one of the bones of the ribs. The ribs are long, curved bones that wrap around your chest and attach to your spine and your breastbone. The ribs protect your heart, lungs, and other organs in the chest. A broken or cracked rib is often painful but is not usually serious. Most rib fractures heal on their own over time. However, rib fractures can be more serious if multiple ribs are broken or if broken ribs move out of place and push against other structures or organs. What are the causes? This condition is caused by:  Repetitive movements with high force, such as pitching a baseball or having severe coughing spells.  A direct blow to the chest, such as a sports injury, a car accident, or a fall.  Cancer that has spread to the bones, which can weaken bones and cause them to break. What are the signs or symptoms? Symptoms of this condition include:  Pain when you breathe in or cough.  Pain when someone presses on the injured area.  Feeling short of breath. How is this diagnosed? This condition is diagnosed with a physical exam and medical history. Imaging tests may also be done, such as:  Chest X-ray.  CT scan.  MRI.  Bone scan.  Chest ultrasound. How is this treated? Treatment for this condition depends on the severity of the fracture. Most rib fractures usually heal on their own in 1-3 months. Sometimes healing takes longer if there is a cough that does not stop or if there are other activities that make the injury worse (aggravating factors). While you heal, you will be given medicines to control the pain. You will also be taught deep breathing exercises. Severe injuries may require hospitalization or surgery. Follow these instructions at home: Managing pain, stiffness, and swelling  If directed, apply ice to the injured area. ? Put ice in a plastic bag. ? Place a towel between your skin  and the bag. ? Leave the ice on for 20 minutes, 2-3 times a day.  Take over-the-counter and prescription medicines only as told by your health care provider. Activity  Avoid a lot of activity and any activities or movements that cause pain. Be careful during activities and avoid bumping the injured rib.  Slowly increase your activity as told by your health care provider. General instructions  Do deep breathing exercises as told by your health care provider. This helps prevent pneumonia, which is a common complication of a broken rib. Your health care provider may instruct you to: ? Take deep breaths several times a day. ? Try to cough several times a day, holding a pillow against the injured area. ? Use a device called incentive spirometer to practice deep breathing several times a day.  Drink enough fluid to keep your urine pale yellow.  Do not wear a rib belt or binder.  These restrict breathing, which can lead to pneumonia.  Keep all follow-up visits as told by your health care provider. This is important. Contact a health care provider if:  You have a fever. Get help right away if:  You have difficulty breathing or you are short of breath.  You develop a cough that does not stop, or you cough up thick or bloody sputum.  You have nausea, vomiting, or pain in your abdomen.  Your pain gets worse and medicine does not help. Summary  A rib fracture is a break or crack in one of the bones of the ribs.  A broken or cracked rib is often painful but is not usually serious.  Most rib fractures heal on their own over time.  Treatment for this condition depends on the severity of the fracture.  Avoid a lot of activity and any activities or movements that cause pain. This information is not intended to replace advice given to you by your health care provider. Make sure you discuss any questions you have with your health care provider. Document Revised: 09/09/2017 Document Reviewed:  12/27/2016 Elsevier Patient Education  [place].

## 2020-08-14 NOTE — TOC Transition Note (Signed)
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  Pt admitted s/p fall down stairs with L 8-10 rib fx (10 with moderate displacement).  PTA, pt independent, lives at home with spouse.  PT/OT recommending no OP follow up, RW for home.  Patient prefers to have rolling walker delivered from [facility] facility to him at home.  Spoke with Cyndee Brightly, CSW at [facility]; she provided order form for rolling walker and fax number [phone] primary care physician and DME departments.  Faxed order form and clinical information to Dr. Collins Scotland and emailed Lavella Hammock Byrd/Gary Whitaker information to process rolling walker.  Spoke with patient's wife, with pt's permission, and explained that rolling walker will be shipped to patient's home from the [place].  She states that patient has a rolling walker without wheels that he can use until it arrives.  Wife states she [facility]   Final next level of care: Home/Self Care Barriers to Discharge: Barriers Resolved          Discharge Plan and Services   Discharge Planning Services: CM Consult            DME Arranged: Gilford Rile rolling DME Agency: Other - Comment Date DME Agency Contacted: 08/14/20 Time DME Agency Contacted: 1500 Representative spoke with at DME Agency: York Grice, [place] at [facility], [place]            Social Determinants of Health ([place]) Interventions     Readmission Risk Interventions No  flowsheet data found.  Reinaldo Raddle, RN, BSN  Trauma/Neuro ICU Case Manager [phone]

## 2020-08-19 ENCOUNTER — Other Ambulatory Visit: Payer: Self-pay

## 2020-08-19 ENCOUNTER — Emergency Department (HOSPITAL_COMMUNITY): Payer: No Typology Code available for payment source

## 2020-08-19 ENCOUNTER — Emergency Department (HOSPITAL_COMMUNITY)
Admission: EM | Admit: 2020-08-19 | Discharge: 2020-08-19 | Disposition: A | Discharge status: Home / Self Care | Payer: No Typology Code available for payment source | Attending: Emergency Medicine | Admitting: Emergency Medicine

## 2020-08-19 ENCOUNTER — Encounter (HOSPITAL_COMMUNITY): Payer: Self-pay | Admitting: Emergency Medicine

## 2020-08-19 DIAGNOSIS — Z87891 Personal history of nicotine dependence: Secondary | ICD-10-CM | POA: Insufficient documentation

## 2020-08-19 DIAGNOSIS — H539 Unspecified visual disturbance: Secondary | ICD-10-CM

## 2020-08-19 DIAGNOSIS — N183 Chronic kidney disease, stage 3 unspecified: Secondary | ICD-10-CM | POA: Insufficient documentation

## 2020-08-19 DIAGNOSIS — R451 Restlessness and agitation: Secondary | ICD-10-CM | POA: Diagnosis not present

## 2020-08-19 DIAGNOSIS — Z79899 Other long term (current) drug therapy: Secondary | ICD-10-CM | POA: Insufficient documentation

## 2020-08-19 DIAGNOSIS — Z85828 Personal history of other malignant neoplasm of skin: Secondary | ICD-10-CM | POA: Diagnosis not present

## 2020-08-19 DIAGNOSIS — H538 Other visual disturbances: Secondary | ICD-10-CM | POA: Insufficient documentation

## 2020-08-19 DIAGNOSIS — Z7982 Long term (current) use of aspirin: Secondary | ICD-10-CM | POA: Diagnosis not present

## 2020-08-19 DIAGNOSIS — F0281 Dementia in other diseases classified elsewhere with behavioral disturbance: Secondary | ICD-10-CM | POA: Diagnosis not present

## 2020-08-19 DIAGNOSIS — I129 Hypertensive chronic kidney disease with stage 1 through stage 4 chronic kidney disease, or unspecified chronic kidney disease: Secondary | ICD-10-CM | POA: Insufficient documentation

## 2020-08-19 DIAGNOSIS — E039 Hypothyroidism, unspecified: Secondary | ICD-10-CM | POA: Insufficient documentation

## 2020-08-19 DIAGNOSIS — G3183 Dementia with Lewy bodies: Secondary | ICD-10-CM | POA: Insufficient documentation

## 2020-08-19 LAB — BASIC METABOLIC PANEL
Anion gap: 10 (ref 5–15)
BUN: 20 mg/dL (ref 8–23)
CO2: 28 mmol/L (ref 22–32)
Calcium: 9.1 mg/dL (ref 8.9–10.3)
Chloride: 97 mmol/L — ABNORMAL LOW (ref 98–111)
Creatinine, Ser: 1.39 mg/dL — ABNORMAL HIGH (ref 0.61–1.24)
GFR, Estimated: 55 mL/min — ABNORMAL LOW (ref >60)
Glucose, Bld: 84 mg/dL (ref 70–99)
Potassium: 3.2 mmol/L — ABNORMAL LOW (ref 3.5–5.1)
Sodium: 135 mmol/L (ref 135–145)

## 2020-08-19 LAB — CBC
HCT: 34.7 % — ABNORMAL LOW (ref 39.0–52.0)
Hemoglobin: 11.8 g/dL — ABNORMAL LOW (ref 13.0–17.0)
MCH: 29.9 pg (ref 26.0–34.0)
MCHC: 34 g/dL (ref 30.0–36.0)
MCV: 87.8 fL (ref 80.0–100.0)
Platelets: 177 10*3/uL (ref 150–400)
RBC: 3.95 MIL/uL — ABNORMAL LOW (ref 4.22–5.81)
RDW: 12.3 % (ref 11.5–15.5)
WBC: 8.7 10*3/uL (ref 4.0–10.5)
nRBC: 0 % (ref 0.0–0.2)

## 2020-08-19 LAB — HEPATIC FUNCTION PANEL
ALT: 19 U/L (ref 0–44)
AST: 25 U/L (ref 15–41)
Albumin: 3.6 g/dL (ref 3.5–5.0)
Alkaline Phosphatase: 66 U/L (ref 38–126)
Bilirubin, Direct: 0.2 mg/dL (ref 0.0–0.2)
Indirect Bilirubin: 0.7 mg/dL (ref 0.3–0.9)
Total Bilirubin: 0.9 mg/dL (ref 0.3–1.2)
Total Protein: 5.6 g/dL — ABNORMAL LOW (ref 6.5–8.1)

## 2020-08-19 LAB — URINALYSIS, ROUTINE W REFLEX MICROSCOPIC
Bilirubin Urine: NEGATIVE
Glucose, UA: NEGATIVE mg/dL
Hgb urine dipstick: NEGATIVE
Ketones, ur: NEGATIVE mg/dL
Leukocytes,Ua: NEGATIVE
Nitrite: NEGATIVE
Protein, ur: NEGATIVE mg/dL
Specific Gravity, Urine: 1.009 (ref 1.005–1.030)
pH: 5 (ref 5.0–8.0)

## 2020-08-19 LAB — CBG MONITORING, ED: Glucose-Capillary: 74 mg/dL (ref 70–99)

## 2020-08-19 LAB — RAPID URINE DRUG SCREEN, HOSP PERFORMED
Amphetamines: NOT DETECTED
Barbiturates: NOT DETECTED
Benzodiazepines: POSITIVE — AB
Cocaine: NOT DETECTED
Opiates: NOT DETECTED
Tetrahydrocannabinol: POSITIVE — AB

## 2020-08-19 LAB — ETHANOL: Alcohol, Ethyl (B): <10 mg/dL (ref <10)

## 2020-08-19 LAB — AMMONIA: Ammonia: 19 umol/L (ref 9–35)

## 2020-08-19 MED ORDER — LORAZEPAM 2 MG/ML IJ SOLN
1.0000 mg | Freq: Once | INTRAMUSCULAR | Status: AC | PRN
  Administered 2020-08-19: 1 mg via INTRAVENOUS
  Filled 2020-08-19: qty 1

## 2020-08-19 NOTE — ED Triage Notes (Signed)
Patient arrives to ED with complaints of blurred vision and diplopia since Friday. Pt states the symptoms come and go through the day. Deneis sensory, speech or weakness deficits.

## 2020-08-19 NOTE — ED Provider Notes (Signed)
[facility] Provider Note   CSN: [id number] Arrival date & time: 08/19/20  1510     History Chief Complaint  Patient presents with  . Blurred Vision    [facility] is a 69 y.o. male with history of Lewy body dementia, CKD, hypertension hyperlipidemia, hypothyroidism, chronic pain syndrome presents to the ED for evaluation of visual disturbances.  Onset approximately in the last 48 to 72 hours.  Patient tells me he is seeing double and his vision is blurred.  This is affecting both eyes, fluctuating but currently blurred vision in the left eye when holding a piece of paper in front of him at arms length.  He reports pain from his big toe all the way to the top of his head as well as in the left side of his ribs.  Of note, patient recently admitted a week ago after a mechanical fall leading to 3 left-sided rib fractures.  He was admitted for pain control and mobilization.  Was discharged on tramadol, oxycodone.  Of note, patient is on several other medicines for his dementia/mood including Requip, Risperdal, Seroquel, Lyrica, Xanax.  Patient denies headache.  He has pain in his neck that appears to be chronic.  Wife and daughter at bedside provide more history.  They state that 2 days ago they noticed "slurred" speech and unsteady gait.  Patient's behavior is also different, seems more agitated, irritable, yelling. They are concerned he may have fallen again because he has a new bruise in the left side of his arm.  Patient denies this adamantly.  Family was unable to convince patient to come to the ED for evaluation for his symptoms.  Patient denies nausea, vomiting, chest pain, abdominal pain, diarrhea.  Denies any falls.  Denies alcohol or illicit drug use.  Used to smoke hemp daily but states he has not done this since being discharged from the hospital.  Family is concerned about a stroke or an unwitnessed fall.  No recent medication changes.  No fever,  cough.  HPI     Past Medical History:  Diagnosis Date  . Anxiety   . Arthritis   . Back pain    chronic  . Chronic kidney disease    stage III  renal disease   . Degenerative disc disease   . Depression   . Dizziness   . Dupuytren's contracture of right hand   . Dyspnea   . Elevated LFTs   . Erectile dysfunction   . Fall at home 08/12/2020  . Hypothyroidism   . Jaundice with stoppage of bile flow    after stent romoved  . Lewy body dementia ([place])   . Memory deficits 04/23/2013  . Orthostatic hypotension   . Osgood-Schlatter's disease   . Restless leg syndrome 04/23/2013  . Skin cancer    basal cell on back x2  . Sleep apnea with use of continuous positive airway pressure (CPAP)   . Tachycardia    heart monitor last week for 48 hours no report yet    Patient Active Problem List   Diagnosis Date Noted  . Multiple rib fractures 08/13/2020  . Multiple fractures of ribs, left side, initial encounter for closed fracture 08/11/2020  . Dementia with behavioral disturbance ([place]) 03/25/2018  . Benzodiazepine dependence ([place]) 10/02/2015  . Polypharmacy 10/02/2015  . Opioid use disorder, moderate, dependence ([place]) 10/02/2015  . Acute encephalopathy 10/01/2015  . Altered mental status 10/01/2015  . Facial droop 09/10/2015  . Renal  insufficiency   . Central sleep apnea comorbid with prescribed opioid use, moderate 04/02/2014  . Memory deficits 04/23/2013  . Restless leg syndrome 04/23/2013  . Sleep apnea with use of continuous positive airway pressure (CPAP)   . Nonspecific abnormal results of liver function study 12/08/2011  . HYPOTHYROIDISM 09/19/2009  . DEGENERATIVE JOINT DISEASE 09/19/2009  . ARTHRITIS 09/19/2009  . DEGENERATIVE DISC DISEASE 09/19/2009  . NECK PAIN, CHRONIC 09/19/2009  . BACK PAIN, CHRONIC 09/19/2009  . OSGOOD SCHLATTER'S DISEASE 09/19/2009  . DIZZINESS 09/19/2009  . DYSPNEA 09/19/2009  . ANXIETY 07/18/2009  . DEPRESSION 07/18/2009  . ORTHOSTATIC  HYPOTENSION 07/18/2009  . SKIN CANCER, HX OF 07/18/2009    Past Surgical History:  Procedure Laterality Date  . APPENDECTOMY    . BASAL CELL CARCINOMA EXCISION  2008,2009  . CARPAL TUNNEL RELEASE  2003  . CERVICAL FUSION  1985  . CHOLECYSTECTOMY    . GALLBLADDER SURGERY  2007-2008  . LIVER BIOPSY  12/27/2011   Procedure: LIVER BIOPSY;  Surgeon: Inda Castle, MD;  Location: [facility];  Service: Endoscopy;  Laterality: N/A;  pt moved from 3/22 to 3/18 ( aw)  . LUMBAR FUSION  1996  . TONSILLECTOMY         Family History  Problem Relation Age of Onset  . Coronary artery disease Mother   . Heart attack Mother        stents in neck and heart ?  Marland Kitchen Crohn's disease Mother   . Cirrhosis Father   . Cirrhosis Sister   . Prostate cancer Maternal Uncle   . Liver cancer Cousin   . Colitis Daughter   . Diabetes Maternal Uncle   . Diabetes Brother   . Kidney disease Maternal Aunt   . Kidney disease Cousin     Social History   Tobacco Use  . Smoking status: Former Smoker    Quit date: 12/27/1975    Years since quitting: 44.6  . Smokeless tobacco: Never Used  Vaping Use  . Vaping Use: Never used  Substance Use Topics  . Alcohol use: No  . Drug use: No    Comment: history of opiate abuse    Home Medications Prior to Admission medications   Medication Sig Start Date End Date Taking? Authorizing Provider  acetaminophen (TYLENOL) 500 MG tablet Take 2 tablets (1,000 mg total) by mouth every 6 (six) hours as needed. 08/14/20   Jill Alexanders, PA-C  ALPRAZolam Duanne Moron) 1 MG tablet Take 1 mg by mouth at bedtime.    [provider]  aspirin 81 MG chewable tablet Chew 1 tablet (81 mg total) by mouth daily. Patient not taking: Reported on 08/11/2020 09/11/15   Maryellen Pile, MD  atorvastatin (LIPITOR) 80 MG tablet Take 1 tablet (80 mg total) by mouth daily at 6 PM. Patient not taking: Reported on 09/30/2015 09/11/15   Maryellen Pile, MD  carbidopa-levodopa (SINEMET IR)  25-100 MG tablet Take 2 tablets by mouth daily.    [provider]  Carboxymethylcellulose Sod PF 1 % GEL Place 1 drop into both eyes at bedtime.    [provider]  colestipol (COLESTID) 1 g tablet Take 1 g by mouth 2 (two) times daily.    [provider]  diphenoxylate-atropine (LOMOTIL) 2.5-0.025 MG tablet Take 1 tablet by mouth in the morning and at bedtime.    [provider]  DULoxetine (CYMBALTA) 30 MG capsule Take 30 mg by mouth 2 (two) times daily.    [provider]  hydrochlorothiazide (HYDRODIURIL) 25 MG tablet Take 25 mg by mouth daily.    [provider]  levothyroxine (SYNTHROID) 137 MCG tablet Take 137 mcg by mouth daily before breakfast.    [provider]  methocarbamol (ROBAXIN) 500 MG tablet Take 2 tablets (1,000 mg total) by mouth every 6 (six) hours as needed for up to 8 days for muscle spasms. 08/14/20 08/22/20  Jill Alexanders, PA-C  Multiple Vitamin (MULTIVITAMIN WITH MINERALS) TABS tablet Take 1 tablet by mouth daily.    [provider]  oxyCODONE 10 MG TABS Take 1.5 tablets (15 mg total) by mouth every 6 (six) hours as needed for up to 5 days for moderate pain or severe pain. 08/14/20 08/19/20  Jill Alexanders, PA-C  pregabalin (LYRICA) 150 MG capsule Take 150 mg by mouth 2 (two) times daily.    [provider]  PRESCRIPTION MEDICATION Take 1 capsule by mouth daily. Medication: Naltrexone    [provider]  QUEtiapine (SEROQUEL) 100 MG tablet Take 100 mg by mouth at bedtime.    [provider]  risperiDONE (RISPERDAL) 0.5 MG tablet Take 1 tablet (0.5 mg total) by mouth 2 (two) times daily. Patient not taking: Reported on 08/11/2020 03/25/18   Patrecia Pour, NP  rOPINIRole (REQUIP) 0.25 MG tablet Take 1 tablet (0.25 mg total) by mouth at bedtime. Patient not taking: Reported on 08/11/2020 03/25/18   Patrecia Pour, NP  terazosin (HYTRIN) 1 MG capsule Take 1 mg by mouth at  bedtime.    [provider]  traMADol (ULTRAM) 50 MG tablet Take 2 tablets (100 mg total) by mouth every 6 (six) hours as needed for up to 5 days. 08/14/20 08/19/20  Jill Alexanders, PA-C    Allergies    Vesicare [solifenacin succinate], Sulfa antibiotics, Aricept [donepezil hydrochloride], Gabapentin, Lamictal [lamotrigine], Namenda [memantine hcl], Sulfur, Exelon [rivastigmine], Fentanyl, and Penicillins  Review of Systems   Review of Systems  Eyes: Positive for visual disturbance.  Cardiovascular: Positive for chest pain (left ribs).  Musculoskeletal: Positive for arthralgias.  Psychiatric/Behavioral: Positive for agitation and behavioral problems.  All other systems reviewed and are negative.   Physical Exam Updated Vital Signs BP 131/69 (BP Location: Right Arm)   Pulse 62   Temp 98.4 F (36.9 C) (Oral)   Resp 13   Ht 5\' 8"  (1.727 m)   Wt 72.6 kg   SpO2 97%   BMI 24.33 kg/m   Physical Exam Vitals and nursing note reviewed.  Constitutional:      General: He is not in acute distress.    Appearance: He is well-developed.     Comments: NAD.  HENT:     Head: Normocephalic and atraumatic.     Right Ear: External ear normal.     Left Ear: External ear normal.     Nose: Nose normal.  Eyes:     Conjunctiva/sclera: Conjunctivae normal.     Comments: Paper held at arms length - patient report letters blurry on left eye only. No diplopia currently. Full EOMs no entrapment, ptosis.   Neck:     Comments: Diffuse midline/paraspinal tenderness. Full ROM of neck but no rigidity, meningismus.  Cardiovascular:     Rate and Rhythm: Normal rate and regular rhythm.     Heart sounds: Normal heart sounds.  Pulmonary:     Effort: Pulmonary effort is normal.     Breath sounds: Normal breath sounds.  Chest:     Chest wall: Tenderness (left lateral ribs)  present.  Abdominal:     Palpations: Abdomen is soft.     Tenderness: There is no abdominal tenderness.  Musculoskeletal:         General: No deformity. Normal range of motion.     Cervical back: Normal range of motion and neck supple. Tenderness present.  Skin:    General: Skin is warm and dry.     Capillary Refill: Capillary refill takes less than 2 seconds.  Neurological:     Mental Status: He is alert and oriented to person, place, and time.     Comments:   Mental Status: Patient is awake, alert, oriented to person, place, year, and situation. Patient is able to give a clear and coherent history. Speech is fluent and clear without dysarthria or aphasia. No signs of neglect.  Cranial Nerves: I not tested II visual fields full bilaterally. PERRL.   III, IV, VI EOMs intact without ptosis V sensation to light touch intact in all 3 divisions of trigeminal nerve bilaterally  VII facial movements symmetric bilaterally VIII hearing intact to voice/conversation  IX, X no uvula deviation, symmetric rise of soft palate/uvula XI 5/5 SCM and trapezius strength bilaterally  XII tongue protrusion midline, symmetric L/R movements  Motor: Strength 5/5 in upper/lower extremities. Sensation to light touch intact in face, upper/lower extremities. No pronator drift. No leg drop.  Cerebellar: No ataxia with finger to nose. Steady gait.   Psychiatric:        Behavior: Behavior normal.        Thought Content: Thought content normal.        Judgment: Judgment normal.     ED Results / Procedures / Treatments   Labs (all labs ordered are listed, but only abnormal results are displayed) Labs Reviewed  BASIC METABOLIC PANEL - Abnormal; Notable for the following components:      Result Value   Potassium 3.2 (*)    Chloride 97 (*)    Creatinine, Ser 1.39 (*)    GFR, Estimated 55 (*)    All other components within normal limits  CBC - Abnormal; Notable for the following components:   RBC 3.95 (*)    Hemoglobin 11.8 (*)    HCT 34.7 (*)    All other components within normal limits  RAPID URINE DRUG SCREEN, HOSP  PERFORMED - Abnormal; Notable for the following components:   Benzodiazepines POSITIVE (*)    Tetrahydrocannabinol POSITIVE (*)    All other components within normal limits  HEPATIC FUNCTION PANEL - Abnormal; Notable for the following components:   Total Protein 5.6 (*)    All other components within normal limits  URINALYSIS, ROUTINE W REFLEX MICROSCOPIC  ETHANOL  AMMONIA  CBG MONITORING, ED    EKG EKG Interpretation  Date/Time:  Tuesday August 19 2020 15:17:35 EST Ventricular Rate:  59 PR Interval:  116 QRS Duration: 94 QT Interval:  410 QTC Calculation: 405 R Axis:   38 Text Interpretation: Sinus bradycardia Otherwise normal ECG No significant change since last tracing Confirmed by Wandra Arthurs [phone]) on 08/19/2020 5:36:21 PM   Radiology DG Chest 2 View  Result Date: 08/19/2020 CLINICAL DATA:  Chest pain. EXAM: CHEST - 2 VIEW COMPARISON:  August 12, 2020. FINDINGS: The heart size and mediastinal contours are within normal limits. No pneumothorax is noted. Right lung is clear. Stable displaced left seventh and eighth rib fractures are noted. Small left pleural effusion is noted. IMPRESSION: Stable displaced left seventh and eighth rib fractures. Small left pleural effusion  is noted. Electronically Signed   By: Marijo Conception M.D.   On: 08/19/2020 17:48   CT Head Wo Contrast  Result Date: 08/19/2020 CLINICAL DATA:  Blurred vision, multiple falls EXAM: CT HEAD WITHOUT CONTRAST CT CERVICAL SPINE WITHOUT CONTRAST TECHNIQUE: Multidetector CT imaging of the head and cervical spine was performed following the standard protocol without intravenous contrast. Multiplanar CT image reconstructions of the cervical spine were also generated. COMPARISON:  CT head 09/30/2015 FINDINGS: CT HEAD FINDINGS Brain: Normal anatomic configuration. Parenchymal volume loss is commensurate with the patient's age. Mild periventricular white matter changes are present likely reflecting the sequela of small  vessel ischemia. Remote lacunar infarcts are again noted within the right subinsular white matter. No abnormal intra or extra-axial mass lesion or fluid collection. No abnormal mass effect or midline shift. No evidence of acute intracranial hemorrhage or infarct. Ventricular size is normal. Cerebellum unremarkable. Vascular: No asymmetric hyperdense vasculature at the skull base. Skull: Intact Sinuses/Orbits: Paranasal sinuses are clear. Orbits are unremarkable. Other: Mastoid air cells and middle ear cavities are clear. CT CERVICAL SPINE FINDINGS Alignment: Anterior cervical discectomy and fusion without instrumentation of C6-7 has been performed with solid fusion of these segments. There is mild straightening of the cervical spine, likely positional or degenerative in nature. Otherwise normal alignment. No listhesis. Skull base and vertebrae: The craniocervical junction is unremarkable. The atlantodental interval is normal. No acute fracture of the cervical spine. No lytic or blastic bone lesion. Soft tissues and spinal canal: No prevertebral fluid or swelling. No visible canal hematoma. Disc levels: Review of the sagittal reformats demonstrates preservation of vertebral body height. There is intervertebral disc space narrowing and endplate remodeling throughout the cervical spine involving the residual disc spaces, most severe at C3-C6, in keeping with changes of severe degenerative disc disease.The spinal canal is diffusely congenitally narrowed. The prevertebral soft tissues are not thickened. Review of the axial images demonstrates multilevel uncovertebral and facet arthrosis resulting in multilevel neural foraminal narrowing, most severe at C3-4 on the left and C4-5 bilaterally where this is relatively mild in severity. No focal canal stenosis identified. Upper chest: Unremarkable Other: None significant IMPRESSION: No acute intracranial injury.  No calvarial fracture. No acute fracture or listhesis of the  cervical spine. Electronically Signed   By: Fidela Salisbury MD   On: 08/19/2020 18:34   CT Cervical Spine Wo Contrast  Result Date: 08/19/2020 CLINICAL DATA:  Blurred vision, multiple falls EXAM: CT HEAD WITHOUT CONTRAST CT CERVICAL SPINE WITHOUT CONTRAST TECHNIQUE: Multidetector CT imaging of the head and cervical spine was performed following the standard protocol without intravenous contrast. Multiplanar CT image reconstructions of the cervical spine were also generated. COMPARISON:  CT head 09/30/2015 FINDINGS: CT HEAD FINDINGS Brain: Normal anatomic configuration. Parenchymal volume loss is commensurate with the patient's age. Mild periventricular white matter changes are present likely reflecting the sequela of small vessel ischemia. Remote lacunar infarcts are again noted within the right subinsular white matter. No abnormal intra or extra-axial mass lesion or fluid collection. No abnormal mass effect or midline shift. No evidence of acute intracranial hemorrhage or infarct. Ventricular size is normal. Cerebellum unremarkable. Vascular: No asymmetric hyperdense vasculature at the skull base. Skull: Intact Sinuses/Orbits: Paranasal sinuses are clear. Orbits are unremarkable. Other: Mastoid air cells and middle ear cavities are clear. CT CERVICAL SPINE FINDINGS Alignment: Anterior cervical discectomy and fusion without instrumentation of C6-7 has been performed with solid fusion of these segments. There is mild straightening of the cervical spine,  likely positional or degenerative in nature. Otherwise normal alignment. No listhesis. Skull base and vertebrae: The craniocervical junction is unremarkable. The atlantodental interval is normal. No acute fracture of the cervical spine. No lytic or blastic bone lesion. Soft tissues and spinal canal: No prevertebral fluid or swelling. No visible canal hematoma. Disc levels: Review of the sagittal reformats demonstrates preservation of vertebral body height. There is  intervertebral disc space narrowing and endplate remodeling throughout the cervical spine involving the residual disc spaces, most severe at C3-C6, in keeping with changes of severe degenerative disc disease.The spinal canal is diffusely congenitally narrowed. The prevertebral soft tissues are not thickened. Review of the axial images demonstrates multilevel uncovertebral and facet arthrosis resulting in multilevel neural foraminal narrowing, most severe at C3-4 on the left and C4-5 bilaterally where this is relatively mild in severity. No focal canal stenosis identified. Upper chest: Unremarkable Other: None significant IMPRESSION: No acute intracranial injury.  No calvarial fracture. No acute fracture or listhesis of the cervical spine. Electronically Signed   By: Fidela Salisbury MD   On: 08/19/2020 18:34   MR [name] CONTRAST  Result Date: 08/19/2020 CLINICAL DATA:  69 year old male with blurred vision and diplopia for 4 days. Dizziness. Ataxia. EXAM: MRI HEAD WITHOUT CONTRAST TECHNIQUE: Multiplanar, multiecho pulse sequences of the brain and surrounding structures were obtained without intravenous contrast. COMPARISON:  Head and cervical spine CT earlier today. Brain MRI 09/10/2015. FINDINGS: Brain: No restricted diffusion to suggest acute infarction. No midline shift, mass effect, evidence of mass lesion, ventriculomegaly, extra-axial collection or acute intracranial hemorrhage. Cervicomedullary junction and pituitary are within normal limits. Patchy and scattered cerebral white matter T2 and FLAIR hyperintensity has increased since 2016, now up to moderate for age. Maximal involvement at the right external capsule as before. Associated small chronic lacunar infarcts of the right caudate and lentiform, increased since 2016. No cortical encephalomalacia or chronic cerebral blood products identified. The other deep gray nuclei and brainstem remain within normal limits. There is a stable small chronic infarct  of the right cerebellum (series 15, image 9). Vascular: Major intracranial vascular flow voids are stable since 2016. Skull and upper cervical spine: Cervical spine detailed separately today. Skull bone marrow signal is normal. Sinuses/Orbits: Orbits appear stable since 2016 and negative. Normal suprasellar cistern and optic chiasm. Unremarkable noncontrast cavernous sinuses. Paranasal sinuses and mastoids are stable and well pneumatized. Other: Scalp and face appear negative. IMPRESSION: No acute intracranial abnormality identified. But there has been progression of chronic small vessel disease since 2016, particularly in the right basal ganglia. Electronically Signed   By: Genevie Ann M.D.   On: 08/19/2020 20:30   MR Cervical Spine Wo Contrast  Result Date: 08/19/2020 CLINICAL DATA:  69 year old male with blurred vision and diplopia for 4 days. Dizziness. Ataxia. EXAM: MRI CERVICAL SPINE WITHOUT CONTRAST TECHNIQUE: Multiplanar, multisequence MR imaging of the cervical spine was performed. No intravenous contrast was administered. COMPARISON:  Brain MRI today reported separately. Cervical spine CT today. FINDINGS: Alignment: Straightening of cervical lordosis with mild dextroconvex cervical scoliosis as seen by CT today. Vertebrae: Advanced chronic degenerative endplate changes from J6-G8. Chronic interbody ankylosis at C6-C7. Mild degenerative marrow edema and/or fluid-filled subchondral cysts at C4 and C5. Background bone marrow signal within normal limits. Cord: No convincing cervical spinal cord signal abnormality despite degenerative stenosis with some cord mass effect, detailed below. Negative visible upper thoracic cord. Posterior Fossa, vertebral arteries, paraspinal tissues: Cervicomedullary junction is within normal limits. Brain detailed separately today.  Preserved major vascular flow voids in the neck. Negative visible neck soft tissues, lung apices. Disc levels: C2-C3: Mild to moderate facet  hypertrophy greater on the left. Mild to moderate left C3 foraminal stenosis. C3-C4: Disc space loss. Circumferential but mostly anterior disc osteophyte complex. Mild spinal stenosis. No cord mass effect. Moderate bilateral C4 foraminal stenosis. C4-C5: Disc space loss with circumferential disc osteophyte complex. Bulky anterior endplate osteophytes. Mild to moderate facet and ligament flavum hypertrophy greater on the left. Mild spinal stenosis. Mild if any ventral cord mass effect. Moderate to severe left and mild-to-moderate right C5 foraminal stenosis. C5-C6: Disc space loss with circumferential disc osteophyte complex. Mild ligament flavum hypertrophy. Mild spinal stenosis. Mild ventral cord mass effect. Moderate to severe left greater than right C6 foraminal stenosis. C6-C7:  Solid ankylosis.  No stenosis. C7-T1: Circumferential disc bulge and endplate spurring eccentric to the left with severe involvement and stenosis at the left C8 neural foramen. Superimposed moderate facet and ligament flavum hypertrophy greater on the left. No spinal stenosis. Mild right C8 foraminal stenosis. Similar multifactorial left eccentric T1 neural foraminal stenosis. IMPRESSION: Solid ankylosis of C6-C7 with advanced disc and endplate degeneration H4-L9 through C5-C6. Severe adjacent segment disease at the latter. Mild spinal stenosis at each of those levels with up to mild cord mass effect but no cord signal abnormality. Moderate or severe degenerative neural foraminal stenosis at the bilateral C4, left C5, bilateral C6, left C8, and left T1 nerve levels. Electronically Signed   By: Genevie Ann M.D.   On: 08/19/2020 20:37    Procedures Procedures (including critical care time)  Medications Ordered in ED Medications  LORazepam (ATIVAN) injection 1 mg (1 mg Intravenous Given 08/19/20 1831)    ED Course  I have reviewed the triage vital signs and the nursing notes.  Pertinent labs & imaging results that were available  during my care of the patient were reviewed by me and considered in my medical decision making (see chart for details).  Clinical Course as of Aug 19 2125  Tue Aug 19, 2020  [place] with EDP Darl Householder agrees with work up and adding MRI    [CG]  1840 Stable displaced left seventh and eighth rib fractures. Small left pleural effusion is noted.  CT Cervical Spine Wo Contrast [CG]  2110 IMPRESSION: No acute intracranial abnormality identified. But there has been progression of chronic small vessel disease since 2016, particularly in the right basal ganglia.  MR [name] CONTRAST [CG]    Clinical Course User Index [CG] Arlean Hopping   MDM Rules/Calculators/A&P                          69 year old male with history of Lewy body dementia with behavioral issues, recent fall with 3 left rib fractures presents to the ED for intermittent visual disturbances.  Witnessed slurred speech and unsteady gait.  EMR, triage nurse notes reviewed to assist with MDM and obtain more history.  Additional information obtained from wife and daughter at bedside.  Had admission last week for a fall leading to 3 rib fractures, discharged on tramadol, oxycodone.  No head/cervical spine imaging done at that time.  On my exam patient reports subjective left blurred vision, remainder of neuro exam is unremarkable.  His affect is slightly labile, some outburst towards family but overall cooperative and redirectable.  DDx includes occult infection, electrolyte abnormality, illicit drug  use, EtOH, hyper ammonemia Mia, intracranial injury/bleed, stroke.  ER work-up initiated in triage by triage RN including some lab work and urinalysis.  After my encounter I added LFTs, EtOH, ammonia, UDS, chest x-ray, neuroimaging including CT head/cervical spine, EKG.  ER work-up personally visualized and interpreted.  Lab work reviewed-positive benzodiazepines, THC.  Patient is on Xanax  chronically and admits to previous hemp use.  No leukocytosis.  Stable anemia.  LFTs, ammonia, EtOH normal.  UA without infection.  Imaging reviewed-no acute intracranial abnormalities other than progression of chronic small vessel disease from 2016.  No acute intracranial/cervical injuries.  Noted stable left-sided rib fractures with a small left pleural effusion.  Patient has no fever, cough, white count doubt pneumonia at this time.  2125: Patient has been reevaluated several times by myself and EDP and no clinical decline, continues to report intermittent blurred vision in both eyes that comes and goes.  Ambulatory, tolerating p.o.  At this time high suspicion for polypharmacy or side effects from recent fall/acute pain in setting of known dementia.  Appropriate for discharge back home with family.  Updated daughter who is in agreement with plan of care.  Recommended close neurology and/or ophthalmology follow-up for repeat evaluation. Final Clinical Impression(s) / ED Diagnoses Final diagnoses:  Visual disturbance    Rx / DC Orders ED Discharge Orders    None       Arlean Hopping 08/19/20 2126    Drenda Freeze, MD 08/20/20 2040

## 2020-08-19 NOTE — Discharge Instructions (Addendum)
You were seen in the ED for intermittent visual disturbances in your eyes, slurred speech, unsteady gait for the last couple of days  Lab work, imaging today did not reveal a specific cause to your symptoms.  You had THC in your system as well as benzodiazepines which you are chronically taking (Xanax).  Avoid any THC products, alcohol.  Some of your medicines may be interacting with each other and collectively could cause some transient mental status changes.  Also, a recent accident or acute pain could cause/explain some of your symptoms.  We recommend follow-up with neurology and/or ophthalmology for repeat evaluation of your symptoms and to make sure that you are improving, you may benefit from a repeat eye exam as well.  Return to the ED for worsening or new symptoms, new symptoms to suggest stroke, infection

## 2021-08-05 IMAGING — MR MR CERVICAL SPINE W/O CM
5 series · 35 of 48 positions shown · non-contrast
Comparison: Brain MRI today reported separately. Cervical spine CT
today.

CLINICAL DATA: 69-year-old male with blurred vision and diplopia
for 4 days. Dizziness. Ataxia.

EXAM:
MRI CERVICAL SPINE WITHOUT CONTRAST
TECHNIQUE: Multiplanar, multisequence MR imaging of the cervical spine was
performed. No intravenous contrast was administered.

[Series 5: T2 · sagittal · 3.0mm · 0.69mm/px · 6 of 15 slices shown (1 of 2)]
[im 1/15]
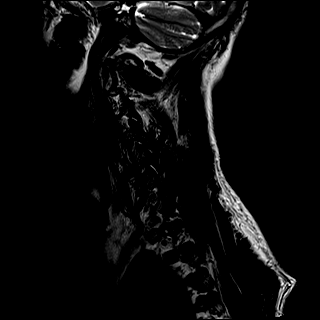
[im 3/15]
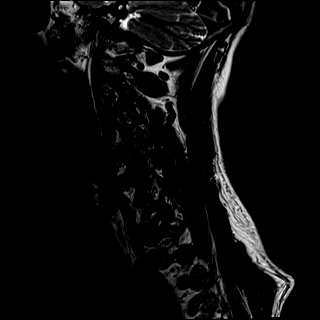
[im 6/15]
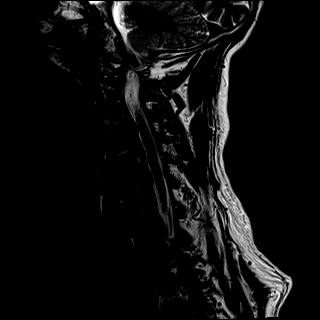
[im 9/15]
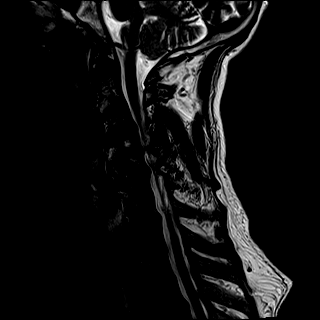
[im 12/15]
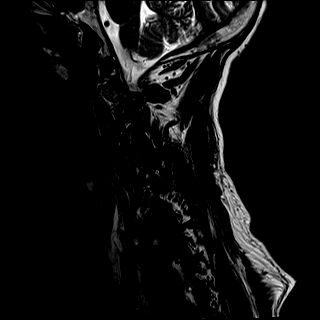
[im 15/15]
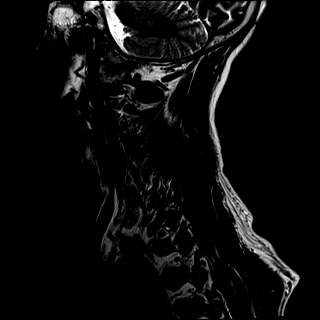

[Series 6: T1 · sagittal · 3.0mm · 0.69mm/px · 6 of 15 slices shown]
[im 1/15]
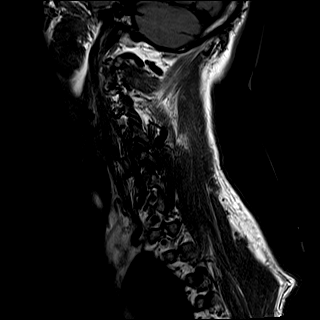
[im 3/15]
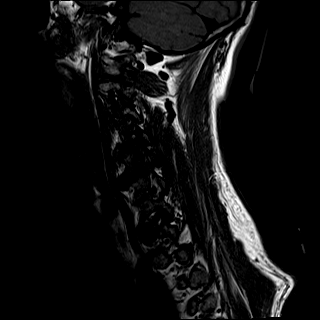
[im 6/15]
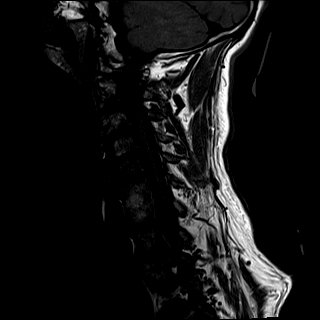
[im 9/15]
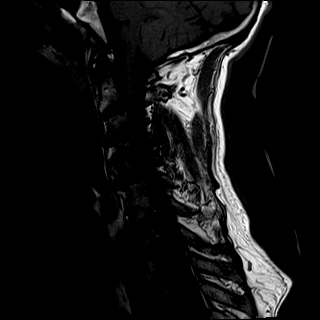
[im 12/15]
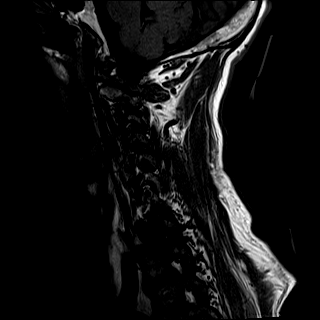
[im 15/15]
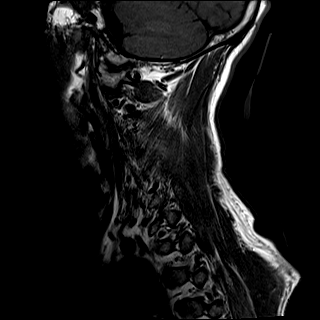

[Series 7: STIR · sagittal · 3.0mm · 0.86mm/px · 7 of 15 slices shown]
[im 1/15]
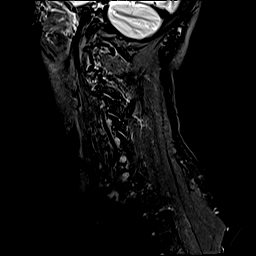
[im 3/15]
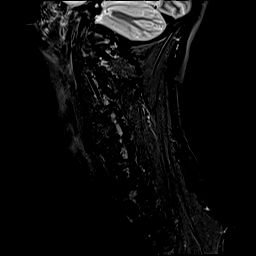
[im 5/15]
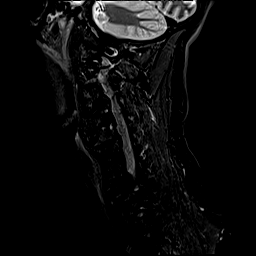
[im 8/15]
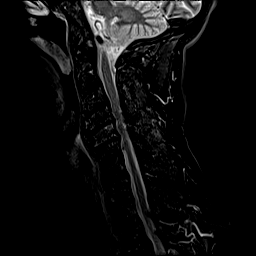
[im 10/15]
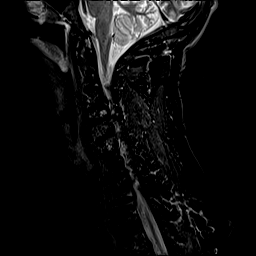
[im 12/15]
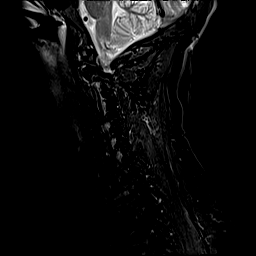
[im 15/15]
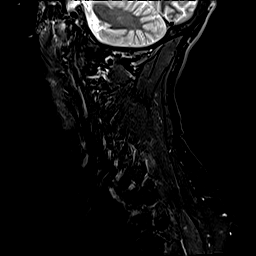

[Series 8: T2 · axial · 3.0mm · 0.66mm/px · z∈[-208,-108]mm · 8 of 31 slices shown (2 of 2)]
[im 1/31]
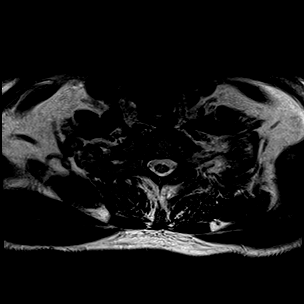
[im 5/31]
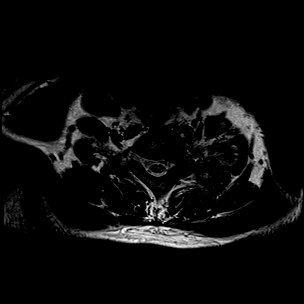
[im 10/31]
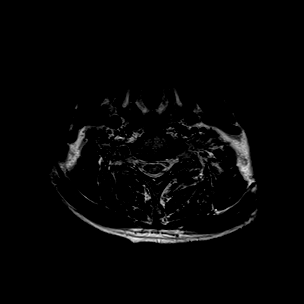
[im 14/31]
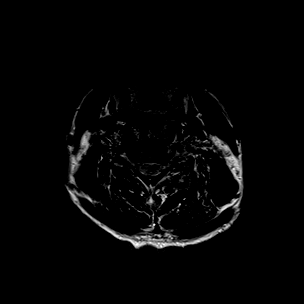
[im 17/31]
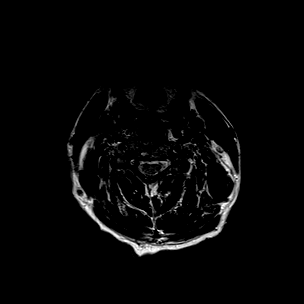
[im 21/31]
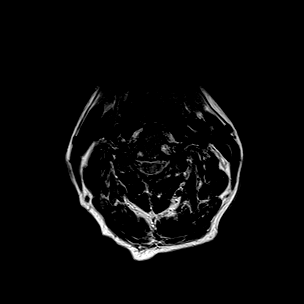
[im 26/31]
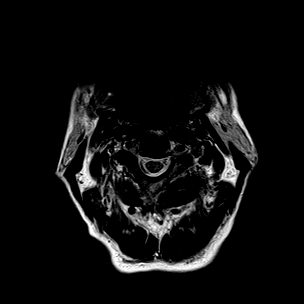
[im 31/31]
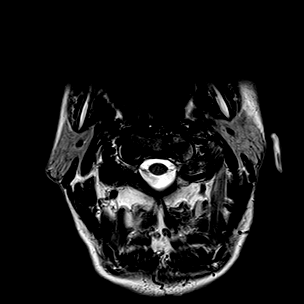

[Series 9: GRE · axial · 3.0mm · 0.39mm/px · z∈[-208,-108]mm · 8 of 34 slices shown]
[im 1/34]
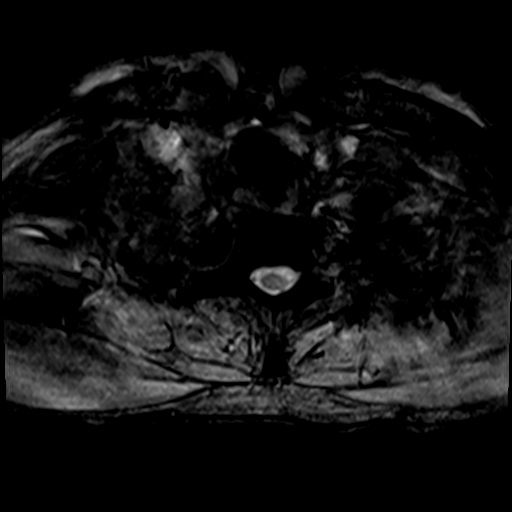
[im 5/34]
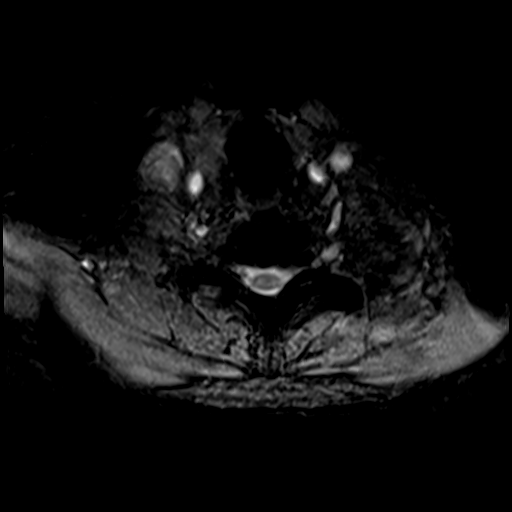
[im 10/34]
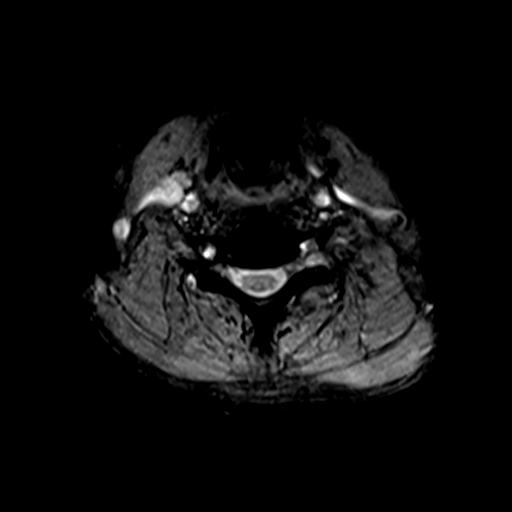
[im 15/34]
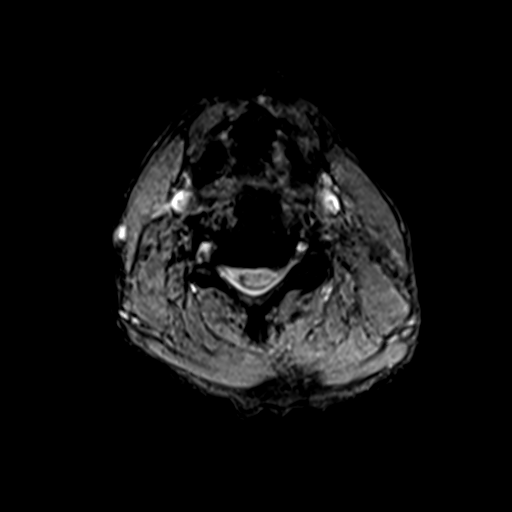
[im 19/34]
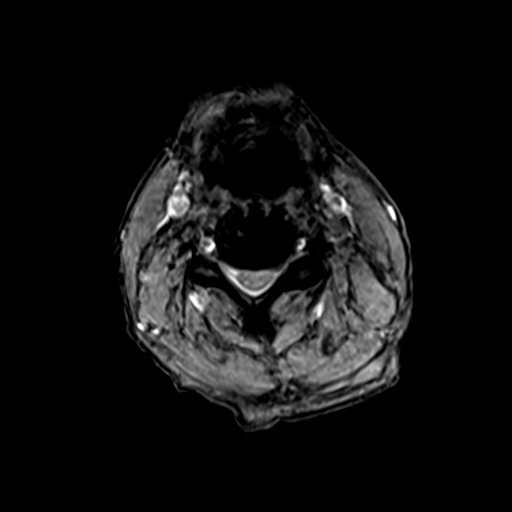
[im 24/34]
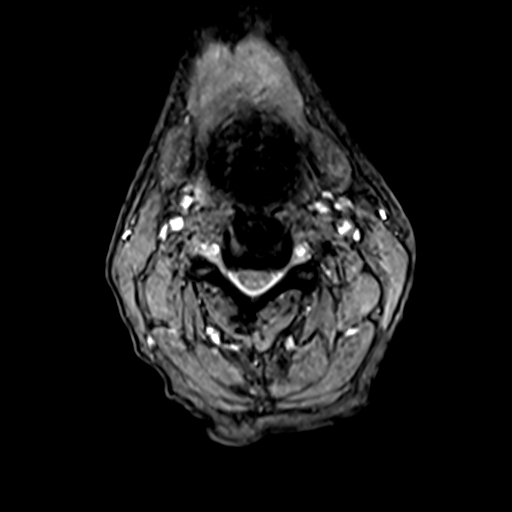
[im 29/34]
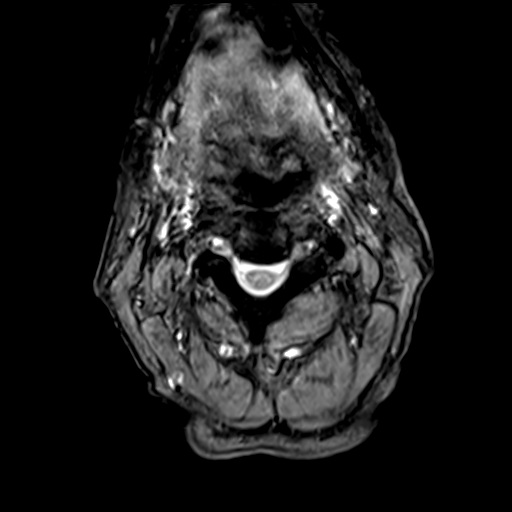
[im 34/34]
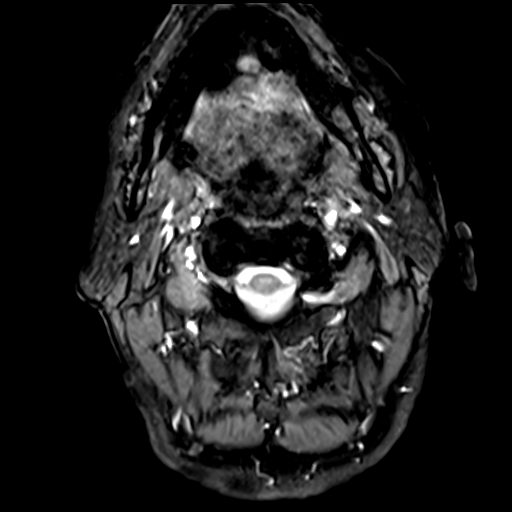

[35 of 48 positions shown; findings below may reference images not displayed]

FINDINGS: Alignment: Straightening of cervical lordosis with mild dextroconvex
cervical scoliosis as seen by CT today.

Vertebrae: Advanced chronic degenerative endplate changes from
C3-C5. Chronic interbody ankylosis at C6-C7. Mild degenerative
marrow edema and/or fluid-filled subchondral cysts at C4 and C5.
Background bone marrow signal within normal limits.

Cord: No convincing cervical spinal cord signal abnormality despite
degenerative stenosis with some cord mass effect, detailed below.
Negative visible upper thoracic cord.

Posterior Fossa, vertebral arteries, paraspinal tissues:
Cervicomedullary junction is within normal limits. Brain detailed
separately today. Preserved major vascular flow voids in the neck.
Negative visible neck soft tissues, lung apices.

Disc levels:

C2-C3: Mild to moderate facet hypertrophy greater on the left. Mild
to moderate left C3 foraminal stenosis.

C3-C4: Disc space loss. Circumferential but mostly anterior disc
osteophyte complex. Mild spinal stenosis. No cord mass effect.
Moderate bilateral C4 foraminal stenosis.

C4-C5: Disc space loss with circumferential disc osteophyte complex.
Bulky anterior endplate osteophytes. Mild to moderate facet and
ligament flavum hypertrophy greater on the left. Mild spinal
stenosis. Mild if any ventral cord mass effect. Moderate to severe
left and mild-to-moderate right C5 foraminal stenosis.

C5-C6: Disc space loss with circumferential disc osteophyte complex.
Mild ligament flavum hypertrophy. Mild spinal stenosis. Mild ventral
cord mass effect. Moderate to severe left greater than right C6
foraminal stenosis.

C6-C7:  Solid ankylosis.  No stenosis.

C7-T1: Circumferential disc bulge and endplate spurring eccentric to
the left with severe involvement and stenosis at the left C8 neural
foramen. Superimposed moderate facet and ligament flavum hypertrophy
greater on the left. No spinal stenosis. Mild right C8 foraminal
stenosis.

Similar multifactorial left eccentric T1 neural foraminal stenosis.
IMPRESSION: Solid ankylosis of C6-C7 with advanced disc and endplate
degeneration C3-C4 through C5-C6. Severe adjacent segment disease at
the latter.
Mild spinal stenosis at each of those levels with up to mild cord
mass effect but no cord signal abnormality.
Moderate or severe degenerative neural foraminal stenosis at the
bilateral C4, left C5, bilateral C6, left C8, and left T1 nerve
levels.

## 2021-12-25 ENCOUNTER — Emergency Department (HOSPITAL_COMMUNITY): Payer: No Typology Code available for payment source

## 2021-12-25 ENCOUNTER — Inpatient Hospital Stay (HOSPITAL_COMMUNITY)
Admission: EM | Admit: 2021-12-25 | Discharge: 2021-12-29 | DRG: 092 | Disposition: A | Discharge status: Home / Self Care | Payer: No Typology Code available for payment source | Attending: Internal Medicine | Admitting: Internal Medicine

## 2021-12-25 ENCOUNTER — Encounter (HOSPITAL_COMMUNITY): Payer: Self-pay | Admitting: Internal Medicine

## 2021-12-25 ENCOUNTER — Other Ambulatory Visit: Payer: Self-pay

## 2021-12-25 DIAGNOSIS — Z87891 Personal history of nicotine dependence: Secondary | ICD-10-CM

## 2021-12-25 DIAGNOSIS — F1120 Opioid dependence, uncomplicated: Secondary | ICD-10-CM | POA: Diagnosis present

## 2021-12-25 DIAGNOSIS — F01518 Vascular dementia, unspecified severity, with other behavioral disturbance: Secondary | ICD-10-CM | POA: Diagnosis present

## 2021-12-25 DIAGNOSIS — R748 Abnormal levels of other serum enzymes: Secondary | ICD-10-CM | POA: Diagnosis present

## 2021-12-25 DIAGNOSIS — Z79890 Hormone replacement therapy (postmenopausal): Secondary | ICD-10-CM

## 2021-12-25 DIAGNOSIS — Z8249 Family history of ischemic heart disease and other diseases of the circulatory system: Secondary | ICD-10-CM

## 2021-12-25 DIAGNOSIS — G894 Chronic pain syndrome: Secondary | ICD-10-CM | POA: Diagnosis present

## 2021-12-25 DIAGNOSIS — Z885 Allergy status to narcotic agent status: Secondary | ICD-10-CM

## 2021-12-25 DIAGNOSIS — G4700 Insomnia, unspecified: Secondary | ICD-10-CM | POA: Diagnosis present

## 2021-12-25 DIAGNOSIS — Z880 Allergy status to penicillin: Secondary | ICD-10-CM

## 2021-12-25 DIAGNOSIS — N179 Acute kidney failure, unspecified: Secondary | ICD-10-CM | POA: Diagnosis present

## 2021-12-25 DIAGNOSIS — Z7982 Long term (current) use of aspirin: Secondary | ICD-10-CM

## 2021-12-25 DIAGNOSIS — G4730 Sleep apnea, unspecified: Secondary | ICD-10-CM | POA: Diagnosis present

## 2021-12-25 DIAGNOSIS — G9340 Encephalopathy, unspecified: Secondary | ICD-10-CM | POA: Diagnosis not present

## 2021-12-25 DIAGNOSIS — Z833 Family history of diabetes mellitus: Secondary | ICD-10-CM

## 2021-12-25 DIAGNOSIS — F329 Major depressive disorder, single episode, unspecified: Secondary | ICD-10-CM | POA: Diagnosis present

## 2021-12-25 DIAGNOSIS — F03918 Unspecified dementia, unspecified severity, with other behavioral disturbance: Secondary | ICD-10-CM | POA: Diagnosis not present

## 2021-12-25 DIAGNOSIS — Z20822 Contact with and (suspected) exposure to covid-19: Secondary | ICD-10-CM | POA: Diagnosis present

## 2021-12-25 DIAGNOSIS — M6282 Rhabdomyolysis: Secondary | ICD-10-CM | POA: Diagnosis present

## 2021-12-25 DIAGNOSIS — G2581 Restless legs syndrome: Secondary | ICD-10-CM | POA: Diagnosis present

## 2021-12-25 DIAGNOSIS — G3183 Dementia with Lewy bodies: Secondary | ICD-10-CM | POA: Diagnosis present

## 2021-12-25 DIAGNOSIS — G629 Polyneuropathy, unspecified: Secondary | ICD-10-CM | POA: Diagnosis present

## 2021-12-25 DIAGNOSIS — F0284 Dementia in other diseases classified elsewhere, unspecified severity, with anxiety: Secondary | ICD-10-CM | POA: Diagnosis present

## 2021-12-25 DIAGNOSIS — F1320 Sedative, hypnotic or anxiolytic dependence, uncomplicated: Secondary | ICD-10-CM | POA: Diagnosis not present

## 2021-12-25 DIAGNOSIS — Z85828 Personal history of other malignant neoplasm of skin: Secondary | ICD-10-CM

## 2021-12-25 DIAGNOSIS — Z8673 Personal history of transient ischemic attack (TIA), and cerebral infarction without residual deficits: Secondary | ICD-10-CM

## 2021-12-25 DIAGNOSIS — G928 Other toxic encephalopathy: Secondary | ICD-10-CM | POA: Diagnosis not present

## 2021-12-25 DIAGNOSIS — F0153 Vascular dementia, unspecified severity, with mood disturbance: Secondary | ICD-10-CM | POA: Diagnosis present

## 2021-12-25 DIAGNOSIS — M2550 Pain in unspecified joint: Secondary | ICD-10-CM | POA: Diagnosis present

## 2021-12-25 DIAGNOSIS — F0283 Dementia in other diseases classified elsewhere, unspecified severity, with mood disturbance: Secondary | ICD-10-CM | POA: Diagnosis present

## 2021-12-25 DIAGNOSIS — E039 Hypothyroidism, unspecified: Secondary | ICD-10-CM | POA: Diagnosis present

## 2021-12-25 DIAGNOSIS — Z888 Allergy status to other drugs, medicaments and biological substances status: Secondary | ICD-10-CM

## 2021-12-25 DIAGNOSIS — Z8042 Family history of malignant neoplasm of prostate: Secondary | ICD-10-CM

## 2021-12-25 DIAGNOSIS — Z881 Allergy status to other antibiotic agents status: Secondary | ICD-10-CM

## 2021-12-25 DIAGNOSIS — N1832 Chronic kidney disease, stage 3b: Secondary | ICD-10-CM | POA: Diagnosis present

## 2021-12-25 DIAGNOSIS — R4701 Aphasia: Secondary | ICD-10-CM | POA: Diagnosis present

## 2021-12-25 DIAGNOSIS — T50915A Adverse effect of multiple unspecified drugs, medicaments and biological substances, initial encounter: Secondary | ICD-10-CM | POA: Diagnosis present

## 2021-12-25 DIAGNOSIS — R4182 Altered mental status, unspecified: Secondary | ICD-10-CM

## 2021-12-25 DIAGNOSIS — G4720 Circadian rhythm sleep disorder, unspecified type: Secondary | ICD-10-CM | POA: Diagnosis present

## 2021-12-25 DIAGNOSIS — M549 Dorsalgia, unspecified: Secondary | ICD-10-CM | POA: Diagnosis present

## 2021-12-25 DIAGNOSIS — Y92009 Unspecified place in unspecified non-institutional (private) residence as the place of occurrence of the external cause: Secondary | ICD-10-CM

## 2021-12-25 DIAGNOSIS — Z9049 Acquired absence of other specified parts of digestive tract: Secondary | ICD-10-CM

## 2021-12-25 DIAGNOSIS — Z981 Arthrodesis status: Secondary | ICD-10-CM

## 2021-12-25 DIAGNOSIS — F1210 Cannabis abuse, uncomplicated: Secondary | ICD-10-CM | POA: Diagnosis present

## 2021-12-25 DIAGNOSIS — Z79899 Other long term (current) drug therapy: Secondary | ICD-10-CM

## 2021-12-25 DIAGNOSIS — Z630 Problems in relationship with spouse or partner: Secondary | ICD-10-CM

## 2021-12-25 DIAGNOSIS — F0282 Dementia in other diseases classified elsewhere, unspecified severity, with psychotic disturbance: Secondary | ICD-10-CM | POA: Diagnosis present

## 2021-12-25 DIAGNOSIS — F02818 Dementia in other diseases classified elsewhere, unspecified severity, with other behavioral disturbance: Secondary | ICD-10-CM | POA: Diagnosis present

## 2021-12-25 DIAGNOSIS — Z800 Family history of malignant neoplasm of digestive organs: Secondary | ICD-10-CM

## 2021-12-25 DIAGNOSIS — F0154 Vascular dementia, unspecified severity, with anxiety: Secondary | ICD-10-CM | POA: Diagnosis present

## 2021-12-25 DIAGNOSIS — E785 Hyperlipidemia, unspecified: Secondary | ICD-10-CM | POA: Diagnosis present

## 2021-12-25 DIAGNOSIS — I129 Hypertensive chronic kidney disease with stage 1 through stage 4 chronic kidney disease, or unspecified chronic kidney disease: Secondary | ICD-10-CM | POA: Diagnosis present

## 2021-12-25 DIAGNOSIS — F0152 Vascular dementia, unspecified severity, with psychotic disturbance: Secondary | ICD-10-CM | POA: Diagnosis present

## 2021-12-25 LAB — TSH: TSH: 6.023 u[IU]/mL — ABNORMAL HIGH (ref 0.350–4.500)

## 2021-12-25 LAB — CBC
HCT: 40.4 % (ref 39.0–52.0)
Hemoglobin: 14.2 g/dL (ref 13.0–17.0)
MCH: 29.5 pg (ref 26.0–34.0)
MCHC: 35.1 g/dL (ref 30.0–36.0)
MCV: 84 fL (ref 80.0–100.0)
Platelets: 196 10*3/uL (ref 150–400)
RBC: 4.81 MIL/uL (ref 4.22–5.81)
RDW: 12.3 % (ref 11.5–15.5)
WBC: 12.4 10*3/uL — ABNORMAL HIGH (ref 4.0–10.5)
nRBC: 0 % (ref 0.0–0.2)

## 2021-12-25 LAB — COMPREHENSIVE METABOLIC PANEL
ALT: 33 U/L (ref 0–44)
AST: 79 U/L — ABNORMAL HIGH (ref 15–41)
Albumin: 4.7 g/dL (ref 3.5–5.0)
Alkaline Phosphatase: 64 U/L (ref 38–126)
Anion gap: 13 (ref 5–15)
BUN: 47 mg/dL — ABNORMAL HIGH (ref 8–23)
CO2: 21 mmol/L — ABNORMAL LOW (ref 22–32)
Calcium: 9.3 mg/dL (ref 8.9–10.3)
Chloride: 105 mmol/L (ref 98–111)
Creatinine, Ser: 1.6 mg/dL — ABNORMAL HIGH (ref 0.61–1.24)
GFR, Estimated: 46 mL/min — ABNORMAL LOW (ref >60)
Glucose, Bld: 129 mg/dL — ABNORMAL HIGH (ref 70–99)
Potassium: 3.5 mmol/L (ref 3.5–5.1)
Sodium: 139 mmol/L (ref 135–145)
Total Bilirubin: 2.1 mg/dL — ABNORMAL HIGH (ref 0.3–1.2)
Total Protein: 7.2 g/dL (ref 6.5–8.1)

## 2021-12-25 LAB — URINALYSIS, ROUTINE W REFLEX MICROSCOPIC
Bilirubin Urine: NEGATIVE
Glucose, UA: NEGATIVE mg/dL
Ketones, ur: 20 mg/dL — AB
Leukocytes,Ua: NEGATIVE
Nitrite: NEGATIVE
Protein, ur: 100 mg/dL — AB
Specific Gravity, Urine: 1.03 (ref 1.005–1.030)
pH: 5 (ref 5.0–8.0)

## 2021-12-25 LAB — I-STAT CHEM 8, ED
BUN: 44 mg/dL — ABNORMAL HIGH (ref 8–23)
Calcium, Ion: 1.15 mmol/L (ref 1.15–1.40)
Chloride: 105 mmol/L (ref 98–111)
Creatinine, Ser: 1.5 mg/dL — ABNORMAL HIGH (ref 0.61–1.24)
Glucose, Bld: 126 mg/dL — ABNORMAL HIGH (ref 70–99)
HCT: 41 % (ref 39.0–52.0)
Hemoglobin: 13.9 g/dL (ref 13.0–17.0)
Potassium: 3.4 mmol/L — ABNORMAL LOW (ref 3.5–5.1)
Sodium: 140 mmol/L (ref 135–145)
TCO2: 22 mmol/L (ref 22–32)

## 2021-12-25 LAB — DIFFERENTIAL
Abs Immature Granulocytes: 0.05 10*3/uL (ref 0.00–0.07)
Basophils Absolute: 0 10*3/uL (ref 0.0–0.1)
Basophils Relative: 0 %
Eosinophils Absolute: 0 10*3/uL (ref 0.0–0.5)
Eosinophils Relative: 0 %
Immature Granulocytes: 0 %
Lymphocytes Relative: 10 %
Lymphs Abs: 1.2 10*3/uL (ref 0.7–4.0)
Monocytes Absolute: 1.3 10*3/uL — ABNORMAL HIGH (ref 0.1–1.0)
Monocytes Relative: 10 %
Neutro Abs: 9.9 10*3/uL — ABNORMAL HIGH (ref 1.7–7.7)
Neutrophils Relative %: 80 %

## 2021-12-25 LAB — RESP PANEL BY RT-PCR (FLU A&B, COVID) ARPGX2
Influenza A by PCR: NEGATIVE
Influenza B by PCR: NEGATIVE
SARS Coronavirus 2 by RT PCR: NEGATIVE

## 2021-12-25 LAB — RAPID URINE DRUG SCREEN, HOSP PERFORMED
Amphetamines: NOT DETECTED
Barbiturates: NOT DETECTED
Benzodiazepines: NOT DETECTED
Cocaine: NOT DETECTED
Opiates: NOT DETECTED
Tetrahydrocannabinol: POSITIVE — AB

## 2021-12-25 LAB — LIPASE, BLOOD: Lipase: 26 U/L (ref 11–51)

## 2021-12-25 LAB — PROTIME-INR
INR: 1.1 (ref 0.8–1.2)
Prothrombin Time: 14.5 seconds (ref 11.4–15.2)

## 2021-12-25 LAB — HIV ANTIBODY (ROUTINE TESTING W REFLEX): HIV Screen 4th Generation wRfx: NONREACTIVE

## 2021-12-25 LAB — CK: Total CK: 2300 U/L — ABNORMAL HIGH (ref 49–397)

## 2021-12-25 LAB — AMMONIA: Ammonia: 21 umol/L (ref 9–35)

## 2021-12-25 LAB — APTT: aPTT: 25 seconds (ref 24–36)

## 2021-12-25 LAB — ETHANOL: Alcohol, Ethyl (B): <10 mg/dL (ref <10)

## 2021-12-25 MED ORDER — LORAZEPAM 2 MG/ML IJ SOLN
1.0000 mg | INTRAMUSCULAR | Status: DC | PRN
  Administered 2021-12-26: 2 mg via INTRAVENOUS
  Administered 2021-12-26: 1 mg via INTRAVENOUS
  Filled 2021-12-25 (×2): qty 1

## 2021-12-25 MED ORDER — ADULT MULTIVITAMIN W/MINERALS CH
1.0000 | ORAL_TABLET | Freq: Every day | ORAL | Status: DC
  Administered 2021-12-25 – 2021-12-29 (×5): 1 via ORAL
  Filled 2021-12-25 (×5): qty 1

## 2021-12-25 MED ORDER — LACTATED RINGERS IV SOLN: INTRAVENOUS | Status: DC

## 2021-12-25 MED ORDER — LORAZEPAM 2 MG/ML IJ SOLN
1.0000 mg | Freq: Once | INTRAMUSCULAR | Status: AC | PRN
  Administered 2021-12-25: 1 mg via INTRAVENOUS
  Filled 2021-12-25: qty 1

## 2021-12-25 MED ORDER — LACTATED RINGERS IV BOLUS
1000.0000 mL | Freq: Once | INTRAVENOUS | Status: AC
  Administered 2021-12-25: 1000 mL via INTRAVENOUS

## 2021-12-25 MED ORDER — LORAZEPAM 2 MG/ML IJ SOLN: 0.5000 mg | Freq: Once | INTRAMUSCULAR | Status: DC

## 2021-12-25 MED ORDER — THIAMINE HCL 100 MG/ML IJ SOLN
100.0000 mg | Freq: Every day | INTRAMUSCULAR | Status: DC
  Administered 2021-12-25: 100 mg via INTRAVENOUS
  Filled 2021-12-25 (×3): qty 2

## 2021-12-25 MED ORDER — ONDANSETRON HCL 4 MG/2ML IJ SOLN: 4.0000 mg | Freq: Four times a day (QID) | INTRAMUSCULAR | Status: DC | PRN

## 2021-12-25 MED ORDER — LORAZEPAM 1 MG PO TABS
1.0000 mg | ORAL_TABLET | ORAL | Status: DC | PRN
  Administered 2021-12-26 (×3): 1 mg via ORAL
  Administered 2021-12-26 – 2021-12-27 (×2): 2 mg via ORAL
  Filled 2021-12-25: qty 2
  Filled 2021-12-25: qty 1
  Filled 2021-12-25: qty 2
  Filled 2021-12-25 (×2): qty 1

## 2021-12-25 MED ORDER — ACETAMINOPHEN 650 MG RE SUPP: 650.0000 mg | Freq: Four times a day (QID) | RECTAL | Status: DC | PRN

## 2021-12-25 MED ORDER — FOLIC ACID 1 MG PO TABS
1.0000 mg | ORAL_TABLET | Freq: Every day | ORAL | Status: DC
  Administered 2021-12-25 – 2021-12-29 (×5): 1 mg via ORAL
  Filled 2021-12-25 (×5): qty 1

## 2021-12-25 MED ORDER — ONDANSETRON HCL 4 MG PO TABS: 4.0000 mg | ORAL_TABLET | Freq: Four times a day (QID) | ORAL | Status: DC | PRN

## 2021-12-25 MED ORDER — THIAMINE HCL 100 MG PO TABS
100.0000 mg | ORAL_TABLET | Freq: Every day | ORAL | Status: DC
  Administered 2021-12-26 – 2021-12-29 (×4): 100 mg via ORAL
  Filled 2021-12-25 (×4): qty 1

## 2021-12-25 MED ORDER — LORAZEPAM 2 MG/ML IJ SOLN
1.0000 mg | Freq: Once | INTRAMUSCULAR | Status: AC
  Administered 2021-12-25: 1 mg via INTRAVENOUS
  Filled 2021-12-25: qty 1

## 2021-12-25 MED ORDER — ENOXAPARIN SODIUM 40 MG/0.4ML IJ SOSY
40.0000 mg | PREFILLED_SYRINGE | INTRAMUSCULAR | Status: DC
  Administered 2021-12-25 – 2021-12-27 (×3): 40 mg via SUBCUTANEOUS
  Filled 2021-12-25 (×4): qty 0.4

## 2021-12-25 MED ORDER — ACETAMINOPHEN 325 MG PO TABS
650.0000 mg | ORAL_TABLET | Freq: Four times a day (QID) | ORAL | Status: DC | PRN
  Administered 2021-12-26: 650 mg via ORAL
  Filled 2021-12-25: qty 2

## 2021-12-25 MED ORDER — ENSURE ENLIVE PO LIQD
237.0000 mL | Freq: Two times a day (BID) | ORAL | Status: DC
  Administered 2021-12-26 – 2021-12-29 (×6): 237 mL via ORAL

## 2021-12-25 NOTE — Assessment & Plan Note (Addendum)
Suspect toxic metabolic encephalopathy, possibly related to polysubstance use / abuse in setting of AKI. ?Question of benzo withdrawal ?1. Hold home meds ?2. CIWA ?1. Ativan seemed to improve mental status in ED. ?3. Check TSH, ammonia ?4. Treat AKI ? ?

## 2021-12-25 NOTE — ED Triage Notes (Signed)
Pt BIB [facility] from home - lives alone, LKW 3/14 or 3/15. Son in law trying to get in touch w pt x days. Today son in law came to check on pt and noted "medical alarm blaring" and pt aphasic, unsteady gait and disoriented. Pt is baseline oriented with history of dementia but minimal symptoms. EMS noted R neglect.  ? ?158/80 76HR 20RR 99% on RA, CBG 134 ?

## 2021-12-25 NOTE — H&P (Signed)
?History and Physical  ? ? ?Patient: Hayden Rose MVE:[id number] DOB: [birth year] ?DOA: 12/25/2021 ?DOS: the patient was seen and examined on 12/25/2021 ?PCP: [facility]  ?Patient coming from: Home ? ?Chief Complaint:  ?Chief Complaint  ?Patient presents with  ? Altered Mental Status  ? ?HPI: VALOR QUAINTANCE is a 71 y.o. male with medical history significant of vascular / lewy body dementia, CKD 3, prior opiate dependence, current benzo dependence, current THC use, hypothyroidism. ? ?Pt presents to ED today due to AMS. ? ?Patient's son-in-law is at bedside who provides most of the history.  He states that patient is typically A&O x3 and has no difficulty with his activities of daily living.  He was last seen well about 5 days ago while at church.  He states that they received a notification that his alarm was going off so he went to his home.  He found him wandering around the house confused with a flashlight.  At the time he was providing no clear answers to questions or following commands.  States that he currently seems mildly improved.  States that over the past few months he has been exhibiting signs of more aggressive behavior so much so that his wife had to move out of the home because of this.  He does note that at times that he will become confused but states this is not a chronic issue.  States that he believes that he uses marijuana as well from time to time due to chronic joint pain from his prior [employer]. ? ?Review of Systems: unable to review all systems due to the inability of the patient to answer questions. ?Past Medical History:  ?Diagnosis Date  ? Anxiety   ? Arthritis   ? Back pain   ? chronic  ? Chronic kidney disease   ? stage III  renal disease   ? Degenerative disc disease   ? Depression   ? Dizziness   ? Dupuytren's contracture of right hand   ? Dyspnea   ? Elevated LFTs   ? Erectile dysfunction   ? Fall at home 08/12/2020  ? Hypothyroidism   ? Jaundice with stoppage of bile  flow   ? after stent romoved  ? Lewy body dementia ([place])   ? Memory deficits 04/23/2013  ? Orthostatic hypotension   ? Osgood-Schlatter's disease   ? Restless leg syndrome 04/23/2013  ? Skin cancer   ? basal cell on back x2  ? Sleep apnea with use of continuous positive airway pressure (CPAP)   ? Tachycardia   ? heart monitor last week for 48 hours no report yet  ? ?Past Surgical History:  ?Procedure Laterality Date  ? APPENDECTOMY    ? BASAL CELL CARCINOMA EXCISION  2008,2009  ? CARPAL TUNNEL RELEASE  2003  ? CERVICAL FUSION  1985  ? CHOLECYSTECTOMY    ? GALLBLADDER SURGERY  2007-2008  ? LIVER BIOPSY  12/27/2011  ? Procedure: LIVER BIOPSY;  Surgeon: Inda Castle, MD;  Location: [facility];  Service: Endoscopy;  Laterality: N/A;  pt moved from 3/22 to 3/18 ( aw)  ? [place]  ? TONSILLECTOMY    ? ?Social History:  reports that he quit smoking about 46 years ago. He has never used smokeless tobacco. He reports that he does not drink alcohol and does not use drugs. ? ?Allergies  ?Allergen Reactions  ? Vesicare [Solifenacin Succinate] Anaphylaxis  ? Sulfa Antibiotics Swelling  ? Aricept [Donepezil Hydrochloride] Nausea  And Vomiting and Other (See Comments)  ? Elemental Sulfur Swelling  ? Gabapentin   ? Lamictal [Lamotrigine]   ?  Petechia  ? Namenda [Memantine Hcl] Other (See Comments)  ?  unknown  ? Exelon [Rivastigmine] Rash  ?  patch  ? Fentanyl Rash  ?  patch  ? Penicillins Rash  ? ? ?Family History  ?Problem Relation Age of Onset  ? Coronary artery disease Mother   ? Heart attack Mother   ?     stents in neck and heart ?  ? Crohn's disease Mother   ? Cirrhosis Father   ? Cirrhosis Sister   ? Prostate cancer Maternal Uncle   ? Liver cancer Cousin   ? Colitis Daughter   ? Diabetes Maternal Uncle   ? Diabetes Brother   ? Kidney disease Maternal Aunt   ? Kidney disease Cousin   ? ? ?Prior to Admission medications   ?Medication Sig Start Date End Date Taking? Authorizing Provider  ?acetaminophen (TYLENOL)  500 MG tablet Take 2 tablets (1,000 mg total) by mouth every 6 (six) hours as needed. 08/14/20   Jill Alexanders, PA-C  ?ALPRAZolam (XANAX) 1 MG tablet Take 1 mg by mouth at bedtime.    [provider]  ?aspirin 81 MG chewable tablet Chew 1 tablet (81 mg total) by mouth daily. ?Patient not taking: Reported on 08/11/2020 09/11/15   Maryellen Pile, MD  ?atorvastatin (LIPITOR) 80 MG tablet Take 1 tablet (80 mg total) by mouth daily at 6 PM. ?Patient not taking: Reported on 09/30/2015 09/11/15   Maryellen Pile, MD  ?carbidopa-levodopa (SINEMET IR) 25-100 MG tablet Take 2 tablets by mouth daily.    [provider]  ?Carboxymethylcellulose Sod PF 1 % GEL Place 1 drop into both eyes at bedtime.    [provider]  ?colestipol (COLESTID) 1 g tablet Take 1 g by mouth 2 (two) times daily.    [provider]  ?diphenoxylate-atropine (LOMOTIL) 2.5-0.025 MG tablet Take 1 tablet by mouth in the morning and at bedtime.    [provider]  ?DULoxetine (CYMBALTA) 30 MG capsule Take 30 mg by mouth 2 (two) times daily.    [provider]  ?hydrochlorothiazide (HYDRODIURIL) 25 MG tablet Take 25 mg by mouth daily.    [provider]  ?levothyroxine (SYNTHROID) 137 MCG tablet Take 137 mcg by mouth daily before breakfast.    [provider]  ?Multiple Vitamin (MULTIVITAMIN WITH MINERALS) TABS tablet Take 1 tablet by mouth daily.    [provider]  ?pregabalin (LYRICA) 150 MG capsule Take 150 mg by mouth 2 (two) times daily.    [provider]  ?PRESCRIPTION MEDICATION Take 1 capsule by mouth daily. Medication: Naltrexone    [provider]  ?QUEtiapine (SEROQUEL) 100 MG tablet Take 100 mg by mouth at bedtime.    [provider]  ?risperiDONE (RISPERDAL) 0.5 MG tablet Take 1 tablet (0.5 mg total) by mouth 2 (two) times daily. ?Patient not taking: Reported on 08/11/2020 03/25/18   Patrecia Pour, NP  ?rOPINIRole (REQUIP) 0.25 MG  tablet Take 1 tablet (0.25 mg total) by mouth at bedtime. ?Patient not taking: Reported on 08/11/2020 03/25/18   Patrecia Pour, NP  ?terazosin (HYTRIN) 1 MG capsule Take 1 mg by mouth at bedtime.    [provider]  ? ? ?Physical Exam: ?Vitals:  ? 12/25/21 2045 12/25/21 2100 12/25/21 2115 12/25/21 2130  ?BP: (!) 149/88 (!) 143/75 134/72 (!) 149/65  ?Pulse:  65 62 (!) 54 76  ?Resp: '12 15 11 18  '$ ?Temp:      ?TempSrc:      ?SpO2: 100% 100% 98% 96%  ?Weight:      ?Height:      ? ?Constitutional: NAD, calm, comfortable ?Eyes: PERRL, lid-lag present ?ENMT: Mucous membranes are moist. Posterior pharynx clear of any exudate or lesions.Normal dentition.  ?Neck: normal, supple, no masses, no thyromegaly ?Respiratory: clear to auscultation bilaterally, no wheezing, no crackles. Normal respiratory effort. No accessory muscle use.  ?Cardiovascular: Regular rate and rhythm, no murmurs / rubs / gallops. No extremity edema. 2+ pedal pulses. No carotid bruits.  ?Abdomen: no tenderness, no masses palpated. No hepatosplenomegaly. Bowel sounds positive.  ?Musculoskeletal: no clubbing / cyanosis. No joint deformity upper and lower extremities. Good ROM, no contractures. Normal muscle tone.  ?Skin: no rashes, lesions, ulcers. No induration ?Neurologic: MAE, no gross motor deficits, can answer yes/no questions, not much else. ?Psychiatric: alert, aphasic, not really able to answer orientation questions. ? ?Data Reviewed: ? ?CBC ?   ?Component Value Date/Time  ? WBC 12.4 (H) 12/25/2021 1520  ? RBC 4.81 12/25/2021 1520  ? HGB 13.9 12/25/2021 1541  ? HCT 41.0 12/25/2021 1541  ? PLT 196 12/25/2021 1520  ? MCV 84.0 12/25/2021 1520  ? MCH 29.5 12/25/2021 1520  ? MCHC 35.1 12/25/2021 1520  ? RDW 12.3 12/25/2021 1520  ? LYMPHSABS 1.2 12/25/2021 1520  ? MONOABS 1.3 (H) 12/25/2021 1520  ? EOSABS 0.0 12/25/2021 1520  ? BASOSABS 0.0 12/25/2021 1520  ? ?CMP  ?   ?Component Value Date/Time  ? NA 140 12/25/2021 1541  ? K 3.4 (L) 12/25/2021 1541   ? CL 105 12/25/2021 1541  ? CO2 21 (L) 12/25/2021 1520  ? GLUCOSE 126 (H) 12/25/2021 1541  ? GLUCOSE 101 (H) 10/18/2006 0926  ? BUN 44 (H) 12/25/2021 1541  ? CREATININE 1.50 (H) 12/25/2021 1541  ? CALCI

## 2021-12-25 NOTE — Assessment & Plan Note (Addendum)
Chronic benzo use ?Holding home meds ?benzos available via CIWA if needed. ?

## 2021-12-25 NOTE — ED Notes (Signed)
Pt with expressive aphasia but otherwise asymptomatic. Son in law at bedside at this time. ?

## 2021-12-25 NOTE — Assessment & Plan Note (Addendum)
With h/o opiate dependence in past, benzo dependence currently. ?Also THC use/abuse. ?

## 2021-12-25 NOTE — Assessment & Plan Note (Signed)
Looks like the [place] has him off of opiates at this point based on their med rec from last month. ?UDS neg. ?

## 2021-12-25 NOTE — Assessment & Plan Note (Signed)
Looks to have mild pre-renal AKI ?1. IVF ?2. Repeat CPK in AM ?3. Strict intake and output ?4. Repeat BMP in AM ?

## 2021-12-25 NOTE — ED Provider Notes (Signed)
?[facility] ?Provider Note ? ? ?CSN: [id number] ?Arrival date & time: 12/25/21  1450 ? ?  ? ?History ? ?Chief Complaint  ?Patient presents with  ? Altered Mental Status  ? ? ?Hayden Rose is a 71 y.o. male. ? ?HPI ?Patient is a 71 year old male with a history of memory deficits, acute encephalopathy, facial droop, benzodiazepine dependence, polypharmacy, opioid use disorder, dementia with behavioral disturbance, who presents to the emergency department due to altered mental status.  Patient currently following commands and giving a "thumbs up" when asked questions but does not provide clear answers to questions. ? ?Level 5 caveat due due to mental status change ? ?Patient's son-in-law is at bedside who provides most of the history.  He states that patient is typically A&O x3 and has no difficulty with his activities of daily living.  He was last seen well about 5 days ago while at church.  He states that they received a notification that his alarm was going off so he went to his home.  He found him wandering around the house confused with a flashlight.  At the time he was providing no clear answers to questions or following commands.  States that he currently seems mildly improved.  States that over the past few months he has been exhibiting signs of more aggressive behavior so much so that his wife had to move out of the home because of this.  He does note that at times that he will become confused but states this is not a chronic issue.  States that he believes that he uses marijuana as well from time to time due to chronic joint pain from his prior [employer]. ?  ? ?Home Medications ?Prior to Admission medications   ?Medication Sig Start Date End Date Taking? Authorizing Provider  ?acetaminophen (TYLENOL) 500 MG tablet Take 2 tablets (1,000 mg total) by mouth every 6 (six) hours as needed. 08/14/20   Jill Alexanders, PA-C  ?ALPRAZolam (XANAX) 1 MG tablet Take 1 mg  by mouth at bedtime.    [provider]  ?aspirin 81 MG chewable tablet Chew 1 tablet (81 mg total) by mouth daily. ?Patient not taking: Reported on 08/11/2020 09/11/15   Maryellen Pile, MD  ?atorvastatin (LIPITOR) 80 MG tablet Take 1 tablet (80 mg total) by mouth daily at 6 PM. ?Patient not taking: Reported on 09/30/2015 09/11/15   Maryellen Pile, MD  ?carbidopa-levodopa (SINEMET IR) 25-100 MG tablet Take 2 tablets by mouth daily.    [provider]  ?Carboxymethylcellulose Sod PF 1 % GEL Place 1 drop into both eyes at bedtime.    [provider]  ?colestipol (COLESTID) 1 g tablet Take 1 g by mouth 2 (two) times daily.    [provider]  ?diphenoxylate-atropine (LOMOTIL) 2.5-0.025 MG tablet Take 1 tablet by mouth in the morning and at bedtime.    [provider]  ?DULoxetine (CYMBALTA) 30 MG capsule Take 30 mg by mouth 2 (two) times daily.    [provider]  ?hydrochlorothiazide (HYDRODIURIL) 25 MG tablet Take 25 mg by mouth daily.    [provider]  ?levothyroxine (SYNTHROID) 137 MCG tablet Take 137 mcg by mouth daily before breakfast.    [provider]  ?Multiple Vitamin (MULTIVITAMIN WITH MINERALS) TABS tablet Take 1 tablet by mouth daily.    [provider]  ?pregabalin (LYRICA) 150 MG capsule Take 150 mg by mouth 2 (two) times daily.    [provider]  ?PRESCRIPTION MEDICATION Take 1 capsule by mouth daily. Medication: Naltrexone    [provider]  ?QUEtiapine (SEROQUEL) 100 MG tablet Take 100 mg by mouth at bedtime.    [provider]  ?risperiDONE (RISPERDAL) 0.5 MG tablet Take 1 tablet (0.5 mg total) by mouth 2 (two) times daily. ?Patient not taking: Reported on 08/11/2020 03/25/18   Patrecia Pour, NP  ?rOPINIRole (REQUIP) 0.25 MG tablet Take 1 tablet (0.25 mg total) by mouth at bedtime. ?Patient not taking: Reported on 08/11/2020 03/25/18   Patrecia Pour, NP  ?terazosin (HYTRIN) 1 MG capsule  Take 1 mg by mouth at bedtime.    [provider]  ?   ? ?Allergies    ?Vesicare [solifenacin succinate], Sulfa antibiotics, Aricept [donepezil hydrochloride], Elemental sulfur, Gabapentin, Lamictal [lamotrigine], Namenda [memantine hcl], Exelon [rivastigmine], Fentanyl, and Penicillins   ? ?Review of Systems   ?Review of Systems  ?Unable to perform ROS: Mental status change  ? ?Physical Exam ?Updated Vital Signs ?BP (!) 144/76   Pulse 73   Temp 98 ?F (36.7 ?C)   Resp 17   Ht '5\' 8"'$  (1.727 m)   Wt 68 kg   SpO2 100%   BMI 22.81 kg/m?  ?Physical Exam ?Vitals and nursing note reviewed.  ?Constitutional:   ?   General: He is not in acute distress. ?   Appearance: Normal appearance. He is not ill-appearing, toxic-appearing or diaphoretic.  ?HENT:  ?   Head: Normocephalic and atraumatic.  ?   Right Ear: External ear normal.  ?   Left Ear: External ear normal.  ?   Nose: Nose normal.  ?   Mouth/Throat:  ?   Mouth: Mucous membranes are moist.  ?   Pharynx: Oropharynx is clear. No oropharyngeal exudate or posterior oropharyngeal erythema.  ?Eyes:  ?   General: No scleral icterus.    ?   Right eye: No discharge.     ?   Left eye: No discharge.  ?   Extraocular Movements: Extraocular movements intact.  ?   Conjunctiva/sclera: Conjunctivae normal.  ?Cardiovascular:  ?   Rate and Rhythm: Normal rate and regular rhythm.  ?   Pulses: Normal pulses.  ?   Heart sounds: Normal heart sounds. No murmur heard. ?  No friction rub. No gallop.  ?Pulmonary:  ?   Effort: Pulmonary effort is normal. No respiratory distress.  ?   Breath sounds: Normal breath sounds. No stridor. No wheezing, rhonchi or rales.  ?Abdominal:  ?   General: Abdomen is flat.  ?   Palpations: Abdomen is soft.  ?   Tenderness: There is no abdominal tenderness.  ?Musculoskeletal:     ?   General: Normal range of motion.  ?   Cervical back: Normal range of motion and neck supple. No tenderness.  ?Skin: ?   General: Skin is warm and dry.  ?Neurological:  ?    Mental Status: He is alert.  ?   Comments: Moving all 4 extremities.  Following commands.  Strength is 5 out of 5 in all 4 extremities.  No gross deficits.  No facial droop.  Patient answering questions by "saying yet and giving a thumbs up".  Otherwise does not provide any clear answers to questions.  Appears consistent with expressive aphasia.  ?Psychiatric:     ?   Mood and Affect: Mood normal.     ?   Behavior: Behavior normal.  ? ?ED Results / Procedures / Treatments   ?  Labs ?(all labs ordered are listed, but only abnormal results are displayed) ?Labs Reviewed  ?CBC - Abnormal; Notable for the following components:  ?    Result Value  ? WBC 12.4 (*)   ? All other components within normal limits  ?DIFFERENTIAL - Abnormal; Notable for the following components:  ? Neutro Abs 9.9 (*)   ? Monocytes Absolute 1.3 (*)   ? All other components within normal limits  ?COMPREHENSIVE METABOLIC PANEL - Abnormal; Notable for the following components:  ? CO2 21 (*)   ? Glucose, Bld 129 (*)   ? BUN 47 (*)   ? Creatinine, Ser 1.60 (*)   ? AST 79 (*)   ? Total Bilirubin 2.1 (*)   ? GFR, Estimated 46 (*)   ? All other components within normal limits  ?RAPID URINE DRUG SCREEN, HOSP PERFORMED - Abnormal; Notable for the following components:  ? Tetrahydrocannabinol POSITIVE (*)   ? All other components within normal limits  ?URINALYSIS, ROUTINE W REFLEX MICROSCOPIC - Abnormal; Notable for the following components:  ? APPearance HAZY (*)   ? Hgb urine dipstick MODERATE (*)   ? Ketones, ur 20 (*)   ? Protein, ur 100 (*)   ? Bacteria, UA RARE (*)   ? All other components within normal limits  ?CK - Abnormal; Notable for the following components:  ? Total CK 2,300 (*)   ? All other components within normal limits  ?I-STAT CHEM 8, ED - Abnormal; Notable for the following components:  ? Potassium 3.4 (*)   ? BUN 44 (*)   ? Creatinine, Ser 1.50 (*)   ? Glucose, Bld 126 (*)   ? All other components within normal limits  ?RESP PANEL BY  RT-PCR (FLU A&B, COVID) ARPGX2  ?ETHANOL  ?PROTIME-INR  ?APTT  ?LIPASE, BLOOD  ?AMMONIA  ? ?EKG ?None ? ?Radiology ?CT ABDOMEN PELVIS WO CONTRAST ? ?Result Date: 12/25/2021 ?CLINICAL DATA:  Abdominal pain and altere

## 2021-12-26 ENCOUNTER — Observation Stay (HOSPITAL_COMMUNITY): Payer: No Typology Code available for payment source

## 2021-12-26 DIAGNOSIS — I129 Hypertensive chronic kidney disease with stage 1 through stage 4 chronic kidney disease, or unspecified chronic kidney disease: Secondary | ICD-10-CM | POA: Diagnosis present

## 2021-12-26 DIAGNOSIS — G928 Other toxic encephalopathy: Secondary | ICD-10-CM | POA: Diagnosis present

## 2021-12-26 DIAGNOSIS — F0284 Dementia in other diseases classified elsewhere, unspecified severity, with anxiety: Secondary | ICD-10-CM | POA: Diagnosis present

## 2021-12-26 DIAGNOSIS — G9340 Encephalopathy, unspecified: Secondary | ICD-10-CM | POA: Diagnosis present

## 2021-12-26 DIAGNOSIS — F0153 Vascular dementia, unspecified severity, with mood disturbance: Secondary | ICD-10-CM | POA: Diagnosis present

## 2021-12-26 DIAGNOSIS — F03918 Unspecified dementia, unspecified severity, with other behavioral disturbance: Secondary | ICD-10-CM | POA: Diagnosis not present

## 2021-12-26 DIAGNOSIS — F1320 Sedative, hypnotic or anxiolytic dependence, uncomplicated: Secondary | ICD-10-CM | POA: Diagnosis present

## 2021-12-26 DIAGNOSIS — F1920 Other psychoactive substance dependence, uncomplicated: Secondary | ICD-10-CM | POA: Diagnosis not present

## 2021-12-26 DIAGNOSIS — N1832 Chronic kidney disease, stage 3b: Secondary | ICD-10-CM | POA: Diagnosis present

## 2021-12-26 DIAGNOSIS — G629 Polyneuropathy, unspecified: Secondary | ICD-10-CM | POA: Diagnosis present

## 2021-12-26 DIAGNOSIS — F01518 Vascular dementia, unspecified severity, with other behavioral disturbance: Secondary | ICD-10-CM | POA: Diagnosis present

## 2021-12-26 DIAGNOSIS — F02818 Dementia in other diseases classified elsewhere, unspecified severity, with other behavioral disturbance: Secondary | ICD-10-CM | POA: Diagnosis present

## 2021-12-26 DIAGNOSIS — Y92009 Unspecified place in unspecified non-institutional (private) residence as the place of occurrence of the external cause: Secondary | ICD-10-CM | POA: Diagnosis not present

## 2021-12-26 DIAGNOSIS — F1210 Cannabis abuse, uncomplicated: Secondary | ICD-10-CM | POA: Diagnosis present

## 2021-12-26 DIAGNOSIS — F0152 Vascular dementia, unspecified severity, with psychotic disturbance: Secondary | ICD-10-CM | POA: Diagnosis present

## 2021-12-26 DIAGNOSIS — F1120 Opioid dependence, uncomplicated: Secondary | ICD-10-CM | POA: Diagnosis not present

## 2021-12-26 DIAGNOSIS — F0283 Dementia in other diseases classified elsewhere, unspecified severity, with mood disturbance: Secondary | ICD-10-CM | POA: Diagnosis present

## 2021-12-26 DIAGNOSIS — G2581 Restless legs syndrome: Secondary | ICD-10-CM | POA: Diagnosis present

## 2021-12-26 DIAGNOSIS — R4701 Aphasia: Secondary | ICD-10-CM | POA: Diagnosis present

## 2021-12-26 DIAGNOSIS — E785 Hyperlipidemia, unspecified: Secondary | ICD-10-CM | POA: Diagnosis present

## 2021-12-26 DIAGNOSIS — Z9049 Acquired absence of other specified parts of digestive tract: Secondary | ICD-10-CM | POA: Diagnosis not present

## 2021-12-26 DIAGNOSIS — M6282 Rhabdomyolysis: Secondary | ICD-10-CM | POA: Diagnosis present

## 2021-12-26 DIAGNOSIS — R4182 Altered mental status, unspecified: Secondary | ICD-10-CM | POA: Diagnosis present

## 2021-12-26 DIAGNOSIS — F0282 Dementia in other diseases classified elsewhere, unspecified severity, with psychotic disturbance: Secondary | ICD-10-CM | POA: Diagnosis present

## 2021-12-26 DIAGNOSIS — F329 Major depressive disorder, single episode, unspecified: Secondary | ICD-10-CM | POA: Diagnosis present

## 2021-12-26 DIAGNOSIS — F0154 Vascular dementia, unspecified severity, with anxiety: Secondary | ICD-10-CM | POA: Diagnosis present

## 2021-12-26 DIAGNOSIS — G3183 Dementia with Lewy bodies: Secondary | ICD-10-CM | POA: Diagnosis present

## 2021-12-26 DIAGNOSIS — F1990 Other psychoactive substance use, unspecified, uncomplicated: Secondary | ICD-10-CM | POA: Diagnosis not present

## 2021-12-26 DIAGNOSIS — N179 Acute kidney failure, unspecified: Secondary | ICD-10-CM | POA: Diagnosis present

## 2021-12-26 DIAGNOSIS — E039 Hypothyroidism, unspecified: Secondary | ICD-10-CM | POA: Diagnosis present

## 2021-12-26 DIAGNOSIS — Z79899 Other long term (current) drug therapy: Secondary | ICD-10-CM | POA: Diagnosis not present

## 2021-12-26 DIAGNOSIS — Z20822 Contact with and (suspected) exposure to covid-19: Secondary | ICD-10-CM | POA: Diagnosis present

## 2021-12-26 LAB — BASIC METABOLIC PANEL
Anion gap: 8 (ref 5–15)
BUN: 45 mg/dL — ABNORMAL HIGH (ref 8–23)
CO2: 25 mmol/L (ref 22–32)
Calcium: 9 mg/dL (ref 8.9–10.3)
Chloride: 106 mmol/L (ref 98–111)
Creatinine, Ser: 1.46 mg/dL — ABNORMAL HIGH (ref 0.61–1.24)
GFR, Estimated: 52 mL/min — ABNORMAL LOW (ref >60)
Glucose, Bld: 93 mg/dL (ref 70–99)
Potassium: 3.6 mmol/L (ref 3.5–5.1)
Sodium: 139 mmol/L (ref 135–145)

## 2021-12-26 LAB — CK: Total CK: 2648 U/L — ABNORMAL HIGH (ref 49–397)

## 2021-12-26 NOTE — Consult Note (Signed)
Neurology Consultation ? ?Reason for Consult: Acute encephalopathy ?Referring Physician: Dr. Sloan Leiter ? ?CC: Patient does not provide a chief complaint due to current patient condition with minimal to no vocalization  ? ?History is obtained from: Chart review, patient's daughter at bedside ? ?HPI: Hayden Rose is a 71 y.o. male with a past medical history significant for reported vascular dementia, restless leg syndrome, remote cerebellar CVA, peripheral neuropathic pain, multiple psychiatric disorders, chronic back pain with previous opioid use disorder and current reported benzodiazepine use, major depressive disorder, CKD stage III, and THC use who presented to the ED on 3/17 for evaluation of acute confusion after being found altered by his family. Patient's family states that they last saw him in his usual state of health on Sunday, December 20, 2021 while at church. They state that his nights and days are often switched and they are unable to tell whether or not he takes his medications appropriately. He also has reported "manic" episodes where the family calls him "wild man" where is hyper and agitated at times and at other times, crashes and sleeps for days at a time. His daughter attemped to call him on Tuesday, March 14, but he did not answer which is not abnormal given his sleep derangements. Yesterday, patient's wife contacted her daughter and stated that the home alarm was going off at Hayden Rose home and he had not turned it off and she was asked to go check on him. Police were sent to the home but due to paranoia, he did not allow them inside of the house. Patient's son-in-law entered the house and states that all of the lights were on even though it was mid-morning and the patient was walking around with a flashlight with confusion and not answering questions or following commands. After a few hours without improvement, Hayden Rose was convinced to be evaluated in the hospital.  ? ?In December of 2022,  [facility] who noted that the patient does not have a primary neurocognitive disorder and felt that his primary issues were related to marital conflicts and psychological issues with ongoing issues with depression. At a more recent evaluation on 12/10/2020, there was some documented concern by outpatient [facility] provider for a possible crisis in the setting of aggression, MDD, and increased stress.  ? ?Of note, the patient has reportedly had multiple behavioral health concerns in the past as far back as the 1990's with multiple inpatient admissions, the last was reported in 2018 at the [place] where the patient spent 3 months inpatient. The patient reported to the family that if they ever involuntarily committed him again, he would harm himself. After medication adjustements and being taken off of his opiate medications, patient's daughter states that the patient "came out of it" in 2020 and regained his independence. During this time, he was able to reobtain his driver's license, lost weight, and became more active.  the patient and his wife have been separated since last fall (2022) when his wife moved out due to not feeling safe at home. The patient started to have a decline in mental status with family reporting "manic" episodes with aggression, obsession, paranoia, and sexual obsessions last spring/summer but that these worsened leading into the fall to where his wife was no longer feeling safe. The patient has made multiple comments about the [facility]" in the past in addition to feeling like his wife was trying to kill him if she  attempted to assist him with medication management.   ? ?Per outpatient behavioral neurologist that has been following the patient longitudinally, note from 09/14/2021: ?"He reports continued emotional grief and stress from his wife leaving him. He  ?denies cognitive or memory symptoms at this time. He is perseverative about his  ?wife  leaving and the emotional pain he feels at that.  ? ?He reports that he is safe and looking forward to our visit in January. I again  ?reiterated that he needs to establish with a BHIP team. He *does not* have a  ?primary neurocognitive disorder at this time and, while my subspecialty is  ?behavioral neurology and neuropsychiatry, I am primarily a neurologist with  ?specialty in cognitive disorders and the neuropsychiatry of the same. Hayden Rose needs  ?to re-establish treatment for the multiple psychiatric disorders with which he  ?has been diagnosed, as it is inappropriate for me to treat him for those as  ?primary diagnoses." ? ?ROS: Unable to obtain due to acute encephalopathy and minimal to no verbalization on exam.  ? ?Past Medical History:  ?Diagnosis Date  ? Anxiety   ? Arthritis   ? Back pain   ? chronic  ? Chronic kidney disease   ? stage III  renal disease   ? Degenerative disc disease   ? Depression   ? Dizziness   ? Dupuytren's contracture of right hand   ? Dyspnea   ? Elevated LFTs   ? Erectile dysfunction   ? Fall at home 08/12/2020  ? Hypothyroidism   ? Jaundice with stoppage of bile flow   ? after stent romoved  ? Lewy body dementia ([place])   ? Memory deficits 04/23/2013  ? Orthostatic hypotension   ? Osgood-Schlatter's disease   ? Restless leg syndrome 04/23/2013  ? Skin cancer   ? basal cell on back x2  ? Sleep apnea with use of continuous positive airway pressure (CPAP)   ? Tachycardia   ? heart monitor last week for 48 hours no report yet  ? ?Past Surgical History:  ?Procedure Laterality Date  ? APPENDECTOMY    ? BASAL CELL CARCINOMA EXCISION  2008,2009  ? CARPAL TUNNEL RELEASE  2003  ? CERVICAL FUSION  1985  ? CHOLECYSTECTOMY    ? GALLBLADDER SURGERY  2007-2008  ? LIVER BIOPSY  12/27/2011  ? Procedure: LIVER BIOPSY;  Surgeon: Inda Castle, MD;  Location: [facility];  Service: Endoscopy;  Laterality: N/A;  pt moved from 3/22 to 3/18 ( aw)  ? [place]  ? TONSILLECTOMY    ? ?Family History   ?Problem Relation Age of Onset  ? Coronary artery disease Mother   ? Heart attack Mother   ?     stents in neck and heart ?  ? Crohn's disease Mother   ? Cirrhosis Father   ? Cirrhosis Sister   ? Prostate cancer Maternal Uncle   ? Liver cancer Cousin   ? Colitis Daughter   ? Diabetes Maternal Uncle   ? Diabetes Brother   ? Kidney disease Maternal Aunt   ? Kidney disease Cousin   ? ?Social History:  ? reports that he quit smoking about 46 years ago. He has never used smokeless tobacco. He reports that he does not drink alcohol and does not use drugs. ? ?Medications ? ?Current Facility-Administered Medications:  ?  acetaminophen (TYLENOL) tablet 650 mg, 650 mg, Oral, Q6H PRN **OR** acetaminophen (TYLENOL) suppository 650 mg, 650 mg, Rectal, Q6H PRN, Jennette Kettle  M, DO ?  enoxaparin (LOVENOX) injection 40 mg, 40 mg, Subcutaneous, Q24H, Gardner, Jared M, DO, 40 mg at 12/25/21 2132 ?  feeding supplement (ENSURE ENLIVE / ENSURE PLUS) liquid 237 mL, 237 mL, Oral, BID BM, Jennette Kettle M, DO, 237 mL at 12/26/21 0844 ?  folic acid (FOLVITE) tablet 1 mg, 1 mg, Oral, Daily, Jennette Kettle M, DO, 1 mg at 12/26/21 7096 ?  lactated ringers infusion, , Intravenous, Continuous, Etta Quill, DO, Last Rate: 100 mL/hr at 12/26/21 0855, New Bag at 12/26/21 0855 ?  LORazepam (ATIVAN) tablet 1-4 mg, 1-4 mg, Oral, Q1H PRN, 1 mg at 12/26/21 0843 **OR** LORazepam (ATIVAN) injection 1-4 mg, 1-4 mg, Intravenous, Q1H PRN, Etta Quill, DO, 2 mg at 12/26/21 0451 ?  multivitamin with minerals tablet 1 tablet, 1 tablet, Oral, Daily, Etta Quill, DO, 1 tablet at 12/26/21 2836 ?  ondansetron (ZOFRAN) tablet 4 mg, 4 mg, Oral, Q6H PRN **OR** ondansetron (ZOFRAN) injection 4 mg, 4 mg, Intravenous, Q6H PRN, Etta Quill, DO ?  thiamine tablet 100 mg, 100 mg, Oral, Daily, 100 mg at 12/26/21 0843 **OR** thiamine (B-1) injection 100 mg, 100 mg, Intravenous, Daily, Jennette Kettle M, DO, 100 mg at 12/25/21 2131 ? ?Exam: ?Current vital  signs: ?BP 123/63 (BP Location: Right Arm)   Pulse (!) 46   Temp 99 ?F (37.2 ?C) (Oral)   Resp 14   Ht '5\' 8"'$  (1.727 m)   Wt 78.3 kg   SpO2 97%   BMI 26.25 kg/m?  ?Vital signs in last 24 hours: ?Temp:

## 2021-12-26 NOTE — Progress Notes (Signed)
PROGRESS NOTE        PATIENT DETAILS Name: Hayden Rose Age: 71 y.o. Sex: male Date of Birth: 26-Dec-1950 Admit Date: 12/25/2021 Admitting Physician Hillary Bow, DO VOZ:DGUYQI, Lenn Sink  Brief Summary: Patient is a 71 y.o.  male with history of vascular dementia/Lewy body dementia, prior psychiatric hospitalization for suicidal ideation-presented to the hospital with altered mental status-subsequently admitted to the hospitalist service for further evaluation and treatment.  Significant events: 3/17>> admit to [facility] for altered mental status.  Significant studies: 3/17>> CT head: No acute abnormalities. 3/17>> CT abdomen/pelvis: 4 mm calcification in head of pancreas-prior cholecystectomy but no acute findings. 3/17>> MRI brain: No acute intracranial abnormality  Significant microbiology data: 3/17>> COVID/influenza PCR: Negative  Procedures: None  Consults: None   Subjective: Sleeping comfortably but easily arouses-confused-speaks in 1-2 word sentences.  Moving all 4 extremities.    Objective: Vitals: Blood pressure 135/77, pulse (!) 53, temperature 97.6 F (36.4 C), temperature source Oral, resp. rate 18, height 5\' 8"  (1.727 m), weight 78.3 kg, SpO2 98 %.   Exam: Gen Exam:not in any distress-speaking in 1-2 word sentences HEENT:atraumatic, normocephalic Chest: B/L clear to auscultation anteriorly CVS:S1S2 regular Abdomen:soft non tender, non distended Extremities:no edema Neurology: Non focal Skin: no rash  Pertinent Labs/Radiology: CBC Latest Ref Rng & Units 12/25/2021 12/25/2021 08/19/2020  WBC 4.0 - 10.5 K/uL - 12.4(H) 8.7  Hemoglobin 13.0 - 17.0 g/dL 34.7 42.5 11.8(L)  Hematocrit 39.0 - 52.0 % 41.0 40.4 34.7(L)  Platelets 150 - 400 K/uL - 196 177    Lab Results  Component Value Date   NA 139 12/26/2021   K 3.6 12/26/2021   CL 106 12/26/2021   CO2 25 12/26/2021      Assessment/Plan: Acute metabolic toxic  encephalopathy: Unclear etiology-Long discussion with the spouse over the phone (she has moved out of the house since October 2022 due to aggressive/hypersexual behavior)-patient is on multiple medications but no family member is sure whether he actually takes medications or takes extra doses of his meds.  He apparently is no longer on narcotics, and is apparently on Xanax.  He did have mild AKI but he still is confused-therefore do not think AKI is the cause of his encephalopathy.  Although he does have history of dementia-he is still able to have a coherent conversation at times-and is still able to live by himself.  His current mental status is not at baseline.  Have discussed with pharmacy to see if we can get a updated list of his home medications-I will touch base with neurology-check EEG in the meantime.  Continue Ativan per CIWA protocol for now.  We will avoid Haldol given history of Parkinson's and try and use Seroquel if needed.  AKI on CKD stage IIIb: Creatinine better and back to baseline.  AKI likely hemodynamically mediated.  Rhabdomyolysis: Mild-continue gentle hydration-follow CK trend.  History of parkinsonism with possible Lewy body/vascular dementia: Per spouse-he apparently was diagnosed with Lewy body dementia in 2006-upon [facility]-he was told he could have vascular dementia as well.  But in any event-recently-patient has had significant issues with aggressive behavior/hypersexuality-that his spouse had had to leave the house in October 2022.  Since then-patient has been living alone-family has been trying to keep an eye on him-[facility]family is unclear  whether he actually takes medications as prescribed-some suspicion that he may be taking extra doses Xanax-and does smoke marijuana as well.  He no longer is on narcotics per spouse and follows with pain management where he gets "injections".  Once pharmacy  medication reconciliation is done-suspect we can restart Sinemet etc  Chronic pain syndrome: Apparently no longer on narcotics-and apparently gets injections.  Chronic benzodiazepine dependence: Apparently is on Xanax almost on a daily basis.  Hypothyroidism: TSH minimally elevated-once pharmacy medication reconciliation is done-we will resume levothyroxine.  HTN: BP relatively stable-not on any antihypertensives currently-resume when able.  HLD: Unclear if he is on statin-await medication reconciliation by pharmacy.  BMI: Estimated body mass index is 26.25 kg/m as calculated from the following:   Height as of this encounter: 5\' 8"  (1.727 m).   Weight as of this encounter: 78.3 kg.   Code status:   Code Status: Full Code   DVT Prophylaxis: enoxaparin (LOVENOX) injection 40 mg Start: 12/25/21 2100   Family Communication: Spouse [name]-[phone] over the phone on 3/18   Disposition Plan: Status is: Observation The patient will require care spanning > 2 midnights and should be moved to inpatient because: Altered mental status-unclear etiology-not yet back to baseline-not stable for discharge.   Planned Discharge Destination: To be determined   Diet: Diet Order             Diet Heart Room service appropriate? Yes; Fluid consistency: Thin  Diet effective now                     Antimicrobial agents: Anti-infectives (From admission, onward)    None        MEDICATIONS: Scheduled Meds:  enoxaparin (LOVENOX) injection  40 mg Subcutaneous Q24H   feeding supplement  237 mL Oral BID BM   folic acid  1 mg Oral Daily   multivitamin with minerals  1 tablet Oral Daily   thiamine  100 mg Oral Daily   Or   thiamine  100 mg Intravenous Daily   Continuous Infusions:  lactated ringers 100 mL/hr at 12/26/21 0855   PRN Meds:.acetaminophen **OR** acetaminophen, LORazepam **OR** LORazepam, ondansetron **OR** ondansetron (ZOFRAN) IV   I have personally reviewed  following labs and imaging studies  LABORATORY DATA: CBC: Recent Labs  Lab 12/25/21 1520 12/25/21 1541  WBC 12.4*  --   NEUTROABS 9.9*  --   HGB 14.2 13.9  HCT 40.4 41.0  MCV 84.0  --   PLT 196  --     Basic Metabolic Panel: Recent Labs  Lab 12/25/21 1520 12/25/21 1541 12/26/21 0108  NA 139 140 139  K 3.5 3.4* 3.6  CL 105 105 106  CO2 21*  --  25  GLUCOSE 129* 126* 93  BUN 47* 44* 45*  CREATININE 1.60* 1.50* 1.46*  CALCIUM 9.3  --  9.0    GFR: Estimated Creatinine Clearance: 46.2 mL/min (A) (by C-G formula based on SCr of 1.46 mg/dL (H)).  Liver Function Tests: Recent Labs  Lab 12/25/21 1520  AST 79*  ALT 33  ALKPHOS 64  BILITOT 2.1*  PROT 7.2  ALBUMIN 4.7   Recent Labs  Lab 12/25/21 1520  LIPASE 26   Recent Labs  Lab 12/25/21 2125  AMMONIA 21    Coagulation Profile: Recent Labs  Lab 12/25/21 1520  INR 1.1    Cardiac Enzymes: Recent Labs  Lab 12/25/21 1520 12/26/21 0108  CKTOTAL 2,300* 2,648*    BNP (last 3 results) No  results for input(s): PROBNP in the last 8760 hours.  Lipid Profile: No results for input(s): CHOL, HDL, LDLCALC, TRIG, CHOLHDL, LDLDIRECT in the last 72 hours.  Thyroid Function Tests: Recent Labs    12/25/21 2125  TSH 6.023*    Anemia Panel: No results for input(s): VITAMINB12, FOLATE, FERRITIN, TIBC, IRON, RETICCTPCT in the last 72 hours.  Urine analysis:    Component Value Date/Time   COLORURINE YELLOW 12/25/2021 1625   APPEARANCEUR HAZY (A) 12/25/2021 1625   LABSPEC 1.030 12/25/2021 1625   PHURINE 5.0 12/25/2021 1625   GLUCOSEU NEGATIVE 12/25/2021 1625   HGBUR MODERATE (A) 12/25/2021 1625   BILIRUBINUR NEGATIVE 12/25/2021 1625   KETONESUR 20 (A) 12/25/2021 1625   PROTEINUR 100 (A) 12/25/2021 1625   UROBILINOGEN 1.0 10/07/2012 1244   NITRITE NEGATIVE 12/25/2021 1625   LEUKOCYTESUR NEGATIVE 12/25/2021 1625    Sepsis Labs: Lactic Acid, Venous    Component Value Date/Time   LATICACIDVEN 0.71  09/30/2015 2115    MICROBIOLOGY: Recent Results (from the past 240 hour(s))  Resp Panel by RT-PCR (Flu A&B, Covid) Nasopharyngeal Swab     Status: None   Collection Time: 12/25/21  3:20 PM   Specimen: Nasopharyngeal Swab; Nasopharyngeal(NP) swabs in vial transport medium  Result Value Ref Range Status   SARS Coronavirus 2 by RT PCR NEGATIVE NEGATIVE Final    Comment: (NOTE) SARS-CoV-2 target nucleic acids are NOT DETECTED.  The SARS-CoV-2 RNA is generally detectable in upper respiratory specimens during the acute phase of infection. The lowest concentration of SARS-CoV-2 viral copies this assay can detect is 138 copies/mL. A negative result does not preclude SARS-Cov-2 infection and should not be used as the sole basis for treatment or other patient management decisions. A negative result may occur with  improper specimen collection/handling, submission of specimen other than nasopharyngeal swab, presence of viral mutation(s) within the areas targeted by this assay, and inadequate number of viral copies(<138 copies/mL). A negative result must be combined with clinical observations, patient history, and epidemiological information. The expected result is Negative.  Fact Sheet for Patients:  BloggerCourse.com  Fact Sheet for Healthcare Providers:  SeriousBroker.it  This test is no t yet approved or cleared by the [place] FDA and  has been authorized for detection and/or diagnosis of SARS-CoV-2 by FDA under an Emergency Use Authorization (EUA). This EUA will remain  in effect (meaning this test can be used) for the duration of the COVID-19 declaration under Section 564(b)(1) of the Act, 21 U.S.C.section 360bbb-3(b)(1), unless the authorization is terminated  or revoked sooner.       Influenza A by PCR NEGATIVE NEGATIVE Final   Influenza B by PCR NEGATIVE NEGATIVE Final    Comment: (NOTE) The Xpert Xpress  SARS-CoV-2/FLU/RSV plus assay is intended as an aid in the diagnosis of influenza from Nasopharyngeal swab specimens and should not be used as a sole basis for treatment. Nasal washings and aspirates are unacceptable for Xpert Xpress SARS-CoV-2/FLU/RSV testing.  Fact Sheet for Patients: BloggerCourse.com  Fact Sheet for Healthcare Providers: SeriousBroker.it  This test is not yet approved or cleared by the [place] FDA and has been authorized for detection and/or diagnosis of SARS-CoV-2 by FDA under an Emergency Use Authorization (EUA). This EUA will remain in effect (meaning this test can be used) for the duration of the COVID-19 declaration under Section 564(b)(1) of the Act, 21 U.S.C. section 360bbb-3(b)(1), unless the authorization is terminated or revoked.  Performed at [facility], [address]  [place], [place] [address]     RADIOLOGY STUDIES/RESULTS: CT ABDOMEN PELVIS WO CONTRAST  Result Date: 12/25/2021 CLINICAL DATA:  Abdominal pain and altered mental status. EXAM: CT ABDOMEN AND PELVIS WITHOUT CONTRAST TECHNIQUE: Multidetector CT imaging of the abdomen and pelvis was performed following the standard protocol without IV contrast. RADIATION DOSE REDUCTION: This exam was performed according to the departmental dose-optimization program which includes automated exposure control, adjustment of the mA and/or kV according to patient size and/or use of iterative reconstruction technique. COMPARISON:  October 31, 2018 FINDINGS: Lower chest: Mild atelectasis and/or infiltrate is seen within the left lower lobe. Hepatobiliary: No focal liver abnormality is seen. Surgical clips are seen along the anteromedial aspect of the right lobe of the liver. Status post cholecystectomy. No biliary dilatation. Pancreas: 4 mm calcification is seen within the head of the pancreas. This represents a new finding. No peripancreatic inflammatory  fat stranding or ductal dilatation is identified. Spleen: Normal in size without focal abnormality. Adrenals/Urinary Tract: Adrenal glands are unremarkable. Kidneys are normal, without renal calculi, focal lesion, or hydronephrosis. The urinary bladder is poorly distended with mild diffuse urinary bladder wall thickening. Stomach/Bowel: Stomach is within normal limits. The appendix is surgically absent. No evidence of bowel wall thickening, distention, or inflammatory changes. Vascular/Lymphatic: Aortic atherosclerosis. No enlarged abdominal or pelvic lymph nodes. Reproductive: Prostate is unremarkable. Other: There is a 1.7 cm x 1.2 cm fat containing left inguinal hernia. No abdominopelvic ascites. Musculoskeletal: Multilevel degenerative changes seen throughout the lumbar spine. IMPRESSION: 1. Mild left lower lobe atelectasis and/or infiltrate. 2. 4 mm calcification within the head of the pancreas which represents a new finding. This may represent sequelae associated with a prior pancreatitis. Correlation with pancreatic enzymes is recommended. 3. Small fat containing left inguinal hernia. 4. Evidence of prior cholecystectomy. 5. Aortic atherosclerosis. Aortic Atherosclerosis (ICD10-I70.0). Electronically Signed   By: Aram Candela M.D.   On: 12/25/2021 18:53   CT HEAD WO CONTRAST ( )  Result Date: 12/25/2021 CLINICAL DATA:  Pt BIB [facility] from home - lives alone, LKW 3/14 or 3/15. Son in law trying to get in touch w pt x days. Today son in law came to check on pt and noted "medical alarm blaring" and pt aphasic, unsteady gait and disoriented. Pt is baseline oriented with history of dementia but minimal symptoms. EMS noted R neglect EXAM: CT HEAD WITHOUT CONTRAST TECHNIQUE: Contiguous axial images were obtained from the base of the skull through the vertex without intravenous contrast. RADIATION DOSE REDUCTION: This exam was performed according to the departmental dose-optimization program which includes  automated exposure control, adjustment of the mA and/or kV according to patient size and/or use of iterative reconstruction technique. COMPARISON:  08/19/2020 FINDINGS: Brain: No evidence of acute infarction, hemorrhage, hydrocephalus, extra-axial collection or mass lesion/mass effect. Small foci of hypoattenuation along the right external capsule, stable consistent with old lacunar infarcts. Mild patchy white matter hypoattenuation, also stable and consistent with chronic microvascular ischemic change. Vascular: No hyperdense vessel or unexpected calcification. Skull: Normal. Negative for fracture or focal lesion. Sinuses/Orbits: Globes and orbits are unremarkable. Sinuses are clear. Other: None. IMPRESSION: 1. No acute intracranial abnormalities. No change from the prior head CT. Electronically Signed   By: Amie Portland M.D.   On: 12/25/2021 16:11   MR [name] CONTRAST  Result Date: 12/25/2021 CLINICAL DATA:  Delirium EXAM: MRI HEAD WITHOUT CONTRAST TECHNIQUE: Multiplanar, multiecho pulse sequences of the brain and surrounding structures were obtained without intravenous contrast. COMPARISON:  None. FINDINGS:  Brain: No acute infarct, mass effect or extra-axial collection. No acute or chronic hemorrhage. There is multifocal hyperintense T2-weighted signal within the white matter. Generalized volume loss without a clear lobar predilection. The midline structures are normal. 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 Vascular: Major flow voids are preserved. Skull and upper cervical spine: Normal calvarium and skull base. Visualized upper cervical spine and soft tissues are normal. Sinuses/Orbits:No paranasal sinus fluid levels or advanced mucosal thickening. No mastoid or middle ear effusion. Normal orbits. IMPRESSION: 1. No acute intracranial abnormality. 2. 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 Electronically Signed   By:  Deatra Robinson M.D.   On: 12/25/2021 19:53     LOS: 0 days   Jeoffrey Massed, MD  [facility]    To contact the attending provider between 7A-7P or the covering provider during after hours 7P-7A, please log into the web site [email or web address] and access using universal [place] password for that web site. If you do not have the password, please call the hospital operator.  12/26/2021, 10:38 AM

## 2021-12-26 NOTE — Procedures (Addendum)
Routine EEG Report ? ?Hayden Rose is a 71 y.o. male with a history of acute encephalopathy who is undergoing an EEG to evaluate for seizures. ? ?Report: This EEG was acquired with electrodes placed according to the International 10-20 electrode system (including Fp1, Fp2, F3, F4, C3, C4, P3, P4, O1, O2, T3, T4, T5, T6, A1, A2, Fz, Cz, Pz). The following electrodes were missing or displaced: none. ? ?The background was primary composed of frequencies in the theta range 7-8 Hz with overriding beta frequencies. This activity is reactive to stimulation. Drowsiness was manifested by background fragmentation; deeper stages of sleep were identified by K complexes and sleep spindles. There was no focal slowing. There were no interictal epileptiform discharges. There were no electrographic seizures identified. Photic stimulation and hyperventilation were not performed.  ? ?Impression and clinical correlation: This EEG was obtained while awake and asleep and is abnormal due to mild diffuse slowing indicative of global cerebral dysfunction. Epileptiform abnormalities were not seen during this recording. ? ?Numerous spells of brief full body or bilateral lower extremity jerks had no clear ictal correlate on EEG. ? ?Su Monks, MD ?[facility] ?[phone] ? ?If 7pm- 7am, please page neurology on call as listed in [place].  ?

## 2021-12-26 NOTE — Progress Notes (Signed)
EEG complete - results pending 

## 2021-12-27 DIAGNOSIS — N179 Acute kidney failure, unspecified: Secondary | ICD-10-CM | POA: Diagnosis not present

## 2021-12-27 DIAGNOSIS — F03918 Unspecified dementia, unspecified severity, with other behavioral disturbance: Secondary | ICD-10-CM | POA: Diagnosis not present

## 2021-12-27 DIAGNOSIS — G9340 Encephalopathy, unspecified: Secondary | ICD-10-CM | POA: Diagnosis not present

## 2021-12-27 DIAGNOSIS — F1320 Sedative, hypnotic or anxiolytic dependence, uncomplicated: Secondary | ICD-10-CM

## 2021-12-27 LAB — COMPREHENSIVE METABOLIC PANEL
ALT: 32 U/L (ref 0–44)
AST: 57 U/L — ABNORMAL HIGH (ref 15–41)
Albumin: 3.5 g/dL (ref 3.5–5.0)
Alkaline Phosphatase: 50 U/L (ref 38–126)
Anion gap: 9 (ref 5–15)
BUN: 35 mg/dL — ABNORMAL HIGH (ref 8–23)
CO2: 23 mmol/L (ref 22–32)
Calcium: 8.5 mg/dL — ABNORMAL LOW (ref 8.9–10.3)
Chloride: 102 mmol/L (ref 98–111)
Creatinine, Ser: 1.32 mg/dL — ABNORMAL HIGH (ref 0.61–1.24)
GFR, Estimated: 58 mL/min — ABNORMAL LOW (ref >60)
Glucose, Bld: 90 mg/dL (ref 70–99)
Potassium: 3.5 mmol/L (ref 3.5–5.1)
Sodium: 134 mmol/L — ABNORMAL LOW (ref 135–145)
Total Bilirubin: 1.3 mg/dL — ABNORMAL HIGH (ref 0.3–1.2)
Total Protein: 5.5 g/dL — ABNORMAL LOW (ref 6.5–8.1)

## 2021-12-27 LAB — VITAMIN B12: Vitamin B-12: 1078 pg/mL — ABNORMAL HIGH (ref 180–914)

## 2021-12-27 LAB — CK: Total CK: 928 U/L — ABNORMAL HIGH (ref 49–397)

## 2021-12-27 LAB — T4, FREE: Free T4: 0.75 ng/dL (ref 0.61–1.12)

## 2021-12-27 MED ORDER — LORAZEPAM 2 MG/ML IJ SOLN: 1.0000 mg | Freq: Four times a day (QID) | INTRAMUSCULAR | Status: DC | PRN

## 2021-12-27 MED ORDER — CARBIDOPA-LEVODOPA 25-100 MG PO TABS: 2.0000 | ORAL_TABLET | ORAL | Status: DC

## 2021-12-27 MED ORDER — ALPRAZOLAM 0.5 MG PO TABS
1.0000 mg | ORAL_TABLET | Freq: Two times a day (BID) | ORAL | Status: DC
  Administered 2021-12-27 – 2021-12-29 (×5): 1 mg via ORAL
  Filled 2021-12-27 (×5): qty 2

## 2021-12-27 MED ORDER — LEVOTHYROXINE SODIUM 112 MCG PO TABS
112.0000 ug | ORAL_TABLET | Freq: Every day | ORAL | Status: DC
  Administered 2021-12-27 – 2021-12-29 (×3): 112 ug via ORAL
  Filled 2021-12-27 (×3): qty 1

## 2021-12-27 MED ORDER — ATORVASTATIN CALCIUM 10 MG PO TABS: 10.0000 mg | ORAL_TABLET | Freq: Every day | ORAL | Status: DC

## 2021-12-27 MED ORDER — ESCITALOPRAM OXALATE 10 MG PO TABS
15.0000 mg | ORAL_TABLET | Freq: Every day | ORAL | Status: DC
  Administered 2021-12-27: 15 mg via ORAL
  Filled 2021-12-27: qty 2

## 2021-12-27 MED ORDER — PREGABALIN 100 MG PO CAPS
200.0000 mg | ORAL_CAPSULE | Freq: Two times a day (BID) | ORAL | Status: DC
  Administered 2021-12-27 – 2021-12-29 (×5): 200 mg via ORAL
  Filled 2021-12-27 (×5): qty 2

## 2021-12-27 MED ORDER — CARBIDOPA-LEVODOPA 25-100 MG PO TABS
2.0000 | ORAL_TABLET | ORAL | Status: DC
  Administered 2021-12-27 – 2021-12-28 (×4): 2 via ORAL
  Filled 2021-12-27 (×4): qty 2

## 2021-12-27 MED ORDER — QUETIAPINE FUMARATE 50 MG PO TABS
50.0000 mg | ORAL_TABLET | Freq: Every day | ORAL | Status: DC
  Administered 2021-12-27 – 2021-12-28 (×2): 50 mg via ORAL
  Filled 2021-12-27 (×2): qty 1

## 2021-12-27 MED ORDER — ESCITALOPRAM OXALATE 10 MG PO TABS
10.0000 mg | ORAL_TABLET | Freq: Every day | ORAL | Status: DC
  Administered 2021-12-28 – 2021-12-29 (×2): 10 mg via ORAL
  Filled 2021-12-27 (×2): qty 1

## 2021-12-27 MED ORDER — DULOXETINE HCL 30 MG PO CPEP
30.0000 mg | ORAL_CAPSULE | Freq: Two times a day (BID) | ORAL | Status: DC
  Administered 2021-12-27 – 2021-12-29 (×5): 30 mg via ORAL
  Filled 2021-12-27 (×5): qty 1

## 2021-12-27 MED ORDER — CARBIDOPA-LEVODOPA 25-100 MG PO TABS
1.0000 | ORAL_TABLET | ORAL | Status: DC
  Administered 2021-12-27 – 2021-12-29 (×4): 1 via ORAL
  Filled 2021-12-27 (×4): qty 1

## 2021-12-27 NOTE — Progress Notes (Signed)
?  Transition of Care (TOC) Screening Note ? ? ?Patient Details  ?Name: Hayden Rose ?Date of Birth: 12-13-1950 ? ? ?Transition of Care (TOC) CM/SW Contact:    ?Pollie Friar, RN ?Phone Number: ?12/27/2021, 11:06 AM ? ? ? ?Transition of Care Department [facility]) has reviewed patient. We will continue to monitor patient advancement through interdisciplinary progression rounds. If new patient transition needs arise, please place a TOC consult. ?  ?

## 2021-12-27 NOTE — Progress Notes (Signed)
?      ?                 PROGRESS NOTE ? ?      ?PATIENT DETAILS ?Name: Hayden Rose ?Age: 71 y.o. ?Sex: male ?Date of Birth: 1951-04-07 ?Admit Date: 12/25/2021 ?Admitting Physician Jonetta Osgood, MD ?EYC:[id number], [place] ? ?Brief Summary: ?Patient is a 71 y.o.  male with history of ? vascular dementia/Lewy body dementia, prior psychiatric hospitalization for suicidal ideation-presented to the hospital with altered mental status-subsequently admitted to the hospitalist service for further evaluation and treatment. ? ?Significant events: ?3/17>> admit to [facility] for altered mental status. ? ?Significant studies: ?3/17>> CT head: No acute abnormalities. ?3/17>> CT abdomen/pelvis: 4 mm calcification in head of pancreas-prior cholecystectomy but no acute findings. ?3/17>> MRI brain: No acute intracranial abnormality ?3/18>> EEG: No seizures ? ? ?Significant microbiology data: ?3/17>> COVID/influenza PCR: Negative ? ?Procedures: ?None ? ?Consults: ?None  ? ?Subjective: ?Awake-continues to answer in 1-2 word sentences.  Not in any distress.  Follows questions today compared to yesterday. ? ?Objective: ?Vitals: ?Blood pressure (!) 144/70, pulse (!) 50, temperature 98.5 ?F (36.9 ?C), temperature source Oral, resp. rate 18, height '5\' 8"'$  (1.727 m), weight 78.3 kg, SpO2 99 %.  ? ?Exam: ?Gen Exam:Alert awake-not in any distress ?HEENT:atraumatic, normocephalic ?Chest: B/L clear to auscultation anteriorly ?CVS:S1S2 regular ?Abdomen:soft non tender, non distended ?Extremities:no edema ?Neurology: Non focal ?Skin: no rash  ? ?Pertinent Labs/Radiology: ?CBC Latest Ref Rng & Units 12/25/2021 12/25/2021 08/19/2020  ?WBC 4.0 - 10.5 K/uL - 12.4(H) 8.7  ?Hemoglobin 13.0 - 17.0 g/dL 13.9 14.2 11.8(L)  ?Hematocrit 39.0 - 52.0 % 41.0 40.4 34.7(L)  ?Platelets 150 - 400 K/uL - 196 177  ?  ?Lab Results  ?Component Value Date  ? NA 134 (L) 12/27/2021  ? K 3.5 12/27/2021  ? CL 102 12/27/2021  ? CO2 23 12/27/2021  ?   ? ? ?Assessment/Plan: ?Acute metabolic toxic encephalopathy: Unclear etiology-probably related to polypharmacy/disturbed sleep/wake cycle/marijuana use superimposed on longstanding history of depression/anxiety.  Neuroimaging/EEG negative for any significant abnormalities.  Seems to be slowly improving-more awake-and more alert compared to the past few days.  Per neurology-he does not have a underlying neurodegenerative disorder (they extensively reviewed records in epic/Care Everywhere)-suspicion that underlying depression/anxiety etc. may be contributing to his presentation have consulted psych for assistance. ? ?AKI on CKD stage IIIb: AKI hemodynamically mediated-creatinine back to baseline.  ? ?Rhabdomyolysis: Mild-trending-continue to follow. ? ?Depression/anxiety disorder: Reviewed outpatient notes in care everywhere-apparently he has had worsening symptoms due to marital discord-we will  go ahead and resume-Lexapro/Xanax-see above regarding plans to consult psychiatry ? ?Hypothyroidism: TSH minimally elevated-continue levothyroxine-stable for outpatient follow-up with PCP for further optimization. ? ?History of peripheral neuropathy: Continue Lyrica, Cymbalta, Lexapro. ? ?History of RLS: Managed with Sinemet ? ?HLD: Holding statin until CK improves further. ? ?Marijuana use ? ?BMI: ?Estimated body mass index is 26.25 kg/m? as calculated from the following: ?  Height as of this encounter: '5\' 8"'$  (1.727 m). ?  Weight as of this encounter: 78.3 kg.  ? ?Code status: ?  Code Status: Full Code  ? ?DVT Prophylaxis: ?enoxaparin (LOVENOX) injection 40 mg Start: 12/25/21 2100 ?  ?Family Communication: Spouse [name]-[phone] over the phone on 3/18 ? ? ?Disposition Plan: ?Status is: Observation ?The patient will require care spanning > 2 midnights and should be moved to inpatient because: Altered mental status-unclear etiology-not yet back to baseline-not stable for discharge. ?  ?Planned Discharge Destination:  To  be determined ? ? ?Diet: ?Diet Order   ? ?       ?  Diet Heart Room service appropriate? Yes; Fluid consistency: Thin  Diet effective now       ?  ? ?  ?  ? ?  ?  ? ? ?Antimicrobial agents: ?Anti-infectives (From admission, onward)  ? ? None  ? ?  ? ? ? ?MEDICATIONS: ?Scheduled Meds: ? ALPRAZolam  1 mg Oral BID  ? atorvastatin  10 mg Oral QHS  ? carbidopa-levodopa  1 tablet Oral 2 times per day  ? carbidopa-levodopa  2 tablet Oral 2 times per day  ? DULoxetine  30 mg Oral BID  ? enoxaparin (LOVENOX) injection  40 mg Subcutaneous Q24H  ? escitalopram  15 mg Oral Daily  ? feeding supplement  237 mL Oral BID BM  ? folic acid  1 mg Oral Daily  ? levothyroxine  112 mcg Oral Q0600  ? multivitamin with minerals  1 tablet Oral Daily  ? pregabalin  200 mg Oral BID  ? thiamine  100 mg Oral Daily  ? Or  ? thiamine  100 mg Intravenous Daily  ? ?Continuous Infusions: ? lactated ringers 100 mL/hr at 12/26/21 2047  ? ?PRN Meds:.acetaminophen **OR** acetaminophen, LORazepam, ondansetron **OR** ondansetron (ZOFRAN) IV ? ? ?I have personally reviewed following labs and imaging studies ? ?LABORATORY DATA: ?CBC: ?Recent Labs  ?Lab 12/25/21 ?1520 12/25/21 ?1541  ?WBC 12.4*  --   ?NEUTROABS 9.9*  --   ?HGB 14.2 13.9  ?HCT 40.4 41.0  ?MCV 84.0  --   ?PLT 196  --   ? ? ? ?Basic Metabolic Panel: ?Recent Labs  ?Lab 12/25/21 ?1520 12/25/21 ?1541 12/26/21 ?0108 12/27/21 ?0500  ?NA 139 140 139 134*  ?K 3.5 3.4* 3.6 3.5  ?CL 105 105 106 102  ?CO2 21*  --  25 23  ?GLUCOSE 129* 126* 93 90  ?BUN 47* 44* 45* 35*  ?CREATININE 1.60* 1.50* 1.46* 1.32*  ?CALCIUM 9.3  --  9.0 8.5*  ? ? ? ?GFR: ?Estimated Creatinine Clearance: 51.1 mL/min (A) (by C-G formula based on SCr of 1.32 mg/dL (H)). ? ?Liver Function Tests: ?Recent Labs  ?Lab 12/25/21 ?1520 12/27/21 ?0500  ?AST 79* 57*  ?ALT 33 32  ?ALKPHOS 64 50  ?BILITOT 2.1* 1.3*  ?PROT 7.2 5.5*  ?ALBUMIN 4.7 3.5  ? ? ?Recent Labs  ?Lab 12/25/21 ?1520  ?LIPASE 26  ? ? ?Recent Labs  ?Lab 12/25/21 ?2125  ?AMMONIA  21  ? ? ? ?Coagulation Profile: ?Recent Labs  ?Lab 12/25/21 ?1520  ?INR 1.1  ? ? ? ?Cardiac Enzymes: ?Recent Labs  ?Lab 12/25/21 ?1520 12/26/21 ?0108 12/27/21 ?0500  ?CKTOTAL 2,300* 2,648* 928*  ? ? ? ?BNP (last 3 results) ?No results for input(s): PROBNP in the last 8760 hours. ? ?Lipid Profile: ?No results for input(s): CHOL, HDL, LDLCALC, TRIG, CHOLHDL, LDLDIRECT in the last 72 hours. ? ?Thyroid Function Tests: ?Recent Labs  ?  12/25/21 ?2125 12/27/21 ?0500  ?TSH 6.023*  --   ?FREET4  --  0.75  ? ? ? ?Anemia Panel: ?Recent Labs  ?  12/27/21 ?0500  ?ZDGLOVFI43 1,078*  ? ? ?Urine analysis: ?   ?Component Value Date/Time  ? [place] YELLOW 12/25/2021 1625  ? APPEARANCEUR HAZY (A) 12/25/2021 1625  ? LABSPEC 1.030 12/25/2021 1625  ? PHURINE 5.0 12/25/2021 1625  ? GLUCOSEU NEGATIVE 12/25/2021 1625  ? HGBUR MODERATE (A) 12/25/2021 1625  ? BILIRUBINUR NEGATIVE 12/25/2021  1625  ? KETONESUR 20 (A) 12/25/2021 1625  ? PROTEINUR 100 (A) 12/25/2021 1625  ? UROBILINOGEN 1.0 10/07/2012 1244  ? NITRITE NEGATIVE 12/25/2021 1625  ? LEUKOCYTESUR NEGATIVE 12/25/2021 1625  ? ? ?Sepsis Labs: ?Lactic Acid, Venous ?   ?Component Value Date/Time  ? LATICACIDVEN 0.71 09/30/2015 2115  ? ? ?MICROBIOLOGY: ?Recent Results (from the past 240 hour(s))  ?Resp Panel by RT-PCR (Flu A&B, Covid) Nasopharyngeal Swab     Status: None  ? Collection Time: 12/25/21  3:20 PM  ? Specimen: Nasopharyngeal Swab; Nasopharyngeal(NP) swabs in vial transport medium  ?Result Value Ref Range Status  ? SARS Coronavirus 2 by RT PCR NEGATIVE NEGATIVE Final  ?  Comment: (NOTE) ?SARS-CoV-2 target nucleic acids are NOT DETECTED. ? ?The SARS-CoV-2 RNA is generally detectable in upper respiratory ?specimens during the acute phase of infection. The lowest ?concentration of SARS-CoV-2 viral copies this assay can detect is ?138 copies/mL. A negative result does not preclude SARS-Cov-2 ?infection and should not be used as the sole basis for treatment or ?other patient  management decisions. A negative result may occur with  ?improper specimen collection/handling, submission of specimen other ?than nasopharyngeal swab, presence of viral mutation(s) within the ?areas targeted by this assay, and in
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
## 2021-12-28 DIAGNOSIS — F1920 Other psychoactive substance dependence, uncomplicated: Secondary | ICD-10-CM | POA: Diagnosis not present

## 2021-12-28 DIAGNOSIS — F1990 Other psychoactive substance use, unspecified, uncomplicated: Secondary | ICD-10-CM | POA: Diagnosis not present

## 2021-12-28 DIAGNOSIS — M6282 Rhabdomyolysis: Secondary | ICD-10-CM | POA: Diagnosis not present

## 2021-12-28 DIAGNOSIS — Z79899 Other long term (current) drug therapy: Secondary | ICD-10-CM

## 2021-12-28 DIAGNOSIS — G9340 Encephalopathy, unspecified: Secondary | ICD-10-CM | POA: Diagnosis not present

## 2021-12-28 DIAGNOSIS — F1120 Opioid dependence, uncomplicated: Secondary | ICD-10-CM | POA: Diagnosis not present

## 2021-12-28 DIAGNOSIS — N179 Acute kidney failure, unspecified: Secondary | ICD-10-CM | POA: Diagnosis not present

## 2021-12-28 LAB — BASIC METABOLIC PANEL
Anion gap: 7 (ref 5–15)
BUN: 29 mg/dL — ABNORMAL HIGH (ref 8–23)
CO2: 24 mmol/L (ref 22–32)
Calcium: 8.6 mg/dL — ABNORMAL LOW (ref 8.9–10.3)
Chloride: 103 mmol/L (ref 98–111)
Creatinine, Ser: 1.25 mg/dL — ABNORMAL HIGH (ref 0.61–1.24)
GFR, Estimated: >60 mL/min (ref >60)
Glucose, Bld: 89 mg/dL (ref 70–99)
Potassium: 3.6 mmol/L (ref 3.5–5.1)
Sodium: 134 mmol/L — ABNORMAL LOW (ref 135–145)

## 2021-12-28 LAB — CK: Total CK: 354 U/L (ref 49–397)

## 2021-12-28 NOTE — Progress Notes (Addendum)
?      ?                 PROGRESS NOTE ? ?      ?PATIENT DETAILS ?Name: Hayden Rose ?Age: 71 y.o. ?Sex: male ?Date of Birth: 07-13-1951 ?Admit Date: 12/25/2021 ?Admitting Physician Jonetta Osgood, MD ?ZOX:WRUEAV, [place] ? ?Brief Summary: ?Patient is a 71 y.o.  male with history of ? vascular dementia/Lewy body dementia, prior psychiatric hospitalization for suicidal ideation-presented to the hospital with altered mental status-subsequently admitted to the hospitalist service for further evaluation and treatment. ? ?Significant events: ?3/17>> admit to [facility] for altered mental status. ? ?Significant studies: ?3/17>> CT head: No acute abnormalities. ?3/17>> CT abdomen/pelvis: 4 mm calcification in head of pancreas-prior cholecystectomy but no acute findings. ?3/17>> MRI brain: No acute intracranial abnormality ?3/18>> EEG: No seizures ? ? ?Significant microbiology data: ?3/17>> COVID/influenza PCR: Negative ? ?Procedures: ?None ? ?Consults: ?None  ? ?Subjective: ?Seems to be significantly better today-awake-alert-conversant.  Knows he is in the hospital-remembers his son-in-law coming into the house/alarm going off (on the day of admission).  He is actually able to talk in full sentences today. ? ?Objective: ?Vitals: ?Blood pressure 136/74, pulse (!) 55, temperature 97.8 ?F (36.6 ?C), temperature source Oral, resp. rate 20, height '5\' 8"'$  (1.727 m), weight 81.5 kg, SpO2 99 %.  ? ?Exam: ?Gen Exam:Alert awake-not in any distress ?HEENT:atraumatic, normocephalic ?Chest: B/L clear to auscultation anteriorly ?CVS:S1S2 regular ?Abdomen:soft non tender, non distended ?Extremities:no edema ?Neurology: Non focal ?Skin: no rash   ? ?Pertinent Labs/Radiology: ?CBC Latest Ref Rng & Units 12/25/2021 12/25/2021 08/19/2020  ?WBC 4.0 - 10.5 K/uL - 12.4(H) 8.7  ?Hemoglobin 13.0 - 17.0 g/dL 13.9 14.2 11.8(L)  ?Hematocrit 39.0 - 52.0 % 41.0 40.4 34.7(L)  ?Platelets 150 - 400 K/uL - 196 177  ?  ?Lab Results  ?Component Value Date   ? NA 134 (L) 12/28/2021  ? K 3.6 12/28/2021  ? CL 103 12/28/2021  ? CO2 24 12/28/2021  ?  ? ? ?Assessment/Plan: ?Acute metabolic toxic encephalopathy: Unclear etiology-but remarkably better today.  Suspect etiology related to polypharmacy/disturbed sleep/wake cycle and marijuana use.  MRI without any acute abnormalities-EEG without seizures.  No further recommendations from neurology-Per neurology-the extensively reviewed outpatient records-and patient does not have a underlying diagnosis of neurodegenerative disorder.  Psychiatry following-see below. ? ?AKI on CKD stage IIIb: AKI hemodynamically mediated-creatinine back to baseline.  ? ?Rhabdomyolysis: CK levels of normalized. ? ?Depression/anxiety disorder: Reviewed outpatient notes in care everywhere-apparently he has had worsening symptoms due to marital discord-continue Lexapro/Cymbalta/Xanax-started on Seroquel per psychiatry recommendation yesterday.  Will await further recommendations from psychiatry.   ? ?Addendum: ?D/w with Psychiatry team-unclear what cause his presentation on admission-plan is to continue observation for another day-before consideration of discharge. ? ?Hypothyroidism: TSH minimally elevated-continue levothyroxine-stable for outpatient follow-up with PCP for further optimization. ? ?History of peripheral neuropathy: Continue Lyrica, Cymbalta, Lexapro. ? ?History of RLS: Managed with Sinemet ? ?HLD: Holding statin until CK improves further. ? ?Marijuana use ? ?BMI: ?Estimated body mass index is 27.32 kg/m? as calculated from the following: ?  Height as of this encounter: '5\' 8"'$  (1.727 m). ?  Weight as of this encounter: 81.5 kg.  ? ?Code status: ?  Code Status: Full Code  ? ?DVT Prophylaxis: ?enoxaparin (LOVENOX) injection 40 mg Start: 12/25/21 2100 ?  ?Family Communication:  ?Daughter-Brooke Event organiser on 3/20 ?Spouse [name]-[phone] over the phone on 3/18 ? ? ?Disposition Plan: ?Status is:  Observation ?The patient will require care spanning > 2 midnights and should be moved to inpatient because: Resolving encephalopathy-improving rapidly-we will await further recommendations from psychiatry/await arrival of family to see if he is back to baseline. ?  ?Planned Discharge Destination: Probably home with home health. ? ? ?Diet: ?Diet Order   ? ?       ?  Diet Heart Room service appropriate? Yes; Fluid consistency: Thin  Diet effective now       ?  ? ?  ?  ? ?  ?  ? ? ?Antimicrobial agents: ?Anti-infectives (From admission, onward)  ? ? None  ? ?  ? ? ? ?MEDICATIONS: ?Scheduled Meds: ? ALPRAZolam  1 mg Oral BID  ? carbidopa-levodopa  1 tablet Oral 2 times per day  ? carbidopa-levodopa  2 tablet Oral 2 times per day  ? DULoxetine  30 mg Oral BID  ? enoxaparin (LOVENOX) injection  40 mg Subcutaneous Q24H  ? escitalopram  10 mg Oral Daily  ? feeding supplement  237 mL Oral BID BM  ? folic acid  1 mg Oral Daily  ? levothyroxine  112 mcg Oral Q0600  ? multivitamin with minerals  1 tablet Oral Daily  ? pregabalin  200 mg Oral BID  ? QUEtiapine  50 mg Oral QHS  ? thiamine  100 mg Oral Daily  ? Or  ? thiamine  100 mg Intravenous Daily  ? ?Continuous Infusions: ? ? ?PRN Meds:.acetaminophen **OR** acetaminophen, LORazepam, ondansetron **OR** ondansetron (ZOFRAN) IV ? ? ?I have personally reviewed following labs and imaging studies ? ?LABORATORY DATA: ?CBC: ?Recent Labs  ?Lab 12/25/21 ?1520 12/25/21 ?1541  ?WBC 12.4*  --   ?NEUTROABS 9.9*  --   ?HGB 14.2 13.9  ?HCT 40.4 41.0  ?MCV 84.0  --   ?PLT 196  --   ? ? ? ?Basic Metabolic Panel: ?Recent Labs  ?Lab 12/25/21 ?1520 12/25/21 ?1541 12/26/21 ?0108 12/27/21 ?0500 12/28/21 ?1660  ?NA 139 140 139 134* 134*  ?K 3.5 3.4* 3.6 3.5 3.6  ?CL 105 105 106 102 103  ?CO2 21*  --  '25 23 24  '$ ?GLUCOSE 129* 126* 93 90 89  ?BUN 47* 44* 45* 35* 29*  ?CREATININE 1.60* 1.50* 1.46* 1.32* 1.25*  ?CALCIUM 9.3  --  9.0 8.5* 8.6*  ? ? ? ?GFR: ?Estimated Creatinine Clearance: 54 mL/min (A)  (by C-G formula based on SCr of 1.25 mg/dL (H)). ? ?Liver Function Tests: ?Recent Labs  ?Lab 12/25/21 ?1520 12/27/21 ?0500  ?AST 79* 57*  ?ALT 33 32  ?ALKPHOS 64 50  ?BILITOT 2.1* 1.3*  ?PROT 7.2 5.5*  ?ALBUMIN 4.7 3.5  ? ? ?Recent Labs  ?Lab 12/25/21 ?1520  ?LIPASE 26  ? ? ?Recent Labs  ?Lab 12/25/21 ?2125  ?AMMONIA 21  ? ? ? ?Coagulation Profile: ?Recent Labs  ?Lab 12/25/21 ?1520  ?INR 1.1  ? ? ? ?Cardiac Enzymes: ?Recent Labs  ?Lab 12/25/21 ?1520 12/26/21 ?0108 12/27/21 ?0500 12/28/21 ?6301  ?CKTOTAL 2,300* 2,648* 928* 354  ? ? ? ?BNP (last 3 results) ?No results for input(s): PROBNP in the last 8760 hours. ? ?Lipid Profile: ?No results for input(s): CHOL, HDL, LDLCALC, TRIG, CHOLHDL, LDLDIRECT in the last 72 hours. ? ?Thyroid Function Tests: ?Recent Labs  ?  12/25/21 ?2125 12/27/21 ?0500  ?TSH 6.023*  --   ?FREET4  --  0.75  ? ? ? ?Anemia Panel: ?Recent Labs  ?  12/27/21 ?0500  ?[id number] 1,078*  ? ? ? ?Urine analysis: ?   ?  Component Value Date/Time  ? [place] YELLOW 12/25/2021 1625  ? APPEARANCEUR HAZY (A) 12/25/2021 1625  ? LABSPEC 1.030 12/25/2021 1625  ? PHURINE 5.0 12/25/2021 1625  ? GLUCOSEU NEGATIVE 12/25/2021 1625  ? HGBUR MODERATE (A) 12/25/2021 1625  ? [place] NEGATIVE 12/25/2021 1625  ? KETONESUR 20 (A) 12/25/2021 1625  ? PROTEINUR 100 (A) 12/25/2021 1625  ? UROBILINOGEN 1.0 10/07/2012 1244  ? NITRITE NEGATIVE 12/25/2021 1625  ? LEUKOCYTESUR NEGATIVE 12/25/2021 1625  ? ? ?Sepsis Labs: ?Lactic Acid, Venous ?   ?Component Value Date/Time  ? LATICACIDVEN 0.71 09/30/2015 2115  ? ? ?MICROBIOLOGY: ?Recent Results (from the past 240 hour(s))  ?Resp Panel by RT-PCR (Flu A&B, Covid) Nasopharyngeal Swab     Status: None  ? Collection Time: 12/25/21  3:20 PM  ? Specimen: Nasopharyngeal Swab; Nasopharyngeal(NP) swabs in vial transport medium  ?Result Value Ref Range Status  ? SARS Coronavirus 2 by RT PCR NEGATIVE NEGATIVE Final  ?  Comment: (NOTE) ?SARS-CoV-2 target nucleic acids are NOT DETECTED. ? ?The  SARS-CoV-2 RNA is generally detectable in upper respiratory ?specimens during the acute phase of infection. The lowest ?concentration of SARS-CoV-2 viral copies this assay can detect is ?138 copies/mL. A negative resul

## 2021-12-28 NOTE — Progress Notes (Signed)
Nutrition Brief Note ? ?Patient identified on the Malnutrition Screening Tool (MST) Report. ? ?Admitting Dx: Acute encephalopathy [G93.40] ?AKI (acute kidney injury) ([place]) [N17.9] ?Non-traumatic rhabdomyolysis [M62.82] ?Altered mental status, unspecified altered mental status type [R41.82] ?Altered mental state [R41.82] ?PMH:  ?Past Medical History:  ?Diagnosis Date  ? Anxiety   ? Arthritis   ? Back pain   ? chronic  ? Chronic kidney disease   ? stage III  renal disease   ? Degenerative disc disease   ? Depression   ? Dizziness   ? Dupuytren's contracture of right hand   ? Dyspnea   ? Elevated LFTs   ? Erectile dysfunction   ? Fall at home 08/12/2020  ? Hypothyroidism   ? Jaundice with stoppage of bile flow   ? after stent romoved  ? Lewy body dementia ([place])   ? Memory deficits 04/23/2013  ? Orthostatic hypotension   ? Osgood-Schlatter's disease   ? Restless leg syndrome 04/23/2013  ? Skin cancer   ? basal cell on back x2  ? Sleep apnea with use of continuous positive airway pressure (CPAP)   ? Tachycardia   ? heart monitor last week for 48 hours no report yet  ? ? ?Medications:  ? ALPRAZolam  1 mg Oral BID  ? carbidopa-levodopa  1 tablet Oral 2 times per day  ? carbidopa-levodopa  2 tablet Oral 2 times per day  ? DULoxetine  30 mg Oral BID  ? enoxaparin (LOVENOX) injection  40 mg Subcutaneous Q24H  ? escitalopram  10 mg Oral Daily  ? feeding supplement  237 mL Oral BID BM  ? folic acid  1 mg Oral Daily  ? levothyroxine  112 mcg Oral Q0600  ? multivitamin with minerals  1 tablet Oral Daily  ? pregabalin  200 mg Oral BID  ? QUEtiapine  50 mg Oral QHS  ? thiamine  100 mg Oral Daily  ? Or  ? thiamine  100 mg Intravenous Daily  ? ?Labs: ?Recent Labs  ?Lab 12/26/21 ?0108 12/27/21 ?0500 12/28/21 ?8242  ?NA 139 134* 134*  ?K 3.6 3.5 3.6  ?CL 106 102 103  ?CO2 '25 23 24  '$ ?BUN 45* 35* 29*  ?CREATININE 1.46* 1.32* 1.25*  ?CALCIUM 9.0 8.5* 8.6*  ?GLUCOSE 93 90 89  ? ? ?Wt Readings from Last 15 Encounters:  ?12/28/21 81.5 kg   ?08/19/20 72.6 kg  ?08/11/20 74.8 kg  ?10/01/15 110 kg  ?04/23/14 93.9 kg  ?04/02/14 97.1 kg  ?04/23/13 93.4 kg  ?03/30/13 94.3 kg  ?12/01/12 92.5 kg  ?10/07/12 89.8 kg  ?01/12/12 87.2 kg  ?12/27/11 84.4 kg  ?12/08/11 82.8 kg  ? ? ?Body mass index is 27.32 kg/m?Marland Kitchen Patient meets criteria for overweight based on current BMI.  ? ?Current diet order is heart healthy, patient is consuming approximately 75% of meals at this time and reports no issues with appetite. Pt already with orders for Ensure Enlive BID as part of MST protocol; no additional nutrition interventions warranted at this time. If nutrition issues arise, please consult RD.  ? ? ?Theone Stanley., MS, RD, LDN (she/her/hers) ?RD pager number and weekend/on-call pager number located in [place]. ? ? ? ? ?

## 2021-12-28 NOTE — Consult Note (Addendum)
[facility] Face-to-Face Psychiatry Consult  ? ?Reason for Consult:  "Confusion....very complicated patient...Marland Kitchen please see extensive neurology consultation....patient doesn't have a Neuro degenerative disorder per extensive w/u as outpatient... he mostly has issues w depression etc" ?Referring Physician:  Dr. Sloan Rose ?Patient Identification: Hayden Rose ?MRN:  [id number] ?Principal Diagnosis: Acute encephalopathy ?Diagnosis:  Principal Problem: ?  Acute encephalopathy ?Active Problems: ?  Benzodiazepine dependence ([place]) ?  Polypharmacy ?  Opioid use disorder, moderate, dependence ([place]) ?  Dementia with behavioral disturbance ?  AKI (acute kidney injury) ([place]) ?  Altered mental state ? ? ?Total Time spent with patient: 30 minutes ? ?Subjective:   ?Hayden Rose is a 71 y.o. male with h/o history significant of reported vascular / lewy body dementia, CKD 3, prior opiate dependence, current benzo dependence, current THC use, hypothyroidism who presented to the ED with AMS and was admitted for further work up on 12/25/21. Neurology was consulted and work up was largely negative- see note from 3/18 by Hayden Rose. Patient sees a Hayden Rose at the [facility] who has documented (based on information by chart review) that patient is not believed to have a primary neurocognitive disorder despite often reporting cognitive issues although has had issues with polypharmacy. Psychiatry was consulted for assistance with AMS. ? ?Patient seen and chart reviewed. Presented with marked improvement today compared to yesterday. He was able to discuss events over past ~24 hours with surprising clarity; does not remember earlier into hospitalization (has no memory of seeing Hayden Rose, thinks he has been in the hospital for 1-2 days). He did well on brief bedside neurocognitive eval; was oriented to self, situation, month, year. Patient seen in late afternoon. Patient reports his mood is "bad" because his wife left him. He said the reason was that  wife was scared of him. He reports forgetting the past couple of days in the hospital. Patient reports he was never diagnosed with catatonia.Able to do DOWB quickly with no errors (struggled with MOYB). Able to name last 2 presidents. 4/4 on [facility] ICU. No SI, HI, AH/VH endorsed; states last had SI because of pain.  ? ? ?  ?Social History: ?Patient served in the [place] at [address] and in [place]. He has a wife, 3 daughters, 1 son, and grand-kids. Wife left him recently. Son is a Theme park manager. He attends church. ? ?Hayden Rose (Spouse)  - 12/27/21 ?[phone] ([place]) ?Called at 12:20 pm ?-for safety issues has not been living with  patient recently.  ?-initially it started him off as lewy body dementia w cognitive impaitment and saw neuropsych who said he had vascular dementia d/t prior TIAs. [facility])- Hayden Rose at the [facility] who is a neuropsychiatrist. Hayden Rose told me he had dementia and said it would progress. Doctors at [facility] believe it and [name] doesn't believe it. Recent months has had a hard tome focusing. Daughter and SIL went to the home yeterday to get some clothes. He has been hoarding papers everywhere, no water in the toilet, bathtub was messed up,  whole bottle of xanax that has not been opened since February.  ?He has never been to the point where he cant put sentences together like how he is now.  has threatened to kill me and burn the place down within the last few months. Dont know where he gets his marijuana from, possibly something in it. Over the past 8-9 months he "is up or down and gets his days and nights mixed  up".  ?He normally knows the month and the year ; states that  he called her 2 days before valentines day and said he was going to see her.  ?She called Hayden Rose in January to say he was "going downhill".  ? ? ? ? ?Past Psychiatric History: per chart review MDD; per collateral obtained from neurology ?manic sx ? ?Risk to Self: no ?Risk to  Others:no   ?Prior Inpatient Therapy:  yes per collateral obtained by neurology ?Prior Outpatient Therapy:  yes ? ?Past Medical History:  ?Past Medical History:  ?Diagnosis Date  ? Anxiety   ? Arthritis   ? Back pain   ? chronic  ? Chronic kidney disease   ? stage III  renal disease   ? Degenerative disc disease   ? Depression   ? Dizziness   ? Dupuytren's contracture of right hand   ? Dyspnea   ? Elevated LFTs   ? Erectile dysfunction   ? Fall at home 08/12/2020  ? Hypothyroidism   ? Jaundice with stoppage of bile flow   ? after stent romoved  ? Lewy body dementia ([place])   ? Memory deficits 04/23/2013  ? Orthostatic hypotension   ? Osgood-Schlatter's disease   ? Restless leg syndrome 04/23/2013  ? Skin cancer   ? basal cell on back x2  ? Sleep apnea with use of continuous positive airway pressure (CPAP)   ? Tachycardia   ? heart monitor last week for 48 hours no report yet  ?  ?Past Surgical History:  ?Procedure Laterality Date  ? APPENDECTOMY    ? BASAL CELL CARCINOMA EXCISION  2008,2009  ? CARPAL TUNNEL RELEASE  2003  ? CERVICAL FUSION  1985  ? CHOLECYSTECTOMY    ? GALLBLADDER SURGERY  2007-2008  ? LIVER BIOPSY  12/27/2011  ? Procedure: LIVER BIOPSY;  Surgeon: Inda Castle, MD;  Location: [facility];  Service: Endoscopy;  Laterality: N/A;  pt moved from 3/22 to 3/18 ( aw)  ? [place]  ? TONSILLECTOMY    ? ?Family History:  ?Family History  ?Problem Relation Age of Onset  ? Coronary artery disease Mother   ? Heart attack Mother   ?     stents in neck and heart ?  ? Crohn's disease Mother   ? Cirrhosis Father   ? Cirrhosis Sister   ? Prostate cancer Maternal Uncle   ? Liver cancer Cousin   ? Colitis Daughter   ? Diabetes Maternal Uncle   ? Diabetes Brother   ? Kidney disease Maternal Aunt   ? Kidney disease Cousin   ? ?Family Psychiatric  History: unknown ?Social History:  ?Social History  ? ?Substance and Sexual Activity  ?Alcohol Use No  ?   ?Social History  ? ?Substance and Sexual Activity  ?Drug Use  No  ? Comment: history of opiate abuse  ?  ?Social History  ? ?Socioeconomic History  ? Marital status: Married  ?  Spouse name: Barnetta Chapel  ? Number of children: 2  ? Years of education: 9th  ? Highest education level: Not on file  ?Occupational History  ? Occupation: disabled  ?Tobacco Use  ? Smoking status: Former  ?  Types: Cigarettes  ?  Quit date: 12/27/1975  ?  Years since quitting: 46.0  ? Smokeless tobacco: Never  ?Vaping Use  ? Vaping Use: Never used  ?Substance and Sexual Activity  ? Alcohol use: No  ? Drug use: No  ?  Comment:  history of opiate abuse  ? Sexual activity: Yes  ?  Birth control/protection: Condom  ?Other Topics Concern  ? Not on file  ?Social History Narrative  ? Patient is married Barnetta Chapel) and lives at home with his wife and his mother.  ? Patient is disabled.  ? Patient has a 9th grade education.  ? Patient is right-handed.  ? Patient does not drink any caffeine.  ? Patient has two adult childre  ? Does not regularly exercise.   ? ?Social Determinants of Health  ? ?Financial Resource Strain: Not on file  ?Food Insecurity: Not on file  ?Transportation Needs: Not on file  ?Physical Activity: Not on file  ?Stress: Not on file  ?Social Connections: Not on file  ? ?Additional Social History: ?  ? ?Allergies:   ?Allergies  ?Allergen Reactions  ? Vesicare [Solifenacin Succinate] Anaphylaxis  ? Sulfa Antibiotics Swelling  ? Aricept [Donepezil Hydrochloride] Nausea And Vomiting and Other (See Comments)  ? Elemental Sulfur Swelling  ? Gabapentin   ? Lamictal [Lamotrigine]   ?  Petechia  ? Namenda [Memantine Hcl] Other (See Comments)  ?  unknown  ? Exelon [Rivastigmine] Rash  ?  patch  ? Fentanyl Rash  ?  patch  ? Penicillins Rash  ? ? ?Labs:  ?Results for orders placed or performed during the hospital encounter of 12/25/21 (from the past 48 hour(s))  ?Vitamin B12     Status: Abnormal  ? Collection Time: 12/27/21  5:00 AM  ?Result Value Ref Range  ? Vitamin B-12 1,078 (H) 180 - 914 pg/mL  ?   Comment: (NOTE) ?This assay is not validated for testing neonatal or ?myeloproliferative syndrome specimens for Vitamin B12 levels. ?Performed at [facility], [place] [address] [place], [place] ?[address]

## 2021-12-28 NOTE — Evaluation (Signed)
Physical Therapy Evaluation ?Patient Details ?Name: Hayden Rose ?MRN: [id number] ?DOB: [birth year] ?Today's Date: 12/28/2021 ? ?History of Present Illness ? Pt adm 3/17 for AMS. Pt with acute metabolic toxic encephalopathy of unclear etiology. Pt also with rhabdomyolysis.  MRI and CT negative. PMH - ?vascular dementia/Lewy body dementia, psychiatric hospitalization, depression/anxiety, peripheral neuropathy  ?Clinical Impression ? Pt doing well with mobility and no further PT needed.  Ready for dc from PT standpoint. ?   ?   ? ?Recommendations for follow up therapy are one component of a multi-disciplinary discharge planning process, led by the attending physician.  Recommendations may be updated based on patient status, additional functional criteria and insurance authorization. ? ?Follow Up Recommendations No PT follow up ? ?  ?Assistance Recommended at Discharge None  ?Patient can return home with the following ?   ? ?  ?Equipment Recommendations None recommended by PT  ?Recommendations for Other Services ?    ?  ?Functional Status Assessment    ? ?  ?Precautions / Restrictions Precautions ?Precautions: None  ? ?  ? ?Mobility ? Bed Mobility ?  ?  ?  ?  ?  ?  ?  ?General bed mobility comments: Pt up in chair ?  ? ?Transfers ?Overall transfer level: Modified independent ?Equipment used: None ?  ?  ?  ?  ?  ?  ?  ?  ?  ? ?Ambulation/Gait ?Ambulation/Gait assistance: Modified independent (Device/Increase time) ?Gait Distance (Feet): 350 Feet ?Assistive device: None ?Gait Pattern/deviations: WFL(Within Functional Limits) ?Gait velocity: adequate ?Gait velocity interpretation: >2.62 ft/sec, indicative of community ambulatory ?  ?General Gait Details: Steady gait ? ?Stairs ?  ?  ?  ?  ?  ? ?Wheelchair Mobility ?  ? ?Modified Rankin (Stroke Patients Only) ?  ? ?  ? ?Balance Overall balance assessment: Mild deficits observed, not formally tested ?  ?  ?  ?  ?  ?  ?  ?  ?  ?  ?  ?  ?  ?  ?  ?  ?  ?  ?   ? ? ? ?Pertinent  Vitals/Pain Pain Assessment ?Pain Assessment: Faces ?Faces Pain Scale: Hurts a little bit ?Pain Location: knees ?Pain Descriptors / Indicators: Grimacing, Guarding ?Pain Intervention(s): Limited activity within patient's tolerance  ? ? ?Home Living Family/patient expects to be discharged to:: Private residence ?  ?  ?Type of Home: House ?Home Access: Stairs to enter ?Entrance Stairs-Rails: Right ?Entrance Stairs-Number of Steps: 3 ?  ?Home Layout: One level ?  ?   ?  ?Prior Function Prior Level of Function : Independent/Modified Independent ?  ?  ?  ?  ?  ?  ?Mobility Comments: amb without assistive device ?  ?  ? ? ?Hand Dominance  ? Dominant Hand: Right ? ?  ?Extremity/Trunk Assessment  ? Upper Extremity Assessment ?Upper Extremity Assessment: Overall WFL for tasks assessed ?  ? ?Lower Extremity Assessment ?Lower Extremity Assessment: Overall WFL for tasks assessed ?  ? ?   ?Communication  ? Communication: No difficulties  ?Cognition Arousal/Alertness: Awake/alert ?Behavior During Therapy: [facility] for tasks assessed/performed ?Overall Cognitive Status: Within Functional Limits for tasks assessed ?  ?  ?  ?  ?  ?  ?  ?  ?  ?  ?  ?  ?  ?  ?  ?  ?  ?  ?  ? ?  ?General Comments   ? ?  ?Exercises    ? ?Assessment/Plan  ?  ?  PT Assessment Patient does not need any further PT services  ?PT Problem List   ? ?   ?  ?PT Treatment Interventions     ? ?PT Goals (Current goals can be found in the Care Plan section)  ?Acute Rehab PT Goals ?PT Goal Formulation: All assessment and education complete, DC therapy ? ?  ?Frequency   ?  ? ? ?Co-evaluation   ?  ?  ?  ?  ? ? ?  ?AM-PAC PT "6 Clicks" Mobility  ?Outcome Measure Help needed turning from your back to your side while in a flat bed without using bedrails?: None ?Help needed moving from lying on your back to sitting on the side of a flat bed without using bedrails?: None ?Help needed moving to and from a bed to a chair (including a wheelchair)?: None ?Help needed standing up from a  chair using your arms (e.g., wheelchair or bedside chair)?: None ?Help needed to walk in hospital room?: None ?Help needed climbing 3-5 steps with a railing? : None ?6 Click Score: 24 ? ?  ?End of Session   ?Activity Tolerance: Patient tolerated treatment well ?Patient left: in chair;with call bell/phone within reach;with chair alarm set ?Nurse Communication: Mobility status ?PT Visit Diagnosis: Other abnormalities of gait and mobility (R26.89) ?  ? ?Time: 5643-3295 ?PT Time Calculation (min) (ACUTE ONLY): 10 min ? ? ?Charges:   PT Evaluation ?$PT Eval Low Complexity: 1 Low ?  ?  ?   ? ? ?[facility] PT ?Acute Rehabilitation Services ?Pager [phone] ?Office [phone] ? ? ?Shary Decamp [facility] ?12/28/2021, 2:54 PM ? ?

## 2021-12-28 NOTE — Progress Notes (Signed)
Patient is active with [facility].  ?PCP: Dr Collins Scotland ?SWElizbeth Squires: [phone] ext [phone] ?

## 2021-12-28 NOTE — Consult Note (Incomplete)
Patient seen in late afternoon. Patient reports his mood is "bad" because his wife left him. He said the reason was that wife was scared of him. He reports forgetting the past couple of days in the hospital. Patient reports he was never diagnosed with catatonia. ? ?[facility]. Patient denies SI/HI/AVH today. He endorsed last SI in 2016 because of pain. ? ?Patient is oriented to location, city, year, month. ?Knowledge: able to name past 2 presidents ? ?Does a stone float on water: yes ?Fish in the sea: yes ?1 pound more than 2 pounds: no ?Does a hammer pound the nail: yes ? ?Attention: able to recall days of the week backwards, unable to say months of the year backwards ? 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 He has a wife, 3 daughters, 1 son, and grand-kids. Wife left him recently. Son is a Theme park manager. He attends church. ? ?Plan ?No medication changes.  ?Will see tomorrow. ?

## 2021-12-29 DIAGNOSIS — F1920 Other psychoactive substance dependence, uncomplicated: Secondary | ICD-10-CM | POA: Diagnosis not present

## 2021-12-29 DIAGNOSIS — F1990 Other psychoactive substance use, unspecified, uncomplicated: Secondary | ICD-10-CM

## 2021-12-29 DIAGNOSIS — N179 Acute kidney failure, unspecified: Secondary | ICD-10-CM | POA: Diagnosis not present

## 2021-12-29 DIAGNOSIS — F1320 Sedative, hypnotic or anxiolytic dependence, uncomplicated: Secondary | ICD-10-CM | POA: Diagnosis not present

## 2021-12-29 DIAGNOSIS — G9340 Encephalopathy, unspecified: Secondary | ICD-10-CM | POA: Diagnosis not present

## 2021-12-29 DIAGNOSIS — Z79899 Other long term (current) drug therapy: Secondary | ICD-10-CM | POA: Diagnosis not present

## 2021-12-29 DIAGNOSIS — F1120 Opioid dependence, uncomplicated: Secondary | ICD-10-CM | POA: Diagnosis not present

## 2021-12-29 DIAGNOSIS — F03918 Unspecified dementia, unspecified severity, with other behavioral disturbance: Secondary | ICD-10-CM

## 2021-12-29 MED ORDER — QUETIAPINE FUMARATE 50 MG PO TABS: 50.0000 mg | ORAL_TABLET | Freq: Every day | ORAL | 2 refills | Status: DC

## 2021-12-29 MED ORDER — ESCITALOPRAM OXALATE 10 MG PO TABS: 10.0000 mg | ORAL_TABLET | Freq: Every day | ORAL | Status: DC

## 2021-12-29 NOTE — TOC Transition Note (Signed)
Transition of Care (TOC) - CM/SW Discharge Note ? ? ?Patient Details  ?Name: Hayden Rose ?MRN: [id number] ?Date of Birth: [birth year] ? ?Transition of Care (TOC) CM/SW Contact:  ?Pollie Friar, RN ?Phone Number: ?12/29/2021, 11:37 AM ? ? ?Clinical Narrative:    ?Patient is discharging home with self care. No needs per PT. Pt states he has DME in the home but doesn't currently use any of it.  ?Patient drives self to appointments. He manages his own medications at home and denies any issues. He states he sets up his medications a month at a time in a pill box.  ?[facility].  ?CM has reached out to his son in law, per pt request, and he will provide transport home. ? ? ?Final next level of care: Home/Self Care ?Barriers to Discharge: No Barriers Identified ? ? ?Patient Goals and CMS Choice ?  ?  ?  ? ?Discharge Placement ?  ?           ?  ?  ?  ?  ? ?Discharge Plan and Services ?  ?  ?           ?  ?  ?  ?  ?  ?  ?  ?  ?  ?  ? ?Social Determinants of Health (SDOH) Interventions ?  ? ? ?Readmission Risk Interventions ?No flowsheet data found. ? ? ? ? ?

## 2021-12-29 NOTE — TOC CAGE-AID Note (Signed)
Transition of Care (TOC) - CAGE-AID Screening ? ? ?Patient Details  ?Name: Hayden Rose ?MRN: [id number] ?Date of Birth: Aug 03, 1951 ? ?Transition of Care (TOC) CM/SW Contact:    ?Pollie Friar, RN ?Phone Number: ?12/29/2021, 11:21 AM ? ? ?Clinical Narrative: ?Patient refused counseling resources for inpatient/ outpatient drug counseling. ? ? ?CAGE-AID Screening: ?  ? ?Have You Ever Felt You Ought to Cut Down on Your Drinking or Drug Use?: No ?Have People Annoyed You By Critizing Your Drinking Or Drug Use?: No ?Have You Felt Bad Or Guilty About Your Drinking Or Drug Use?: No ?Have You Ever Had a Drink or Used Drugs First Thing In The Morning to Steady Your Nerves or to Get Rid of a Hangover?: No ?CAGE-AID Score: 0 ? ?Substance Abuse Education Offered: Yes (pt refused) ? ?  ? ? ? ? ? ? ?

## 2021-12-29 NOTE — Consult Note (Signed)
[facility] Face-to-Face Psychiatry Consult  ? ?Reason for Consult:  "Confusion....very complicated patient...Marland Kitchen please see extensive neurology consultation....patient doesn't have a Neuro degenerative disorder per extensive w/u as outpatient... he mostly has issues w depression etc" ?Referring Physician:  Dr. Sloan Leiter ?Patient Identification: Hayden Rose ?MRN:  [id number] ?Principal Diagnosis: Acute encephalopathy ?Diagnosis:  Principal Problem: ?  Acute encephalopathy ?Active Problems: ?  Benzodiazepine dependence ([place]) ?  Polypharmacy ?  Opioid use disorder, moderate, dependence ([place]) ?  Dementia with behavioral disturbance ?  AKI (acute kidney injury) ([place]) ?  Altered mental state ? ? ?Total Time spent with patient: 30 minutes ? ?Subjective:   ?Hayden Rose is a 71 y.o. male with h/o history significant of reported vascular / lewy body dementia, CKD 3, prior opiate dependence, current benzo dependence, current THC use, hypothyroidism who presented to the ED with AMS and was admitted for further work up on 12/25/21. Neurology was consulted and work up was largely negative- see note from 3/18 by Dr. Leonel Ramsay. Patient sees a Dr. Rondel Oh at the [facility] who has documented (based on information by chart review) that patient is not believed to have a primary neurocognitive disorder despite often reporting cognitive issues although has had issues with polypharmacy. Psychiatry was consulted for assistance with AMS. ? ?3/21: Presented with continued improvement today compared to initial evaluation. He was able to discuss events over past ~48 hours however not earlier in hospitalization. Does remember what he has been told about early hospital course yesterday. Slight improvement  on brief bedside neurocognitive eval; was oriented to self, situation, month, year - now 6/12 on MOYB and DOWB quickly and with no errors. Discusses desire to go home and to never return to inpt psychiatry. No SI, HI, AH/VH. No guns in the home. Pt mildly  hyperreligious, however son in law is pastor, grandchildren are in religious home school - likely baseline.  ? ? 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 He has a wife, 3 daughters, 1 son, and grand-kids. Wife left him recently. Son is a Theme park manager. He attends church. ? ?Hayden,Rose (Spouse)  - 12/27/21 ?[phone] ([place]) ?Called at 12:20 pm ?-for safety issues has not been living with  patient recently.  ?-initially it started him off as lewy body dementia w cognitive impaitment and saw neuropsych who said he had vascular dementia d/t prior TIAs. [facility]" [facility])- [facility] Rondel [facility] who is a neuropsychiatrist. Dr. Rondel Oh told me he had dementia and said it would progress. Doctors at [facility] believe it and [place] doesn't believe it. Recent months has had a hard tome focusing. Daughter and SIL went to the home yeterday to get some clothes. He has been hoarding papers everywhere, no water in the toilet, bathtub was messed up,  whole bottle of xanax that has not been opened since February.  ?He has never been to the point where he cant put sentences together like how he is now.  has threatened to kill me and burn the place down within the last few months. Dont know where he gets his marijuana from, possibly something in it. Over the past 8-9 months he "is up or down and gets his days and nights mixed up".  ?He normally knows the month and the year ; states that  he called her 2 days before valentines day and said he was going to see her.  ?She called Dr. Rondel Oh in January to say he was "  going downhill".  ? ? ? ? ?Past Psychiatric History: per chart review MDD; per collateral obtained from neurology ?manic sx ? ?Risk to Self: no ?Risk to Others:no   ?Prior Inpatient Therapy:  yes per collateral obtained by neurology ?Prior Outpatient Therapy:  yes ? ?Past Medical History:  ?Past Medical History:  ?Diagnosis Date  ? Anxiety   ? Arthritis   ? Back  pain   ? chronic  ? Chronic kidney disease   ? stage III  renal disease   ? Degenerative disc disease   ? Depression   ? Dizziness   ? Dupuytren's contracture of right hand   ? Dyspnea   ? Elevated LFTs   ? Erectile dysfunction   ? Fall at home 08/12/2020  ? Hypothyroidism   ? Jaundice with stoppage of bile flow   ? after stent romoved  ? Lewy body dementia ([place])   ? Memory deficits 04/23/2013  ? Orthostatic hypotension   ? Osgood-Schlatter's disease   ? Restless leg syndrome 04/23/2013  ? Skin cancer   ? basal cell on back x2  ? Sleep apnea with use of continuous positive airway pressure (CPAP)   ? Tachycardia   ? heart monitor last week for 48 hours no report yet  ?  ?Past Surgical History:  ?Procedure Laterality Date  ? APPENDECTOMY    ? BASAL CELL CARCINOMA EXCISION  2008,2009  ? CARPAL TUNNEL RELEASE  2003  ? CERVICAL FUSION  1985  ? CHOLECYSTECTOMY    ? GALLBLADDER SURGERY  2007-2008  ? LIVER BIOPSY  12/27/2011  ? Procedure: LIVER BIOPSY;  Surgeon: Inda Castle, MD;  Location: [facility];  Service: Endoscopy;  Laterality: N/A;  pt moved from 3/22 to 3/18 ( aw)  ? [place]  ? TONSILLECTOMY    ? ?Family History:  ?Family History  ?Problem Relation Age of Onset  ? Coronary artery disease Mother   ? Heart attack Mother   ?     stents in neck and heart ?  ? Crohn's disease Mother   ? Cirrhosis Father   ? Cirrhosis Sister   ? Prostate cancer Maternal Uncle   ? Liver cancer Cousin   ? Colitis Daughter   ? Diabetes Maternal Uncle   ? Diabetes Brother   ? Kidney disease Maternal Aunt   ? Kidney disease Cousin   ? ?Family Psychiatric  History: unknown ?Social History:  ?Social History  ? ?Substance and Sexual Activity  ?Alcohol Use No  ?   ?Social History  ? ?Substance and Sexual Activity  ?Drug Use No  ? Comment: history of opiate abuse  ?  ?Social History  ? ?Socioeconomic History  ? Marital status: Married  ?  Spouse name: Barnetta Chapel  ? Number of children: 2  ? Years of education: 9th  ? Highest education  level: Not on file  ?Occupational History  ? Occupation: disabled  ?Tobacco Use  ? Smoking status: Former  ?  Types: Cigarettes  ?  Quit date: 12/27/1975  ?  Years since quitting: 46.0  ? Smokeless tobacco: Never  ?Vaping Use  ? Vaping Use: Never used  ?Substance and Sexual Activity  ? Alcohol use: No  ? Drug use: No  ?  Comment: history of opiate abuse  ? Sexual activity: Yes  ?  Birth control/protection: Condom  ?Other Topics Concern  ? Not on file  ?Social History Narrative  ? Patient is married Barnetta Chapel) and lives at home with his wife  and his mother.  ? Patient is disabled.  ? Patient has a 9th grade education.  ? Patient is right-handed.  ? Patient does not drink any caffeine.  ? Patient has two adult childre  ? Does not regularly exercise.   ? ?Social Determinants of Health  ? ?Financial Resource Strain: Not on file  ?Food Insecurity: Not on file  ?Transportation Needs: Not on file  ?Physical Activity: Not on file  ?Stress: Not on file  ?Social Connections: Not on file  ? ?Additional Social History: ?  ? ?Allergies:   ?Allergies  ?Allergen Reactions  ? Vesicare [Solifenacin Succinate] Anaphylaxis  ? Sulfa Antibiotics Swelling  ? Aricept [Donepezil Hydrochloride] Nausea And Vomiting and Other (See Comments)  ? Elemental Sulfur Swelling  ? Gabapentin   ? Lamictal [Lamotrigine]   ?  Petechia  ? Namenda [Memantine Hcl] Other (See Comments)  ?  unknown  ? Exelon [Rivastigmine] Rash  ?  patch  ? Fentanyl Rash  ?  patch  ? Penicillins Rash  ? ? ?Labs:  ?Results for orders placed or performed during the hospital encounter of 12/25/21 (from the past 48 hour(s))  ?CK     Status: None  ? Collection Time: 12/28/21  3:38 AM  ?Result Value Ref Range  ? Total CK 354 49 - 397 U/L  ?  Comment: Performed at [facility], [place] [address], [place], [place] [address]  ?Basic metabolic panel     Status: Abnormal  ? Collection Time: 12/28/21  3:38 AM  ?Result Value Ref Range  ? Sodium 134 (L) 135 - 145 mmol/L  ? Potassium 3.6  3.5 - 5.1 mmol/L  ? Chloride 103 98 - 111 mmol/L  ? CO2 24 22 - 32 mmol/L  ? Glucose, Bld 89 70 - 99 mg/dL  ?  Comment: Glucose reference range applies only to samples taken after fasting for at least

## 2021-12-29 NOTE — Discharge Summary (Signed)
? ?PATIENT DETAILS ?Name: Hayden Rose ?Age: 71 y.o. ?Sex: male ?Date of Birth: 06-03-1951 ?MRN: [id number]. ?Admitting Physician: Jonetta Osgood, MD ?TMA:[id number], [facility] ? ?Admit Date: 12/25/2021 ?Discharge date: 12/29/2021 ? ?Recommendations for Outpatient Follow-up:  ?Follow up with PCP in 1-2 weeks ?Please obtain CMP/CBC in one week ?Continue to optimize psych medications, continue to emphasize compliance ?Please ensure follow-up with neurology, psychiatry. ? ?Admitted From:  ?Home ? ?Disposition: ?Home ?  ?Discharge Condition: ?good ? ?CODE STATUS: ?  Code Status: Full Code  ? ?Diet recommendation:  ?Diet Order   ? ?       ?  Diet - low sodium heart healthy       ?  ?  Diet Heart Room service appropriate? Yes; Fluid consistency: Thin  Diet effective now       ?  ? ?  ?  ? ?  ?  ? ?Brief Summary: ?Patient is a 71 y.o.  male with history of ? vascular dementia/Lewy body dementia, prior psychiatric hospitalization for suicidal ideation-presented to the hospital with altered mental status-subsequently admitted to the hospitalist service for further evaluation and treatment. ?  ?Significant events: ?3/17>> admit to [facility] for altered mental status. ?  ?Significant studies: ?3/17>> CT head: No acute abnormalities. ?3/17>> CT abdomen/pelvis: 4 mm calcification in head of pancreas-prior cholecystectomy but no acute findings. ?3/17>> MRI brain: No acute intracranial abnormality ?3/18>> EEG: No seizures  ?  ?Significant microbiology data: ?3/17>> COVID/influenza PCR: Negative ?  ?Procedures: ?None ?  ?Consults: ?Psychiatry ?  ?Brief Hospital Course: ?Acute metabolic toxic encephalopathy: Unclear etiology-but could be related to polypharmacy/disturbed sleep/wake cycle and marijuana use superimposed on his psych issues that apparently has worsened recently due to marital discord.  MRI without any acute abnormalities-EEG without seizures.  Patient improved with just supportive care and without any particular  intervention.  Evaluated by neurology-no further recommendations-Per extensive review of chart/outpatient record-not felt to have neurodegenerative disorder.  Psychiatry also consulted-see below for.  Patient has been counseled regarding importance of taking medications exactly as prescribed-as polypharmacy may have been the culprit for his presentation for his presentation ?  ?AKI on CKD stage IIIb: AKI hemodynamically mediated-creatinine back to baseline.  ?  ?Rhabdomyolysis: CK levels of normalized. ?  ?Depression/anxiety disorder: Reviewed outpatient notes in care everywhere-apparently he has had worsening symptoms due to marital discord-history outpatient Lexapro/Cymbalta/Xanax was resumed-he was started on Seroquel per psychiatry.  Lexapro dosing has been decreased slightly.  Patient will need follow-up with outpatient psychiatry at the [facility].  I spoke at length with spouse a few days ago-and one of his daughters brooke-unclear whether he actually takes medications-family has doubts. ?  ?Hypothyroidism: TSH minimally elevated-continue levothyroxine-stable for outpatient follow-up with PCP for further optimization. ? ?History of peripheral neuropathy: Continue Lyrica, Cymbalta, Lexapro. ?  ?History of RLS: Managed with Sinemet ?  ?HLD: Resume statins on discharge-as CK has normalized. ?  ?Marijuana use ?  ?BMI: ?Estimated body mass index is 27.32 kg/m? as calculated from the following: ?  Height as of this encounter: '5\' 8"'$  (1.727 m). ?  Weight as of this encounter: 81.5 kg.  ?  ? ? Diagnoses:  ?Principal Problem: ?  Acute encephalopathy ?Active Problems: ?  Benzodiazepine dependence ([place]) ?  Polypharmacy ?  Dementia with behavioral disturbance ?  AKI (acute kidney injury) ([place]) ?  Opioid use disorder, moderate, dependence ([place]) ?  Altered mental state ? ? ?Discharge Instructions: ? ?Activity:  ?As tolerated  ? ?  Discharge Instructions   ? ? Diet - low sodium heart healthy   Complete by: As directed ?   ? Discharge instructions   Complete by: As directed ?  ? Follow with [facility] in 1-2 weeks ? ?Please get a complete blood count and chemistry panel checked by your Primary MD at your next visit, and again as instructed by your Primary MD. ? ?Get Medicines reviewed and adjusted: ?Please take all your medications with you for your next visit with your Primary MD ? ?Laboratory/radiological data: ?Please request your Primary MD to go over all hospital tests and procedure/radiological results at the follow up, please ask your Primary MD to get all Hospital records sent to his/her office. ? ?In some cases, they will be blood work, cultures and biopsy results pending at the time of your discharge. Please request that your primary care M.D. follows up on these results. ? ?Also Note the following: ?If you experience worsening of your admission symptoms, develop shortness of breath, life threatening emergency, suicidal or homicidal thoughts you must seek medical attention immediately by calling 911 or calling your MD immediately  if symptoms less severe. ? ?You must read complete instructions/literature along with all the possible adverse reactions/side effects for all the Medicines you take and that have been prescribed to you. Take any new Medicines after you have completely understood and accpet all the possible adverse reactions/side effects.  ? ?Do not drive when taking Pain medications or sleeping medications (Benzodaizepines) ? ?Do not take more than prescribed Pain, Sleep and Anxiety Medications. It is not advisable to combine anxiety,sleep and pain medications without talking with your primary care practitioner ? ?Special Instructions: If you have smoked or chewed Tobacco  in the last 2 yrs please stop smoking, stop any regular Alcohol  and or any Recreational drug use. ? ?Wear Seat belts while driving. ? ?Please note: ?You were cared for by a hospitalist during your hospital stay. Once  you are discharged, your primary care physician will handle any further medical issues. Please note that NO REFILLS for any discharge medications will be authorized once you are discharged, as it is imperative that you return to your primary care physician (or establish a relationship with a primary care physician if you do not have one) for your post hospital discharge needs so that they can reassess your need for medications and monitor your lab values.  ? Increase activity slowly   Complete by: As directed ?  ? ?  ? ?Allergies as of 12/29/2021   ? ?   Reactions  ? Vesicare [solifenacin Succinate] Anaphylaxis  ? Sulfa Antibiotics Swelling  ? Aricept [donepezil Hydrochloride] Nausea And Vomiting, Other (See Comments)  ? Elemental Sulfur Swelling  ? Gabapentin   ? Lamictal [lamotrigine]   ? Petechia  ? Namenda [memantine Hcl] Other (See Comments)  ? unknown  ? Exelon [rivastigmine] Rash  ? patch  ? Fentanyl Rash  ? patch  ? Penicillins Rash  ? ?  ? ?  ?Medication List  ?  ? ?STOP taking these medications   ? ?risperiDONE 0.5 MG tablet ?Commonly known as: RISPERDAL ?  ?rOPINIRole 0.25 MG tablet ?Commonly known as: REQUIP ?  ? ?  ? ?TAKE these medications   ? ?acetaminophen 500 MG tablet ?Commonly known as: TYLENOL ?Take 2 tablets (1,000 mg total) by mouth every 6 (six) hours as needed. ?  ?ALPRAZolam 1 MG tablet ?Commonly known as: Duanne Moron ?Take 1 mg by mouth  2 (two) times daily. ?  ?aspirin 81 MG chewable tablet ?Chew 1 tablet (81 mg total) by mouth daily. ?  ?atorvastatin 10 MG tablet ?Commonly known as: LIPITOR ?Take 10 mg by mouth at bedtime. ?  ?atorvastatin 80 MG tablet ?Commonly known as: LIPITOR ?Take 1 tablet (80 mg total) by mouth daily at 6 PM. ?  ?carbidopa-levodopa 25-100 MG tablet ?Commonly known as: SINEMET IR ?Take 2 tablets by mouth See admin instructions. Take 1 tablet by mouth twice a day and two tablets once a day with supper, and take 2 tablets at bedtime ?  ?Carboxymethylcellulose Sod PF 1 %  Gel ?Place 1 drop into both eyes at bedtime. ?  ?colestipol 1 g tablet ?Commonly known as: COLESTID ?Take 1 g by mouth 2 (two) times daily. ?  ?diphenoxylate-atropine 2.5-0.025 MG tablet ?Commonly known as: LOMOTIL ?T

## 2021-12-29 NOTE — Progress Notes (Signed)
Discharge instructions (including medications) discussed with and copy provided to patient/caregiver. All belongings sent with patient. 

## 2024-06-19 ENCOUNTER — Encounter (INDEPENDENT_AMBULATORY_CARE_PROVIDER_SITE_OTHER): Payer: Self-pay

## 2024-07-16 ENCOUNTER — Encounter (INDEPENDENT_AMBULATORY_CARE_PROVIDER_SITE_OTHER): Payer: Self-pay | Admitting: Otolaryngology

## 2024-07-16 ENCOUNTER — Ambulatory Visit (INDEPENDENT_AMBULATORY_CARE_PROVIDER_SITE_OTHER): Admitting: Otolaryngology

## 2024-07-16 VITALS — BP 146/66 | HR 69 | Temp 98.9°F

## 2024-07-16 DIAGNOSIS — H9313 Tinnitus, bilateral: Secondary | ICD-10-CM | POA: Diagnosis not present

## 2024-07-16 DIAGNOSIS — R0981 Nasal congestion: Secondary | ICD-10-CM | POA: Diagnosis not present

## 2024-07-16 DIAGNOSIS — R0982 Postnasal drip: Secondary | ICD-10-CM

## 2024-07-16 DIAGNOSIS — H906 Mixed conductive and sensorineural hearing loss, bilateral: Secondary | ICD-10-CM | POA: Diagnosis not present

## 2024-07-16 DIAGNOSIS — J3089 Other allergic rhinitis: Secondary | ICD-10-CM

## 2024-07-16 DIAGNOSIS — G4733 Obstructive sleep apnea (adult) (pediatric): Secondary | ICD-10-CM

## 2024-07-16 DIAGNOSIS — H9193 Unspecified hearing loss, bilateral: Secondary | ICD-10-CM

## 2024-07-16 MED ORDER — FLUTICASONE PROPIONATE 50 MCG/ACT NA SUSP: 2.0000 | Freq: Two times a day (BID) | NASAL | 6 refills | Status: DC

## 2024-07-16 MED ORDER — CETIRIZINE HCL 10 MG PO TABS: 10.0000 mg | ORAL_TABLET | Freq: Every day | ORAL | 11 refills | Status: DC

## 2024-07-16 NOTE — Progress Notes (Signed)
 ENT CONSULT:  Reason for Consult: hearing loss   HPI: Discussed the use of AI scribe software for clinical note transcription with the patient, who gave verbal consent to proceed.  History of Present Illness Hayden Rose is a 73 year old male who presents with worsening hearing loss and nasal congestion. He had a hearing test with mixed hearing loss.   He has experienced ringing in his head for years, which he associates with his hearing loss. He struggles to hear people at church unless he speaks loudly and clearly. He recalls significant earwax removal as a child, which temporarily improved his hearing, but he continued to have difficulty with speech due to hearing issues.  He describes nasal congestion, attributing it to seasonal factors like pollen. He uses DayQuil, NyQuil, and Zyrtec, which provide some relief but do not completely resolve the symptoms.  He uses a CPAP machine for OSA and believes it may contribute to his nasal congestion. He has not been tested for allergies and has not had any ear surgeries. He had a tonsillectomy/adenoidectomy when he was a child.  Records Reviewed:  [facility] office visit  Clearnce Gore PA-C 06/27/24 Cervical radiculopathy  Pleasant 71 when gentleman comes in today for follow-up. I reviewed his comprehensive history and physical exam there are no significant changes. Continues to complain of a pinching left trapezius pain that radiates down the left arm in a C8 distribution. He complains of numbness and tingling in the left 4th and 5th digits with some weakness in the hand     Past Medical History:  Diagnosis Date   Anxiety    Arthritis    Back pain    chronic   Chronic kidney disease    stage III  renal disease    Degenerative disc disease    Depression    Dizziness    Dupuytren's contracture of right hand    Dyspnea    Elevated LFTs    Erectile dysfunction    Fall at home 08/12/2020   Hypothyroidism    Jaundice with stoppage of bile  flow    after stent romoved   Lewy body dementia (HCC)    Memory deficits 04/23/2013   Orthostatic hypotension    Osgood-Schlatter's disease    Restless leg syndrome 04/23/2013   Skin cancer    basal cell on back x2   Sleep apnea with use of continuous positive airway pressure (CPAP)    Tachycardia    heart monitor last week for 48 hours no report yet    Past Surgical History:  Procedure Laterality Date   APPENDECTOMY     BASAL CELL CARCINOMA EXCISION  2008,2009   CARPAL TUNNEL RELEASE  2003   CERVICAL FUSION  1985   CHOLECYSTECTOMY     GALLBLADDER SURGERY  2007-2008   LIVER BIOPSY  12/27/2011   Procedure: LIVER BIOPSY;  Surgeon: Lamar JONETTA Aho, MD;  Location: [facility];  Service: Endoscopy;  Laterality: N/A;  pt moved from 3/22 to 3/18 ( aw)   LUMBAR FUSION  1996   TONSILLECTOMY      Family History  Problem Relation Age of Onset   Coronary artery disease Mother    Heart attack Mother        stents in neck and heart ?   Crohn's disease Mother    Cirrhosis Father    Cirrhosis Sister    Prostate cancer Maternal Uncle    Liver cancer Cousin    Colitis Daughter  Diabetes Maternal Uncle    Diabetes Brother    Kidney disease Maternal Aunt    Kidney disease Cousin     Social History:  reports that he quit smoking about 48 years ago. His smoking use included cigarettes. He has never used smokeless tobacco. He reports that he does not drink alcohol  and does not use drugs.  Allergies:  Allergies  Allergen Reactions   Solifenacin Succinate Anaphylaxis   Fentanyl  Rash and Dermatitis    patch   Sulfa Antibiotics Swelling   Donepezil Hydrochloride Nausea And Vomiting and Nausea Only   Elemental Sulfur Swelling   Lamotrigine     Petechia   Memantine Other (See Comments)    unknown   Namenda [Memantine Hcl] Other (See Comments)    unknown   Oxcarbazepine Nausea And Vomiting   Sulfur Swelling   Gabapentin Swelling   Penicillin G Rash   Penicillins Rash    Rivastigmine Rash    patch    Medications: I have reviewed the patient's current medications.  The PMH, PSH, Medications, Allergies, and SH were reviewed and updated.  ROS: Constitutional: Negative for fever, weight loss and weight gain. Cardiovascular: Negative for chest pain and dyspnea on exertion. Respiratory: Is not experiencing shortness of breath at rest. Gastrointestinal: Negative for nausea and vomiting. Neurological: Negative for headaches. Psychiatric: The patient is not nervous/anxious  Blood pressure (!) 146/66, pulse 69, temperature 98.9 F (37.2 C), SpO2 96%.  PHYSICAL EXAM:  Exam: General: Well-developed, well-nourished Respiratory Respiratory effort: Equal inspiration and expiration without stridor Cardiovascular Peripheral Vascular: Warm extremities with equal color/perfusion Eyes: No nystagmus with equal extraocular motion bilaterally Neuro/Psych/Balance: Patient oriented to person, place, and time; Appropriate mood and affect; Gait is intact with no imbalance; Cranial nerves I-XII are intact Head and Face Inspection: Normocephalic and atraumatic without mass or lesion Palpation: Facial skeleton intact without bony stepoffs Salivary Glands: No mass or tenderness Facial Strength: Facial motility symmetric and full bilaterally ENT Pinna: External ear intact and fully developed External canal: Canal is patent with intact skin Tympanic Membrane: Clear but cannot rule out fluid External Nose: No scar or anatomic deformity Internal Nose: Septum intact and midline. No edema, polyp, or rhinorrhea Lips, Teeth, and gums: Mucosa and teeth intact and viable TMJ: No pain to palpation with full mobility Oral cavity/oropharynx: No erythema or exudate, no lesions present Neck Neck and Trachea: Midline trachea without mass or lesion Thyroid : No mass or nodularity Lymphatics: No lymphadenopathy   Studies Reviewed: Audiogram from [place]  05/31/24   Assessment/Plan: Encounter Diagnoses  Name Primary?   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 Differential includes middle ear fluid or ossicular issues. Imaging considered if conductive loss persists post-retest and if no fluid suspected in the middle ear.  - Schedule repeat hearing test  - Consider imaging if conductive loss persists after retest.  Bilateral tinnitus Tinnitus likely secondary to hearing loss, common brain response to auditory deficit. - Recommend white noise machine. - coping strategies discussed   Chronic Nasal congestion and suspected environmental allergies Nasal congestion possibly worsened by CPAP. Current medications provide limited relief. CPAP  humidification settings discussed. We also discussed allergy testing  - Prescribed Flonase nasal spray and Xyzal 5 mg for daily use. - Advised maintaining current CPAP humidification settings. - will consider allergy testing in the future     Thank you for allowing me to participate in the care of this patient. Please do not hesitate to contact me with any questions or concerns.   Elena Larry, MD Otolaryngology [facility] Phone: [phone] Fax: [phone]    07/16/2024, 8:41 AM

## 2024-07-25 ENCOUNTER — Telehealth (INDEPENDENT_AMBULATORY_CARE_PROVIDER_SITE_OTHER): Payer: Self-pay

## 2024-07-25 NOTE — Telephone Encounter (Signed)
 Hayden Rose LVM to Call her back about request for add on services. ! Service was included in their request and the other needs a form filled out and faxed in. Call her at [phone].

## 2024-08-17 ENCOUNTER — Ambulatory Visit (INDEPENDENT_AMBULATORY_CARE_PROVIDER_SITE_OTHER): Admitting: Audiology

## 2024-08-17 DIAGNOSIS — H906 Mixed conductive and sensorineural hearing loss, bilateral: Secondary | ICD-10-CM

## 2024-08-17 NOTE — Progress Notes (Signed)
  [address], [address] [place], [place] [address] [phone]  Audiological Evaluation    Name: Hayden Rose     DOB:   04/26/1951      MRN:   [id number]                                                                                     Service Date: 08/17/2024     Accompanied by: unaccompanied   Patient comes today after Reyes Cohen, PA-C and Dr. Okey sent a referral for a hearing evaluation due to concerns with hearing loss.   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 Left ear:  Clear external ear canal and notable landmarks visualized on the tympanic membrane.  Tympanometry: Right ear: Normal external ear canal volume with no middle ear pressure peak or tympanic membrane compliance (Type B).   Findings are suggestive of abnormal middle ear function, such as middle ear effusion. Results are consistent with the presence of middle ear effusion. Left ear: Normal external ear canal volume with no middle ear pressure peak or tympanic membrane compliance (Type B).   Findings are suggestive of abnormal middle ear function, such as middle ear effusion. Results are consistent with the presence of middle ear effusion.  Hearing Evaluation The hearing test results were completed under headphones (used inserts for [facility] masking) and results are deemed to be of good reliability. Test technique:  conventional    Pure tone Audiometry: Right ear- mild to moderately severe mixed hearing loss from 250 Hz - 8000 Hz. Left ear-  borderline normal to moderate mixed hearing  loss from 250 Hz - 8000 Hz.  Speech Audiometry: Right ear- Speech Reception Threshold (SRT) was obtained at 35 dBHL. Left ear-Speech Reception Threshold (SRT) was obtained at 25 dBHL.   Word Recognition Score Tested using NU-6 (recorded) Right ear: 100% was obtained at a presentation level of 80 dBHL with contralateral masking which is deemed as  excellent. Left ear: 100% was obtained at a presentation level of 70 dBHL with contralateral masking which is deemed as  excellent.     Recommendations: Follow up with ENT as scheduled for today. Repeat audiogram after medical care.   Kadince Boxley MARIE LEROUX-MARTINEZ, AUD

## 2024-08-22 ENCOUNTER — Encounter: Payer: Self-pay | Admitting: Audiology

## 2024-09-03 ENCOUNTER — Encounter (INDEPENDENT_AMBULATORY_CARE_PROVIDER_SITE_OTHER): Payer: Self-pay | Admitting: Otolaryngology

## 2024-09-03 ENCOUNTER — Ambulatory Visit (INDEPENDENT_AMBULATORY_CARE_PROVIDER_SITE_OTHER): Admitting: Otolaryngology

## 2024-09-03 VITALS — BP 188/79 | HR 83 | Ht 68.0 in | Wt 180.0 lb

## 2024-09-03 DIAGNOSIS — H906 Mixed conductive and sensorineural hearing loss, bilateral: Secondary | ICD-10-CM | POA: Diagnosis not present

## 2024-09-03 DIAGNOSIS — H9313 Tinnitus, bilateral: Secondary | ICD-10-CM

## 2024-09-03 MED ORDER — FLUTICASONE PROPIONATE 50 MCG/ACT NA SUSP: 2.0000 | Freq: Every day | NASAL | 6 refills | Status: AC

## 2024-09-03 MED ORDER — FLUTICASONE PROPIONATE 50 MCG/ACT NA SUSP: 2.0000 | Freq: Every day | NASAL | 6 refills | Status: DC

## 2024-09-03 NOTE — Patient Instructions (Signed)
 Use flonase  two sprays each nostril once daily I have ordered an imaging study for you to complete prior to your next visit. Please call [facility] Scheduling at [phone] to schedule your imaging if you have not received a call within 24 hours. If you are unable to complete your imaging study prior to your next scheduled visit please call our office to let us  know.

## 2024-09-03 NOTE — Progress Notes (Signed)
 Otolaryngology Clinic Note HPI:  Hayden Rose is a 73 y.o. male kindly referred for evaluation of hearing loss  Initial visit with me (08/2024): Discussed the use of AI scribe software for clinical note transcription with the patient, who gave verbal consent to proceed.  History of Present Illness Hayden Rose is a 73 year old male who presents with worsening hearing loss and tinnitus.  He has experienced progressive hearing loss for many years, with difficulty understanding conversations unless directly facing the speaker. The hearing loss is more pronounced in the right ear, though he cannot discern a difference.  He has also persistent bilateral non-pulsatile tinnitus is described as a ringing sensation in the middle of his head. There is no ear pain, fullness, or drainage. No recent ear infections or vertigo are reported.  He reports sinus issues, particularly nasal congestion at night and in the morning, with clear nasal discharge. No allergy symptoms or frequent sinus infections are reported.   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 No sound or pressure induced vertigo.  H&N Surgery: T&A Personal or FHx of bleeding dz or anesthesia difficulty: no  AP/AC: ASA 81  Tobacco: former, quit  PMHx: HTN, h/o substance use (remote), Hypothyroidism, TIA  Independent Review of Additional Tests or Records:  Dr. Okey (07/16/2024): noted worsening hearing, bilateral tinnitus for years. Congestion with allergies; Dx: Mixed hearing loss; repeat HT, consider imaging; b/l tinnitus Labs CMP 03/15/2024: BUN/Cr 30/1.37 [facility] 05/31/2024: AD/AS - C/As tymps; WRT 96/100% AD/AS at 70dB  HL   08/2024 Audiogram was independently reviewed and interpreted by me and it reveals - B/B tymps; WRT 100% AU at 80/70dB AD/AS - noted bilateral mixed hearing loss worst in lower frequencies then becomes essentially SNHL mod on left and right with small ABG 6K Hz.    SNHL= Sensorineural hearing loss  CT Head 12/25/2021 independently interpreted with respect to ears: cuts thick so suboptimal eval but noted to have sclerotic mastoids b/l with antrum b/l aerted with aerated b/l ME space; no noted ossicular chain or otic capsule pathology  PMH/Meds/All/SocHx/FamHx/ROS:   Past Medical History:  Diagnosis Date   Anxiety    Arthritis    Back pain    chronic   Chronic kidney disease    stage III  renal disease    Degenerative disc disease    Depression    Dizziness    Dupuytren's contracture of right hand    Dyspnea    Elevated LFTs    Erectile dysfunction    Fall at home 08/12/2020   Hypothyroidism    Jaundice with stoppage of bile flow    after stent romoved   Lewy body dementia (HCC)    Memory deficits 04/23/2013   Orthostatic hypotension    Osgood-Schlatter's disease    Restless leg syndrome 04/23/2013   Skin cancer    basal cell on back x2   Sleep apnea with use of continuous positive airway pressure (CPAP)    Tachycardia    heart monitor last week for 48 hours no report yet     Past Surgical History:  Procedure Laterality Date   APPENDECTOMY     BASAL CELL CARCINOMA EXCISION  2008,2009   CARPAL TUNNEL RELEASE  2003  CERVICAL FUSION  1985   CHOLECYSTECTOMY     GALLBLADDER SURGERY  2007-2008   LIVER BIOPSY  12/27/2011   Procedure: LIVER BIOPSY;  Surgeon: Lamar JONETTA Aho, MD;  Location: [facility];  Service: Endoscopy;  Laterality: N/A;  pt moved from 3/22 to 3/18 ( aw)   LUMBAR FUSION  1996   TONSILLECTOMY      Family History  Problem Relation Age of Onset   Coronary artery disease Mother    Heart attack Mother        stents in neck and heart ?   Crohn's  disease Mother    Cirrhosis Father    Cirrhosis Sister    Prostate cancer Maternal Uncle    Liver cancer Cousin    Colitis Daughter    Diabetes Maternal Uncle    Diabetes Brother    Kidney disease Maternal Aunt    Kidney disease Cousin      Social Connections: Not on file      Current Outpatient Medications:    ALPRAZolam  (XANAX ) 1 MG tablet, Take 1 mg by mouth 2 (two) times daily., Disp: , Rfl:    aspirin  81 MG chewable tablet, Chew 1 tablet (81 mg total) by mouth daily., Disp: 30 tablet, Rfl: 0   atorvastatin  (LIPITOR ) 10 MG tablet, Take 10 mg by mouth at bedtime., Disp: , Rfl:    busPIRone  (BUSPAR ) 10 MG tablet, Take 10 mg by mouth 2 (two) times daily., Disp: , Rfl:    carbidopa -levodopa  (SINEMET  IR) 25-100 MG tablet, Take 2 tablets by mouth See admin instructions. Take 1 tablet by mouth twice a day and two tablets once a day with supper, and take 2 tablets at bedtime, Disp: , Rfl:    Carboxymethylcellulose Sod PF 1 % GEL, Place 1 drop into both eyes at bedtime., Disp: , Rfl:    cetirizine  (ZYRTEC ) 10 MG tablet, Take 1 tablet (10 mg total) by mouth daily., Disp: 30 tablet, Rfl: 11   colestipol  (COLESTID ) 1 g tablet, Take 1 g by mouth 2 (two) times daily., Disp: , Rfl:    Cyanocobalamin (B-12) 5000 MCG CAPS, Take 1 tablet by mouth daily., Disp: , Rfl:    diphenoxylate -atropine  (LOMOTIL ) 2.5-0.025 MG tablet, Take 1 tablet by mouth daily., Disp: , Rfl:    DULoxetine  (CYMBALTA ) 30 MG capsule, Take 30 mg by mouth 2 (two) times daily., Disp: , Rfl:    escitalopram  (LEXAPRO ) 10 MG tablet, Take 1 tablet (10 mg total) by mouth daily., Disp: , Rfl:    gabapentin (NEURONTIN) 100 MG capsule, Take 200 mg by mouth., Disp: , Rfl:    hydrochlorothiazide  (HYDRODIURIL ) 25 MG tablet, Take 25 mg by mouth daily., Disp: , Rfl:    HYDROcodone -acetaminophen  (NORCO/VICODIN) 5-325 MG tablet, Take by mouth., Disp: , Rfl:    Levothyroxine  Sodium 112 MCG CAPS, Take 137 mcg by mouth daily before breakfast.,  Disp: , Rfl:    lidocaine  (LIDODERM ) 5 %, Place onto the skin., Disp: , Rfl:    losartan (COZAAR) 50 MG tablet, Take 25 mg by mouth daily., Disp: , Rfl:    methocarbamol  (ROBAXIN ) 500 MG tablet, Take 500 mg by mouth 3 (three) times daily as needed., Disp: , Rfl:    mirtazapine (REMERON) 30 MG tablet, Take 30 mg by mouth., Disp: , Rfl:    Multiple Vitamin (MULTIVITAMIN WITH MINERALS) TABS tablet, Take 1 tablet by mouth daily., Disp: , Rfl:    potassium chloride  20 MEQ/15ML (10%) SOLN, Take 20 mEq by mouth daily.,  Disp: , Rfl:    pregabalin  (LYRICA ) 200 MG capsule, Take 200 mg by mouth in the morning, at noon, and at bedtime., Disp: , Rfl:    psyllium (METAMUCIL) 58.6 % powder, Take 1 packet by mouth See admin instructions. Take 1 tablespoonful by mouth daily (mix with 8 oz of water or juice), Disp: , Rfl:    QUEtiapine  (SEROQUEL ) 50 MG tablet, Take 1 tablet (50 mg total) by mouth at bedtime., Disp: 30 tablet, Rfl: 2   terazosin (HYTRIN) 5 MG capsule, Take 5 mg by mouth at bedtime., Disp: , Rfl:    acetaminophen  (TYLENOL ) 500 MG tablet, Take 2 tablets (1,000 mg total) by mouth every 6 (six) hours as needed. (Patient not taking: Reported on 09/03/2024), Disp: 30 tablet, Rfl: 0   atorvastatin  (LIPITOR ) 80 MG tablet, Take 1 tablet (80 mg total) by mouth daily at 6 PM. (Patient not taking: Reported on 09/03/2024), Disp: 30 tablet, Rfl: 0   fluticasone  (FLONASE ) 50 MCG/ACT nasal spray, Place 2 sprays into both nostrils daily., Disp: 16 g, Rfl: 6   Physical Exam:   BP (!) 188/79 (BP Location: Right Arm, Patient Position: Sitting, Cuff Size: Large)   Pulse 83   Ht 5' 8 (1.727 m)   Wt 180 lb (81.6 kg)   SpO2 97%   BMI 27.37 kg/m   Salient findings:  CN II-XII intact Given history and complaints, ear microscopy was indicated and performed for evaluation with findings as below in physical exam section and in procedures;  Bilateral EAC clear and TM intact with well pneumatized middle ear spaces - fair  amount of diffuse myringosclerosis, modest pars flaccida retraction AU; no evidence of cholesteatoma Weber 512: reports mid Rin[place] 512: AC > BC b/l  Anterior rhinoscopy: Septum intact; bilateral inferior turbinates without significant hypertrophy No lesions of oral cavity/oropharynx No obviously palpable neck masses/lymphadenopathy/thyromegaly No respiratory distress or stridor  Seprately Identifiable Procedures:  Prior to initiating any procedures, risks/benefits/alternatives were explained to the patient and verbal consent obtained Procedure: Bilateral ear microscopy using microscope (CPT 92504) Pre-procedure diagnosis: bilateral mixed hearing loss Post-procedure diagnosis: same Indication: see above; given patient's otologic complaints and history, for improved and comprehensive examination of external ear and tympanic membrane, bilateral otologic examination using microscope was performed. Prior to proceeding, verbal consent was obtained after discussion of R/B/A  Procedure: Patient was placed semi-recumbent. Both ear canals were examined using the microscope with findings above. Patient tolerated the procedure well.   Impression & Plans:  Dreydon Cardenas is a 73 y.o. male with:  1. Mixed conductive and sensorineural hearing loss of both ears   2. Tinnitus of both ears    Unclear etiology for conductive component -- perhaps Otosclerosis v/s just tympanosclerosis? Regardless, will need CT He does have some nasal congestion and likely ETD so will treat for that with flonase  - f/u 8 weeks by phone  with CT  See below regarding exact medications prescribed this encounter including dosages and route: Meds ordered this encounter  Medications   DISCONTD: fluticasone  (FLONASE ) 50 MCG/ACT nasal spray    Sig: Place 2 sprays into both nostrils daily.    Dispense:  16 g    Refill:  6   fluticasone  (FLONASE ) 50 MCG/ACT nasal spray    Sig: Place 2 sprays into both nostrils daily.    Dispense:   16 g    Refill:  6      Thank you for allowing me the opportunity to care for your patient. Please  do not hesitate to contact me should you have any other questions.  Sincerely, Eldora Blanch, MD Otolaryngologist (ENT), [facility] Phone: [phone] Fax: [phone]  09/03/2024, 1:10 PM   MDM:  I have personally spent 42 minutes involved in face-to-face and non-face-to-face activities for this patient on the day of the visit.  Professional time spent excludes any procedures performed but includes the following activities, in addition to those noted in the documentation: preparing to see the patient (review of outside documentation and results), performing a medically appropriate examination, counseling, documenting in the electronic health record, independently interpreting results (CT, outside Audiogram).

## 2024-09-12 ENCOUNTER — Other Ambulatory Visit: Payer: Self-pay | Admitting: Neurological Surgery

## 2024-09-21 ENCOUNTER — Encounter (HOSPITAL_COMMUNITY): Payer: Self-pay

## 2024-09-21 ENCOUNTER — Ambulatory Visit (HOSPITAL_COMMUNITY): Admission: RE | Admit: 2024-09-21 | Discharge: 2024-09-21 | Attending: Otolaryngology

## 2024-09-21 DIAGNOSIS — H9313 Tinnitus, bilateral: Secondary | ICD-10-CM

## 2024-09-21 DIAGNOSIS — H906 Mixed conductive and sensorineural hearing loss, bilateral: Secondary | ICD-10-CM

## 2024-10-05 ENCOUNTER — Encounter (HOSPITAL_COMMUNITY): Payer: Self-pay

## 2024-10-05 NOTE — Progress Notes (Signed)
 Surgical Instructions   Your procedure is scheduled on Monday October 15, 2024. Report to [facility] Main Entrance A at 7:55 A.M., then check in with the Admitting office. Any questions or running late day of surgery: call [phone]  Questions prior to your surgery date: call [phone], Monday-Friday, 8am-4pm. If you experience any cold or flu symptoms such as cough, fever, chills, shortness of breath, etc. between now and your scheduled surgery, please notify us  at the above number.     Remember:  Do not eat or drink after midnight the night before your surgery  Take these medicines the morning of surgery with A SIP OF WATER  busPIRone  (BUSPAR )  carbidopa -levodopa  (SINEMET  IR)  diphenoxylate -atropine  (LOMOTIL )  gabapentin (NEURONTIN)  levothyroxine  (SYNTHROID )   One week prior to surgery, STOP taking any Aspirin  (unless otherwise instructed by your surgeon) Aleve, Naproxen, Ibuprofen, Motrin, Advil, Goody's, BC's, all herbal medications, fish oil, and non-prescription vitamins.                     Do NOT Smoke (Tobacco/Vaping) for 24 hours prior to your procedure.  If you use a CPAP at night, you may bring your mask/headgear for your overnight stay.   You will be asked to remove any contacts, glasses, piercing's, hearing aid's, dentures/partials prior to surgery. Please bring cases for these items if needed.    Patients discharged the day of surgery will not be allowed to drive home, and someone needs to stay with them for 24 hours.  SURGICAL WAITING ROOM VISITATION Patients may have no more than 2 support people in the waiting area - these visitors may rotate.   Pre-op nurse will coordinate an appropriate time for 1 ADULT support person, who may not rotate, to accompany patient in pre-op.  Children under the age of 7 must have an adult with them who is not the patient and must remain in the main waiting area with an adult.  If the patient needs to stay at the hospital  during part of their recovery, the visitor guidelines for inpatient rooms apply.  Please refer to the [facility] website for the visitor guidelines for any additional information.   If you received a COVID test during your pre-op visit  it is requested that you wear a mask when out in public, stay away from anyone that may not be feeling well and notify your surgeon if you develop symptoms. If you have been in contact with anyone that has tested positive in the last 10 days please notify you surgeon.      Pre-operative 4 CHG Bathing Instructions   You can play a key role in reducing the risk of infection after surgery. Your skin needs to be as free of germs as possible. You can reduce the number of germs on your skin by washing with CHG (chlorhexidine gluconate) soap before surgery. CHG is an antiseptic soap that kills germs and continues to kill germs even after washing.   DO NOT use if you have an allergy to chlorhexidine/CHG or antibacterial soaps. If your skin becomes reddened or irritated, stop using the CHG and notify one of our RNs at [phone].   Please shower with the CHG soap starting 4 days before surgery using the following schedule:     Please keep in mind the following:  You may shave your face at any point before/day of surgery.  Place clean sheets on your bed the day you start using CHG soap. Use a clean  washcloth (not used since being washed) for each shower. DO NOT sleep with pets once you start using the CHG.   CHG Shower Instructions:  Wash your face and private area with normal soap. If you choose to wash your hair, wash first with your normal shampoo.  After you use shampoo/soap, rinse your hair and body thoroughly to remove shampoo/soap residue.  Turn the water OFF and apply  bottle of CHG soap to a CLEAN washcloth.  Apply CHG soap ONLY FROM YOUR NECK DOWN TO YOUR TOES (washing for 3-5 minutes)  DO NOT use CHG soap on face, private areas, open wounds, or  sores.  Pay special attention to the area where your surgery is being performed.  If you are having back surgery, having someone wash your back for you may be helpful. Wait 2 minutes after CHG soap is applied, then you may rinse off the CHG soap.  Pat dry with a clean towel  Put on clean clothes/pajamas   If you choose to wear lotion, please use ONLY the CHG-compatible lotions that are listed below.  Additional instructions for the day of surgery:  If you choose, you may shower the morning of surgery with an antibacterial soap.  DO NOT APPLY any lotions, deodorants or cologne Do not bring valuables to the hospital. [facility] is not responsible for any belongings/valuables. Do not wear jewelry Put on clean/comfortable clothes.  Please brush your teeth.  Ask your nurse before applying any prescription medications to the skin.     CHG Compatible Lotions   Aveeno Moisturizing lotion  Cetaphil Moisturizing Cream  Cetaphil Moisturizing Lotion  Clairol Herbal Essence Moisturizing Lotion, Dry Skin  Clairol Herbal Essence Moisturizing Lotion, Extra Dry Skin  Clairol Herbal Essence Moisturizing Lotion, Normal Skin  Curel Age Defying Therapeutic Moisturizing Lotion with Alpha Hydroxy  Curel Extreme Care Body Lotion  Curel Soothing Hands Moisturizing Hand Lotion  Curel Therapeutic Moisturizing Cream, Fragrance-Free  Curel Therapeutic Moisturizing Lotion, Fragrance-Free  Curel Therapeutic Moisturizing Lotion, Original Formula  Eucerin Daily Replenishing Lotion  Eucerin Dry Skin Therapy Plus Alpha Hydroxy Crme  Eucerin Dry Skin Therapy Plus Alpha Hydroxy Lotion  Eucerin Original Crme  Eucerin Original Lotion  Eucerin Plus Crme Eucerin Plus Lotion  Eucerin TriLipid Replenishing Lotion  Keri Anti-Bacterial Hand Lotion  Keri Deep Conditioning Original Lotion Dry Skin Formula Softly Scented  Keri Deep Conditioning Original Lotion, Fragrance Free Sensitive Skin Formula  Keri Lotion  Fast Absorbing Fragrance Free Sensitive Skin Formula  Keri Lotion Fast Absorbing Softly Scented Dry Skin Formula  Keri Original Lotion  Keri Skin Renewal Lotion Keri Silky Smooth Lotion  Keri Silky Smooth Sensitive Skin Lotion  Nivea Body Creamy Conditioning Oil  Nivea Body Extra Enriched Lotion  Nivea Body Original Lotion  Nivea Body Sheer Moisturizing Lotion Nivea Crme  Nivea Skin Firming Lotion  NutraDerm 30 Skin Lotion  NutraDerm Skin Lotion  NutraDerm Therapeutic Skin Cream  NutraDerm Therapeutic Skin Lotion  ProShield Protective Hand Cream  Provon moisturizing lotion  Please read over the following fact sheets that you were given.

## 2024-10-08 ENCOUNTER — Encounter (HOSPITAL_COMMUNITY): Payer: Self-pay

## 2024-10-08 ENCOUNTER — Encounter (HOSPITAL_COMMUNITY)
Admission: RE | Admit: 2024-10-08 | Discharge: 2024-10-08 | Disposition: A | Discharge status: Home / Self Care | Source: Ambulatory Visit | Attending: Neurological Surgery | Admitting: Neurological Surgery

## 2024-10-08 ENCOUNTER — Other Ambulatory Visit: Payer: Self-pay

## 2024-10-08 VITALS — BP 156/61 | HR 80 | Temp 98.4°F | Resp 18 | Ht 68.0 in | Wt 186.1 lb

## 2024-10-08 DIAGNOSIS — Z87891 Personal history of nicotine dependence: Secondary | ICD-10-CM | POA: Diagnosis not present

## 2024-10-08 DIAGNOSIS — Z8673 Personal history of transient ischemic attack (TIA), and cerebral infarction without residual deficits: Secondary | ICD-10-CM | POA: Insufficient documentation

## 2024-10-08 DIAGNOSIS — F0283 Dementia in other diseases classified elsewhere, unspecified severity, with mood disturbance: Secondary | ICD-10-CM | POA: Insufficient documentation

## 2024-10-08 DIAGNOSIS — N189 Chronic kidney disease, unspecified: Secondary | ICD-10-CM | POA: Diagnosis not present

## 2024-10-08 DIAGNOSIS — I1310 Hypertensive heart and chronic kidney disease without heart failure, with stage 1 through stage 4 chronic kidney disease, or unspecified chronic kidney disease: Secondary | ICD-10-CM | POA: Insufficient documentation

## 2024-10-08 DIAGNOSIS — Z85828 Personal history of other malignant neoplasm of skin: Secondary | ICD-10-CM | POA: Diagnosis not present

## 2024-10-08 DIAGNOSIS — Z01818 Encounter for other preprocedural examination: Secondary | ICD-10-CM | POA: Insufficient documentation

## 2024-10-08 DIAGNOSIS — F0284 Dementia in other diseases classified elsewhere, unspecified severity, with anxiety: Secondary | ICD-10-CM | POA: Diagnosis not present

## 2024-10-08 DIAGNOSIS — G3183 Dementia with Lewy bodies: Secondary | ICD-10-CM | POA: Insufficient documentation

## 2024-10-08 DIAGNOSIS — E785 Hyperlipidemia, unspecified: Secondary | ICD-10-CM | POA: Diagnosis not present

## 2024-10-08 DIAGNOSIS — M5412 Radiculopathy, cervical region: Secondary | ICD-10-CM | POA: Insufficient documentation

## 2024-10-08 DIAGNOSIS — E039 Hypothyroidism, unspecified: Secondary | ICD-10-CM | POA: Insufficient documentation

## 2024-10-08 HISTORY — DX: Cerebral infarction, unspecified: I63.9

## 2024-10-08 HISTORY — DX: Gastro-esophageal reflux disease without esophagitis: K21.9

## 2024-10-08 HISTORY — DX: Essential (primary) hypertension: I10

## 2024-10-08 HISTORY — DX: Hyperlipidemia, unspecified: E78.5

## 2024-10-08 LAB — COMPREHENSIVE METABOLIC PANEL WITH GFR
ALT: 10 U/L (ref 0–44)
AST: 28 U/L (ref 15–41)
Albumin: 4.6 g/dL (ref 3.5–5.0)
Alkaline Phosphatase: 80 U/L (ref 38–126)
Anion gap: 9 (ref 5–15)
BUN: 25 mg/dL — ABNORMAL HIGH (ref 8–23)
CO2: 28 mmol/L (ref 22–32)
Calcium: 9.6 mg/dL (ref 8.9–10.3)
Chloride: 103 mmol/L (ref 98–111)
Creatinine, Ser: 1.36 mg/dL — ABNORMAL HIGH (ref 0.61–1.24)
GFR, Estimated: 55 mL/min — ABNORMAL LOW
Glucose, Bld: 106 mg/dL — ABNORMAL HIGH (ref 70–99)
Potassium: 4.4 mmol/L (ref 3.5–5.1)
Sodium: 140 mmol/L (ref 135–145)
Total Bilirubin: 0.3 mg/dL (ref 0.0–1.2)
Total Protein: 6.8 g/dL (ref 6.5–8.1)

## 2024-10-08 LAB — CBC
HCT: 41.1 % (ref 39.0–52.0)
Hemoglobin: 14.1 g/dL (ref 13.0–17.0)
MCH: 31.2 pg (ref 26.0–34.0)
MCHC: 34.3 g/dL (ref 30.0–36.0)
MCV: 90.9 fL (ref 80.0–100.0)
Platelets: 191 K/uL (ref 150–400)
RBC: 4.52 MIL/uL (ref 4.22–5.81)
RDW: 12.4 % (ref 11.5–15.5)
WBC: 7.5 K/uL (ref 4.0–10.5)
nRBC: 0 % (ref 0.0–0.2)

## 2024-10-08 LAB — SURGICAL PCR SCREEN
MRSA, PCR: NEGATIVE
Staphylococcus aureus: NEGATIVE

## 2024-10-08 NOTE — Progress Notes (Addendum)
 PCP - Eleanor Pouch Cardiologist - denies  PPM/ICD - denies Device Orders -  n/a Rep Notified - n/a  Chest x-ray - denies EKG - 10/08/24 Stress Test - denies ECHO - 12/22/07 Cardiac Cath - denies  Sleep Study - OSA+ and wears CPAP   No DM  Last dose of GLP1 agonist-  n/a GLP1 instructions:  n/a  Blood Thinner Instructions: n/a Aspirin  Instructions: n/a  ERAS Protcol - NPO PRE-SURGERY Ensure or G2- n/a  COVID TEST- n/a   Anesthesia review: yes - ckd, OSA, HTN, CVA  Patient denies shortness of breath, fever, cough and chest pain at PAT appointment  Patient alert and oriented at PAT appointment   All instructions explained to the patient, with a verbal understanding of the material. Patient agrees to go over the instructions while at home for a better understanding. Patient also instructed to self quarantine after being tested for COVID-19. The opportunity to ask questions was provided.

## 2024-10-09 NOTE — Progress Notes (Signed)
 Anesthesia Chart Review:  Case: [id number] Date/Time: 10/15/24 0940   Procedure: POSTERIOR CERVICAL LAMINECTOMY (Left) - Laminectomy and Foraminotomy - C7-T1 - left   Anesthesia type: General   Diagnosis: Radiculopathy, cervical region [M54.12]   Pre-op diagnosis: Radiculopathy cervical region   [facility] ROOM [facility] / [facility]   Surgeons: Joshua Alm Hamilton, MD       DISCUSSION: Patient is a 73 year old male scheduled for the above procedure.  History includes former smoker (quit 12/27/1975), Lewy body dementia, HTN (and orthostatic hypotension), HLD, tachycardia, CVA/TIA, OSA (uses CPAP), hypothyroidism, dyspnea, CKD, GERD, skin cancer excisions, open cholecystectomy (07/09/2006 with post-operative ile leak s/p percutaneous drainage & biliary stent, s/p stent and stone removal 09/22/2006), chronic back pain, spinal surgery (lumbar 1996, cervical 1985), appendectomy.   Preoperative EKG and labs noted. He denied, SOB, cough, chest pain at PAT RN visit. Recent PCP routine follow-up in November who is aware of surgery plans. 09/04/2024 neurosurgery note indicate he recently had left ulnar nerve release, but C7-T1 foraminotomy was postponed due to failure of the [place] head pins so rescheduled for future date at [facility].   Anesthesia team to evaluate on the day of surgery.    VS: BP (!) 156/61   Pulse 80   Temp 36.9 C   Resp 18   Ht 5' 8 (1.727 m)   Wt 84.4 kg   SpO2 99%   BMI 28.30 kg/m    PROVIDERS: Godfrey Area, MD is PCP at Clinic, [facility]. Visit for HTN follow-up on 08/15/2024 with labs. She noted upcoming c-spine surgery and left elbow surgery coming up at [facility] and was already off ASA.  Tobie Comp, MD is ENT (evaluated for hearing loss 09/03/2024)   LABS: Labs reviewed: Acceptable for surgery. (all labs ordered are listed, but only abnormal results are displayed)  Labs Reviewed  COMPREHENSIVE METABOLIC PANEL WITH GFR - Abnormal;  Notable for the following components:      Result Value   Glucose, Bld 106 (*)    BUN 25 (*)    Creatinine, Ser 1.36 (*)    GFR, Estimated 55 (*)    All other components within normal limits  SURGICAL PCR SCREEN  CBC   Additional labs through [facility] (see CE) include TSH 4.711, A1c 5.5%, eGFR 58, Cr 1.30 08/15/2024   IMAGES: CT Temporal bones 09/21/2024: IMPRESSION: 1. No acute findings.  MRI Brain 12/25/2021: IMPRESSION: 1. No acute intracranial abnormality. 2. 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    EKG: 10/08/2024: NSR. Low voltage in aVF.   CV: Echo 08/01/2009: Study Conclusions    - Left ventricle: The cavity size was normal. Wall thickness was     normal. Systolic function was normal. The estimated ejection     fraction was in the range of 60% to 65%. Doppler parameters are     consistent with abnormal left ventricular relaxation (grade 1     diastolic dysfunction).   - Aortic valve: Trivial regurgitation.   - Mitral valve: Mild regurgitation.   - Left atrium: The atrium was mildly dilated.    Normal nuclear stress test 12/26/2007.   Past Medical History:  Diagnosis Date   Anxiety    Arthritis    Back pain    chronic   Chronic kidney disease    stage III  renal disease    Degenerative disc disease    Depression    Dizziness    Dupuytren's contracture  of right hand    Dyspnea    Elevated LFTs    Erectile dysfunction    Fall at home 08/12/2020   GERD (gastroesophageal reflux disease)    HLD (hyperlipidemia)    Hypertension    Hypothyroidism    Jaundice with stoppage of bile flow    after stent romoved   Lewy body dementia (HCC)    Memory deficits 04/23/2013   Orthostatic hypotension    Osgood-Schlatter's disease    Restless leg syndrome 04/23/2013   Skin cancer    basal cell on back x2   Sleep apnea with use of continuous positive airway pressure (CPAP)    Stroke (HCC)    pt states mini strokes - no  deficits   Tachycardia    heart monitor last week for 48 hours no report yet    Past Surgical History:  Procedure Laterality Date   APPENDECTOMY     BASAL CELL CARCINOMA EXCISION  2008,2009   CARPAL TUNNEL RELEASE  2003   CERVICAL FUSION  1985   CHOLECYSTECTOMY     GALLBLADDER SURGERY  2007-2008   LIVER BIOPSY  12/27/2011   Procedure: LIVER BIOPSY;  Surgeon: Lamar JONETTA Aho, MD;  Location: [facility];  Service: Endoscopy;  Laterality: N/A;  pt moved from 3/22 to 3/18 ( aw)   LUMBAR FUSION  1996   TONSILLECTOMY      MEDICATIONS:  acetaminophen  (TYLENOL ) 500 MG tablet   ALPRAZolam  (XANAX ) 1 MG tablet   aspirin  81 MG chewable tablet   atorvastatin  (LIPITOR ) 10 MG tablet   busPIRone  (BUSPAR ) 10 MG tablet   carbidopa -levodopa  (SINEMET  IR) 25-100 MG tablet   Carboxymethylcellulose Sod PF 1 % GEL   cetirizine  (ZYRTEC ) 10 MG tablet   colestipol  (COLESTID ) 1 g tablet   Cyanocobalamin  (B-12) 5000 MCG CAPS   diphenoxylate -atropine  (LOMOTIL ) 2.5-0.025 MG tablet   DULoxetine  (CYMBALTA ) 30 MG capsule   escitalopram  (LEXAPRO ) 10 MG tablet   fluticasone  (FLONASE ) 50 MCG/ACT nasal spray   gabapentin (NEURONTIN) 100 MG capsule   hydrochlorothiazide  (HYDRODIURIL ) 25 MG tablet   HYDROcodone -acetaminophen  (NORCO/VICODIN) 5-325 MG tablet   levothyroxine  (SYNTHROID ) 137 MCG tablet   lidocaine  (LIDODERM ) 5 %   losartan (COZAAR) 50 MG tablet   methocarbamol  (ROBAXIN ) 500 MG tablet   mirtazapine (REMERON) 30 MG tablet   Multiple Vitamin (MULTIVITAMIN WITH MINERALS) TABS tablet   potassium chloride  20 MEQ/15ML (10%) SOLN   pregabalin  (LYRICA ) 200 MG capsule   QUEtiapine  (SEROQUEL ) 50 MG tablet   terazosin (HYTRIN) 5 MG capsule   No current facility-administered medications for this encounter.   Isaiah Ruder, PA-C Surgical Short Stay/Anesthesiology [facility] Phone [phone] [facility] Phone [phone] 10/09/2024 12:43 PM

## 2024-10-09 NOTE — Anesthesia Preprocedure Evaluation (Addendum)
 "                                  Anesthesia Evaluation  Patient identified by MRN, date of birth, ID band Patient awake    Reviewed: Allergy & Precautions, NPO status , Patient's Chart, lab work & pertinent test results  Airway Mallampati: II  TM Distance: >3 FB Neck ROM: Limited    Dental  (+) Dental Advisory Given   Pulmonary shortness of breath, with exertion and lying, sleep apnea and Continuous Positive Airway Pressure Ventilation , former smoker   Pulmonary exam normal breath sounds clear to auscultation       Cardiovascular hypertension, Pt. on medications Normal cardiovascular exam Rhythm:Regular Rate:Normal     Neuro/Psych  PSYCHIATRIC DISORDERS Anxiety Depression   Dementia Memory deficits Lewey body dementiaCVA    GI/Hepatic Neg liver ROS,GERD  Medicated,,  Endo/Other  Hypothyroidism  HLD  Renal/GU Renal InsufficiencyRenal diseaseLab Results      Component                Value               Date                      NA                       140                 10/08/2024                CL                       103                 10/08/2024                K                        4.4                 10/08/2024                CO2                      28                  10/08/2024                BUN                      25 (H)              10/08/2024                CREATININE               1.36 (H)            10/08/2024                GFRNONAA                 55 (L)              10/08/2024  CALCIUM                   9.6                 10/08/2024                PHOS                     3.8                 09/11/2015                ALBUMIN                  4.6                 10/08/2024                GLUCOSE                  106 (H)             10/08/2024             negative genitourinary   Musculoskeletal  (+) Arthritis , Osteoarthritis,  Osgood-Schlatter disease Cervical radiculopathy   Abdominal   Peds   Hematology negative hematology ROS (+)   Anesthesia Other Findings   Reproductive/Obstetrics                              Anesthesia Physical Anesthesia Plan  ASA: 3  Anesthesia Plan: General   Post-op Pain Management: Dilaudid  IV, Precedex and Ofirmev  IV (intra-op)*   Induction: Intravenous  PONV Risk Score and Plan: 3 and Treatment may vary due to age or medical condition, Ondansetron  and Dexamethasone   Airway Management Planned: Oral ETT and Video Laryngoscope Planned  Additional Equipment: None  Intra-op Plan:   Post-operative Plan: Extubation in OR  Informed Consent: I have reviewed the patients History and Physical, chart, labs and discussed the procedure including the risks, benefits and alternatives for the proposed anesthesia with the patient or authorized representative who has indicated his/her understanding and acceptance.     Dental advisory given  Plan Discussed with: CRNA and Anesthesiologist  Anesthesia Plan Comments: (PAT note written 10/09/2024 by Allison Zelenak, PA-C.  )         Anesthesia Quick Evaluation  "

## 2024-10-15 ENCOUNTER — Ambulatory Visit (HOSPITAL_COMMUNITY): Admitting: Anesthesiology

## 2024-10-15 ENCOUNTER — Encounter (HOSPITAL_COMMUNITY): Payer: Self-pay | Admitting: Neurological Surgery

## 2024-10-15 ENCOUNTER — Ambulatory Visit (HOSPITAL_COMMUNITY)
Admission: RE | Admit: 2024-10-15 | Discharge: 2024-10-15 | Disposition: A | Discharge status: Home / Self Care | Attending: Neurological Surgery | Admitting: Neurological Surgery

## 2024-10-15 ENCOUNTER — Ambulatory Visit (HOSPITAL_COMMUNITY)
Admission: RE | Disposition: A | Discharge status: Home / Self Care | Payer: Self-pay | Source: Home / Self Care | Attending: Neurological Surgery

## 2024-10-15 ENCOUNTER — Ambulatory Visit (HOSPITAL_COMMUNITY): Admitting: Vascular Surgery

## 2024-10-15 ENCOUNTER — Ambulatory Visit (HOSPITAL_COMMUNITY)

## 2024-10-15 DIAGNOSIS — I10 Essential (primary) hypertension: Secondary | ICD-10-CM

## 2024-10-15 DIAGNOSIS — F32A Depression, unspecified: Secondary | ICD-10-CM | POA: Diagnosis not present

## 2024-10-15 DIAGNOSIS — I129 Hypertensive chronic kidney disease with stage 1 through stage 4 chronic kidney disease, or unspecified chronic kidney disease: Secondary | ICD-10-CM | POA: Insufficient documentation

## 2024-10-15 DIAGNOSIS — M4802 Spinal stenosis, cervical region: Secondary | ICD-10-CM | POA: Diagnosis not present

## 2024-10-15 DIAGNOSIS — M92529 Juvenile osteochondrosis of tibia tubercle, unspecified leg: Secondary | ICD-10-CM | POA: Diagnosis not present

## 2024-10-15 DIAGNOSIS — M5412 Radiculopathy, cervical region: Secondary | ICD-10-CM

## 2024-10-15 DIAGNOSIS — Z79899 Other long term (current) drug therapy: Secondary | ICD-10-CM | POA: Insufficient documentation

## 2024-10-15 DIAGNOSIS — Z87891 Personal history of nicotine dependence: Secondary | ICD-10-CM

## 2024-10-15 DIAGNOSIS — Z79890 Hormone replacement therapy (postmenopausal): Secondary | ICD-10-CM | POA: Diagnosis not present

## 2024-10-15 DIAGNOSIS — M4803 Spinal stenosis, cervicothoracic region: Secondary | ICD-10-CM | POA: Diagnosis not present

## 2024-10-15 DIAGNOSIS — N189 Chronic kidney disease, unspecified: Secondary | ICD-10-CM | POA: Diagnosis not present

## 2024-10-15 DIAGNOSIS — G4730 Sleep apnea, unspecified: Secondary | ICD-10-CM | POA: Diagnosis not present

## 2024-10-15 DIAGNOSIS — E039 Hypothyroidism, unspecified: Secondary | ICD-10-CM | POA: Diagnosis not present

## 2024-10-15 DIAGNOSIS — E785 Hyperlipidemia, unspecified: Secondary | ICD-10-CM | POA: Insufficient documentation

## 2024-10-15 DIAGNOSIS — F418 Other specified anxiety disorders: Secondary | ICD-10-CM | POA: Diagnosis not present

## 2024-10-15 DIAGNOSIS — F419 Anxiety disorder, unspecified: Secondary | ICD-10-CM | POA: Diagnosis not present

## 2024-10-15 HISTORY — PX: POSTERIOR CERVICAL LAMINECTOMY: SHX2248

## 2024-10-15 SURGERY — POSTERIOR CERVICAL LAMINECTOMY
Anesthesia: General | Laterality: Left

## 2024-10-15 MED ORDER — FENTANYL CITRATE (PF) 250 MCG/5ML IJ SOLN
INTRAMUSCULAR | Status: AC
  Filled 2024-10-15: qty 5

## 2024-10-15 MED ORDER — CHLORHEXIDINE GLUCONATE CLOTH 2 % EX PADS
6.0000 | MEDICATED_PAD | Freq: Once | CUTANEOUS | Status: AC
  Administered 2024-10-15: 6 via TOPICAL

## 2024-10-15 MED ORDER — ROCURONIUM BROMIDE 10 MG/ML (PF) SYRINGE
PREFILLED_SYRINGE | INTRAVENOUS | Status: DC | PRN
  Administered 2024-10-15: 70 mg via INTRAVENOUS

## 2024-10-15 MED ORDER — BUPIVACAINE HCL (PF) 0.25 % IJ SOLN
INTRAMUSCULAR | Status: DC | PRN
  Administered 2024-10-15: 10 mL
  Administered 2024-10-15: 5 mL

## 2024-10-15 MED ORDER — PROPOFOL 10 MG/ML IV BOLUS
INTRAVENOUS | Status: DC | PRN
  Administered 2024-10-15: 120 mg via INTRAVENOUS

## 2024-10-15 MED ORDER — KETOROLAC TROMETHAMINE 30 MG/ML IJ SOLN
INTRAMUSCULAR | Status: DC | PRN
  Administered 2024-10-15: 15 mg via INTRAVENOUS

## 2024-10-15 MED ORDER — MIDAZOLAM HCL 2 MG/2ML IJ SOLN
INTRAMUSCULAR | Status: AC
  Filled 2024-10-15: qty 2

## 2024-10-15 MED ORDER — GABAPENTIN 300 MG PO CAPS
300.0000 mg | ORAL_CAPSULE | ORAL | Status: AC
  Administered 2024-10-15: 300 mg via ORAL
  Filled 2024-10-15: qty 1

## 2024-10-15 MED ORDER — SUGAMMADEX SODIUM 200 MG/2ML IV SOLN
INTRAVENOUS | Status: DC | PRN
  Administered 2024-10-15: 200 mg via INTRAVENOUS

## 2024-10-15 MED ORDER — THROMBIN (RECOMBINANT) 5000 UNITS EX SOLR
CUTANEOUS | Status: DC | PRN
  Administered 2024-10-15: 10 mL via TOPICAL

## 2024-10-15 MED ORDER — EPHEDRINE 5 MG/ML INJ
INTRAVENOUS | Status: AC
  Filled 2024-10-15: qty 5

## 2024-10-15 MED ORDER — FENTANYL CITRATE (PF) 250 MCG/5ML IJ SOLN
INTRAMUSCULAR | Status: DC | PRN
  Administered 2024-10-15: 50 ug via INTRAVENOUS
  Administered 2024-10-15: 100 ug via INTRAVENOUS
  Administered 2024-10-15 (×2): 50 ug via INTRAVENOUS

## 2024-10-15 MED ORDER — BACITRACIN ZINC 500 UNIT/GM EX OINT
TOPICAL_OINTMENT | CUTANEOUS | Status: AC
  Filled 2024-10-15: qty 28.35

## 2024-10-15 MED ORDER — 0.9 % SODIUM CHLORIDE (POUR BTL) OPTIME
TOPICAL | Status: DC | PRN
  Administered 2024-10-15: 1000 mL

## 2024-10-15 MED ORDER — THROMBIN 5000 UNITS EX SOLR
OROMUCOSAL | Status: DC | PRN
  Administered 2024-10-15: 5 mL via TOPICAL

## 2024-10-15 MED ORDER — PROPOFOL 10 MG/ML IV BOLUS
INTRAVENOUS | Status: AC
  Filled 2024-10-15: qty 20

## 2024-10-15 MED ORDER — CHLORHEXIDINE GLUCONATE 0.12 % MT SOLN
15.0000 mL | Freq: Once | OROMUCOSAL | Status: AC
  Administered 2024-10-15: 15 mL via OROMUCOSAL
  Filled 2024-10-15: qty 15

## 2024-10-15 MED ORDER — BUPIVACAINE HCL (PF) 0.25 % IJ SOLN
INTRAMUSCULAR | Status: AC
  Filled 2024-10-15: qty 30

## 2024-10-15 MED ORDER — THROMBIN 5000 UNITS EX KIT
PACK | CUTANEOUS | Status: AC
  Filled 2024-10-15: qty 3

## 2024-10-15 MED ORDER — PHENYLEPHRINE 80 MCG/ML (10ML) SYRINGE FOR IV PUSH (FOR BLOOD PRESSURE SUPPORT)
PREFILLED_SYRINGE | INTRAVENOUS | Status: DC | PRN
  Administered 2024-10-15: 160 ug via INTRAVENOUS
  Administered 2024-10-15: 80 ug via INTRAVENOUS

## 2024-10-15 MED ORDER — MIDAZOLAM HCL (PF) 2 MG/2ML IJ SOLN
INTRAMUSCULAR | Status: DC | PRN
  Administered 2024-10-15: 2 mg via INTRAVENOUS

## 2024-10-15 MED ORDER — LIDOCAINE 2% (20 MG/ML) 5 ML SYRINGE
INTRAMUSCULAR | Status: DC | PRN
  Administered 2024-10-15: 60 mg via INTRAVENOUS

## 2024-10-15 MED ORDER — ONDANSETRON HCL 4 MG/2ML IJ SOLN
INTRAMUSCULAR | Status: DC | PRN
  Administered 2024-10-15: 4 mg via INTRAVENOUS

## 2024-10-15 MED ORDER — ACETAMINOPHEN 500 MG PO TABS
1000.0000 mg | ORAL_TABLET | ORAL | Status: AC
  Administered 2024-10-15: 1000 mg via ORAL
  Filled 2024-10-15: qty 2

## 2024-10-15 MED ORDER — CHLORHEXIDINE GLUCONATE CLOTH 2 % EX PADS: 6.0000 | MEDICATED_PAD | Freq: Once | CUTANEOUS | Status: DC

## 2024-10-15 MED ORDER — PHENYLEPHRINE HCL-NACL 20-0.9 MG/250ML-% IV SOLN
INTRAVENOUS | Status: DC | PRN
  Administered 2024-10-15: 10 ug/min via INTRAVENOUS

## 2024-10-15 MED ORDER — LACTATED RINGERS IV SOLN: INTRAVENOUS | Status: DC

## 2024-10-15 MED ORDER — CEFAZOLIN SODIUM-DEXTROSE 2-4 GM/100ML-% IV SOLN
2.0000 g | INTRAVENOUS | Status: AC
  Administered 2024-10-15: 2 g via INTRAVENOUS
  Filled 2024-10-15: qty 100

## 2024-10-15 MED ORDER — DEXAMETHASONE SOD PHOSPHATE PF 10 MG/ML IJ SOLN
INTRAMUSCULAR | Status: DC | PRN
  Administered 2024-10-15: 10 mg via INTRAVENOUS

## 2024-10-15 MED ORDER — ORAL CARE MOUTH RINSE: 15.0000 mL | Freq: Once | OROMUCOSAL | Status: AC

## 2024-10-15 SURGICAL SUPPLY — 41 items
BAG COUNTER SPONGE SURGICOUNT (BAG) ×1 IMPLANT
BAND RUBBER 3X1/6 STRL (MISCELLANEOUS) ×2 IMPLANT
BENZOIN TINCTURE PRP APPL 2/3 (GAUZE/BANDAGES/DRESSINGS) ×2 IMPLANT
BLADE CLIPPER SURG (BLADE) IMPLANT
BUR CARBIDE MATCH 3.0 (BURR) IMPLANT
CANISTER SUCTION 3000ML PPV (SUCTIONS) ×1 IMPLANT
CLSR STERI-STRIP ANTIMIC 1/2X4 (GAUZE/BANDAGES/DRESSINGS) IMPLANT
DRAPE LAPAROTOMY 100X72 PEDS (DRAPES) ×1 IMPLANT
DRAPE MICROSCOPE LEICA (MISCELLANEOUS) IMPLANT
DRSG AQUACEL AG ADV 3.5X 6 (GAUZE/BANDAGES/DRESSINGS) ×1 IMPLANT
DRSG OPSITE POSTOP 4X6 (GAUZE/BANDAGES/DRESSINGS) IMPLANT
DURAPREP 26ML APPLICATOR (WOUND CARE) ×1 IMPLANT
ELECTRODE REM PT RTRN 9FT ADLT (ELECTROSURGICAL) ×1 IMPLANT
GAUZE 4X4 16PLY ~~LOC~~+RFID DBL (SPONGE) IMPLANT
GAUZE SPONGE 4X4 12PLY STRL (GAUZE/BANDAGES/DRESSINGS) IMPLANT
GLOVE BIO SURGEON STRL SZ7 (GLOVE) IMPLANT
GLOVE BIO SURGEON STRL SZ8 (GLOVE) ×1 IMPLANT
GLOVE BIOGEL PI IND STRL 7.0 (GLOVE) IMPLANT
GOWN STRL REUS W/ TWL LRG LVL3 (GOWN DISPOSABLE) IMPLANT
GOWN STRL REUS W/ TWL XL LVL3 (GOWN DISPOSABLE) ×1 IMPLANT
GOWN STRL REUS W/TWL 2XL LVL3 (GOWN DISPOSABLE) IMPLANT
HEMOSTAT POWDER KIT SURGIFOAM (HEMOSTASIS) IMPLANT
KIT BASIN OR (CUSTOM PROCEDURE TRAY) ×1 IMPLANT
KIT TURNOVER KIT B (KITS) ×1 IMPLANT
NEEDLE 18GX1X1/2 (RX/OR ONLY) (NEEDLE) IMPLANT
NEEDLE HYPO 22X1.5 SAFETY MO (MISCELLANEOUS) ×1 IMPLANT
NEEDLE SPNL 20GX3.5 QUINCKE YW (NEEDLE) ×1 IMPLANT
PACK LAMINECTOMY NEURO (CUSTOM PROCEDURE TRAY) ×1 IMPLANT
PAD ARMBOARD POSITIONER FOAM (MISCELLANEOUS) ×1 IMPLANT
PIN MAYFIELD SKULL DISP (PIN) ×1 IMPLANT
SOLN 0.9% NACL POUR BTL 1000ML (IV SOLUTION) ×1 IMPLANT
SOLN STERILE WATER BTL 1000 ML (IV SOLUTION) ×1 IMPLANT
SPONGE SURGIFOAM ABS GEL SZ50 (HEMOSTASIS) IMPLANT
SPONGE T-LAP 4X18 ~~LOC~~+RFID (SPONGE) IMPLANT
STRIP CLOSURE SKIN 1/2X4 (GAUZE/BANDAGES/DRESSINGS) ×1 IMPLANT
SUT VIC AB 0 CT1 18XCR BRD8 (SUTURE) ×1 IMPLANT
SUT VIC AB 2-0 CP2 18 (SUTURE) ×1 IMPLANT
SUT VIC AB 3-0 SH 8-18 (SUTURE) ×1 IMPLANT
SYR 5ML LL (SYRINGE) IMPLANT
TOWEL GREEN STERILE (TOWEL DISPOSABLE) ×1 IMPLANT
TOWEL GREEN STERILE FF (TOWEL DISPOSABLE) ×1 IMPLANT

## 2024-10-15 NOTE — Transfer of Care (Signed)
 Immediate Anesthesia Transfer of Care Note  Patient: Hayden Rose  Procedure(s) Performed: POSTERIOR CERVICAL LAMINECTOMY CERVICAL SEVEN-THORACIC ONE LEFT (Left)  Patient Location: PACU  Anesthesia Type:General  Level of Consciousness: awake, alert , and oriented  Airway & Oxygen Therapy: Patient Spontanous Breathing and Patient connected to face mask oxygen  Post-op Assessment: Report given to RN and Post -op Vital signs reviewed and stable  Post vital signs: Reviewed and stable  Last Vitals:  Vitals Value Taken Time  BP 174/83 10/15/24 12:19  Temp    Pulse 80 10/15/24 12:20  Resp 14 10/15/24 12:20  SpO2 97 % 10/15/24 12:20  Vitals shown include unfiled device data.  Last Pain:  Vitals:   10/15/24 0903  TempSrc:   PainSc: 6       Patients Stated Pain Goal: 4 (10/15/24 9096)  Complications: There were no known notable events for this encounter.

## 2024-10-15 NOTE — Discharge Summary (Signed)
 " Physician Discharge Summary  Patient ID: Hayden Rose MRN: [id number] DOB/AGE: Mar 01, 1951 74 y.o.  Admit date: 10/15/2024 Discharge date: 10/15/2024  Admission Diagnoses: Left C8 radiculopathy with numbness and weakness and atrophy in the left hand    Discharge Diagnoses: Same   Discharged Condition: Good  Hospital Course: The patient was admitted on 10/15/2024 and taken to the operating room where the patient underwent left C7-T1 laminectomy and foraminotomies. The patient tolerated the procedure well and was taken to the recovery room and then to the PACU in stable condition. The hospital course was routine. There were no complications. The wound remained clean dry and intact. Pt had appropriate neck soreness. No complaints of arm pain or new N/T/W. The patient remained afebrile with stable vital signs, and tolerated a regular diet. The patient continued to increase activities, and pain was well controlled with oral pain medications.   Consults: None  Significant Diagnostic Studies:  Results for orders placed or performed during the hospital encounter of 10/08/24  Surgical pcr screen   Collection Time: 10/08/24 10:33 AM   Specimen: Nasal Mucosa; Nasal Swab  Result Value Ref Range   MRSA, PCR NEGATIVE NEGATIVE   Staphylococcus aureus NEGATIVE NEGATIVE  CBC per protocol   Collection Time: 10/08/24 10:47 AM  Result Value Ref Range   WBC 7.5 4.0 - 10.5 K/uL   RBC 4.52 4.22 - 5.81 MIL/uL   Hemoglobin 14.1 13.0 - 17.0 g/dL   HCT 58.8 60.9 - 47.9 %   MCV 90.9 80.0 - 100.0 fL   MCH 31.2 26.0 - 34.0 pg   MCHC 34.3 30.0 - 36.0 g/dL   RDW 87.5 88.4 - 84.4 %   Platelets 191 150 - 400 K/uL   nRBC 0.0 0.0 - 0.2 %  Comprehensive metabolic panel per protocol   Collection Time: 10/08/24 10:47 AM  Result Value Ref Range   Sodium 140 135 - 145 mmol/L   Potassium 4.4 3.5 - 5.1 mmol/L   Chloride 103 98 - 111 mmol/L   CO2 28 22 - 32 mmol/L   Glucose, Bld 106 (H) 70 - 99 mg/dL   BUN 25 (H) 8  - 23 mg/dL   Creatinine, Ser 8.63 (H) 0.61 - 1.24 mg/dL   Calcium  9.6 8.9 - 10.3 mg/dL   Total Protein 6.8 6.5 - 8.1 g/dL   Albumin 4.6 3.5 - 5.0 g/dL   AST 28 15 - 41 U/L   ALT 10 0 - 44 U/L   Alkaline Phosphatase 80 38 - 126 U/L   Total Bilirubin 0.3 0.0 - 1.2 mg/dL   GFR, Estimated 55 (L) >60 mL/min   Anion gap 9 5 - 15    CT TEMPORAL BONES WO CONTRAST Result Date: 09/25/2024 EXAM: CT TEMPORAL BONES WITHOUT CONTRAST 09/21/2024 02:39:08 PM TECHNIQUE: CT of the temporal bones was performed without the administration of intravenous contrast. Multiplanar reformatted images are provided for review. Automated exposure control, iterative reconstruction, and/or weight based adjustment of the mA/kV was utilized to reduce the radiation dose to as low as reasonably achievable. COMPARISON: CT of the head dated 12/25/2021. CLINICAL HISTORY: bilateral mixed hearing loss FINDINGS: RIGHT TEMPORAL BONE: EXTERNAL AUDITORY CANAL: Clear. No bony erosion. Scutum is intact. MIDDLE EAR CAVITY: Clear. Ossicular chain is intact. MASTOID AIR CELLS: Clear. INNER EAR: The cochlea and vestibule are unremarkable. Normal mineralization of the otic capsule. Normal semicircular canals. The vestibular aqueduct is not dilated. INTERNAL AUDITORY CANAL: Unremarkable. Normal bony canal of the facial nerve. LEFT TEMPORAL  BONE: EXTERNAL AUDITORY CANAL: Clear. No bony erosion. Scutum is intact. MIDDLE EAR CAVITY: Clear. Ossicular chain is intact. MASTOID AIR CELLS: Clear. INNER EAR: The cochlea and vestibule are unremarkable. Normal mineralization of the otic capsule. Normal semicircular canals. The vestibular aqueduct is not dilated. INTERNAL AUDITORY CANAL: Unremarkable. Normal bony canal of the facial nerve. VASCULAR: Normal jugular bulbs. Normal carotid canals. BRAIN: Unremarkable. ORBITS: No acute abnormality. SINUSES: Clear. IMPRESSION: 1. No acute findings. Electronically signed by: Evalene Coho MD 09/25/2024 04:02 AM EST RP  Workstation: [id number]    Antibiotics:  Anti-infectives (From admission, onward)    Start     Dose/Rate Route Frequency Ordered Stop   10/15/24 0815  ceFAZolin  (ANCEF ) IVPB 2g/100 mL premix        2 g 200 mL/hr over 30 Minutes Intravenous On call to O.R. 10/15/24 0804 10/15/24 1108       Discharge Exam: Blood pressure (!) 148/86, pulse 79, temperature 97.6 F (36.4 C), temperature source Oral, resp. rate 18, height 5' 8 (1.727 m), weight 82.6 kg, SpO2 98%. Neurologic: Grossly normal Dressing dry  Discharge Medications:   Allergies as of 10/15/2024       Reactions   Solifenacin Succinate Anaphylaxis   Fentanyl  Rash, Dermatitis   patch   Sulfa Antibiotics Swelling   Donepezil Hydrochloride Nausea And Vomiting, Nausea Only   Elemental Sulfur Swelling   Lamotrigine    Petechia   Memantine Other (See Comments)   unknown   Namenda [memantine Hcl] Other (See Comments)   unknown   Oxcarbazepine Nausea And Vomiting   Sulfur Swelling   Gabapentin  Swelling   Oxybutynin Chloride    Other Reaction(s): Xerostomia   Penicillin G Rash   Penicillins Rash   Rivastigmine Rash   patch        Medication List     STOP taking these medications    acetaminophen  500 MG tablet Commonly known as: TYLENOL    ALPRAZolam  1 MG tablet Commonly known as: XANAX    aspirin  81 MG chewable tablet   cetirizine  10 MG tablet Commonly known as: ZYRTEC    escitalopram  10 MG tablet Commonly known as: LEXAPRO    hydrochlorothiazide  25 MG tablet Commonly known as: HYDRODIURIL    HYDROcodone -acetaminophen  5-325 MG tablet Commonly known as: NORCO/VICODIN   losartan 50 MG tablet Commonly known as: COZAAR   methocarbamol  500 MG tablet Commonly known as: ROBAXIN    potassium chloride  20 MEQ/15ML (10%) Soln   QUEtiapine  50 MG tablet Commonly known as: SEROQUEL        TAKE these medications    carbidopa -levodopa  25-100 MG tablet Commonly known as: SINEMET  IR Take 1 tablet by mouth 3  (three) times daily. The timing of this medication is very important.   atorvastatin  10 MG tablet Commonly known as: LIPITOR  Take 10 mg by mouth at bedtime.   B-12 5000 MCG Caps Take 1 tablet by mouth daily.   busPIRone  10 MG tablet Commonly known as: BUSPAR  Take 10 mg by mouth 2 (two) times daily.   Carboxymethylcellulose Sod PF 1 % Gel Place 1 drop into both eyes at bedtime.   colestipol  1 g tablet Commonly known as: COLESTID  Take 1 g by mouth 2 (two) times daily.   diphenoxylate -atropine  2.5-0.025 MG tablet Commonly known as: LOMOTIL  Take 1 tablet by mouth in the morning and at bedtime.   DULoxetine  30 MG capsule Commonly known as: CYMBALTA  Take 30 mg by mouth 2 (two) times daily.   fluticasone  50 MCG/ACT nasal spray Commonly known as: FLONASE  Place 2  sprays into both nostrils daily.   gabapentin  100 MG capsule Commonly known as: NEURONTIN  Take 100 mg by mouth 3 (three) times daily.   levothyroxine  137 MCG tablet Commonly known as: SYNTHROID  Take 137 mcg by mouth daily before breakfast.   lidocaine  5 % Commonly known as: LIDODERM  Place onto the skin.   mirtazapine 30 MG tablet Commonly known as: REMERON Take 30 mg by mouth at bedtime.   multivitamin with minerals Tabs tablet Take 1 tablet by mouth daily.   pregabalin  200 MG capsule Commonly known as: LYRICA  Take 200 mg by mouth in the morning, at noon, and at bedtime.   terazosin 5 MG capsule Commonly known as: HYTRIN Take 5 mg by mouth at bedtime.        Disposition: Home   Final Dx: L C7-T1 laminectomy and foraminotomies  Discharge Instructions      Remove dressing in 72 hours   Complete by: As directed    Call MD for:  difficulty breathing, headache or visual disturbances   Complete by: As directed    Call MD for:  persistant nausea and vomiting   Complete by: As directed    Call MD for:  redness, tenderness, or signs of infection (pain, swelling, redness, odor or green/yellow  discharge around incision site)   Complete by: As directed    Call MD for:  severe uncontrolled pain   Complete by: As directed    Call MD for:  temperature >100.4   Complete by: As directed    Increase activity slowly   Complete by: As directed           Signed: Alm GORMAN Molt 10/15/2024, 12:19 PM   "

## 2024-10-15 NOTE — H&P (Signed)
 Subjective:   Patient is a 74 y.o. male admitted for left C8 radiculopathy with hand weakness. The patient first presented to me with complaints of arm pain and loss of strength of the arm(s). Onset of symptoms was several months ago. The pain is described as aching and occurs all day. The pain is rated severe, and is located in the neck and radiates to the left upper extremity in a C8 distribution. The symptoms have been progressive. Symptoms are exacerbated by extending head backwards, and are relieved by none.  Previous work up includes MRI of cervical spine, results: spinal stenosis.  Past Medical History:  Diagnosis Date   Anxiety    Arthritis    Back pain    chronic   Chronic kidney disease    stage III  renal disease    Degenerative disc disease    Depression    Dizziness    Dupuytren's contracture of right hand    Dyspnea    Elevated LFTs    Erectile dysfunction    Fall at home 08/12/2020   GERD (gastroesophageal reflux disease)    HLD (hyperlipidemia)    Hypertension    Hypothyroidism    Jaundice with stoppage of bile flow    after stent romoved   Lewy body dementia (HCC)    Memory deficits 04/23/2013   Orthostatic hypotension    Osgood-Schlatter's disease    Restless leg syndrome 04/23/2013   Skin cancer    basal cell on back x2   Sleep apnea with use of continuous positive airway pressure (CPAP)    Stroke (HCC)    pt states mini strokes - no deficits   Tachycardia    heart monitor last week for 48 hours no report yet    Past Surgical History:  Procedure Laterality Date   APPENDECTOMY     BASAL CELL CARCINOMA EXCISION  2008,2009   CARPAL TUNNEL RELEASE  2003   CERVICAL FUSION  1985   CHOLECYSTECTOMY     GALLBLADDER SURGERY  2007-2008   LIVER BIOPSY  12/27/2011   Procedure: LIVER BIOPSY;  Surgeon: Lamar JONETTA Aho, MD;  Location: [facility];  Service: Endoscopy;  Laterality: N/A;  pt moved from 3/22 to 3/18 ( aw)   LUMBAR FUSION  1996   TONSILLECTOMY       Allergies[1]  Social History   Tobacco Use   Smoking status: Former    Current packs/day: 0.00    Types: Cigarettes    Quit date: 12/27/1975    Years since quitting: 48.8   Smokeless tobacco: Never  Substance Use Topics   Alcohol  use: No    Family History  Problem Relation Age of Onset   Coronary artery disease Mother    Heart attack Mother        stents in neck and heart ?   Crohn's disease Mother    Cirrhosis Father    Cirrhosis Sister    Prostate cancer Maternal Uncle    Liver cancer Cousin    Colitis Daughter    Diabetes Maternal Uncle    Diabetes Brother    Kidney disease Maternal Aunt    Kidney disease Cousin    Prior to Admission medications  Medication Sig Start Date End Date Taking? Authorizing Provider  atorvastatin  (LIPITOR ) 10 MG tablet Take 10 mg by mouth at bedtime.   Yes [provider]  busPIRone  (BUSPAR ) 10 MG tablet Take 10 mg by mouth 2 (two) times daily.   Yes [provider]  carbidopa -levodopa  (  SINEMET  IR) 25-100 MG tablet Take 1 tablet by mouth 3 (three) times daily.   Yes [provider]  Carboxymethylcellulose Sod PF 1 % GEL Place 1 drop into both eyes at bedtime.   Yes [provider]  colestipol  (COLESTID ) 1 g tablet Take 1 g by mouth 2 (two) times daily.   Yes [provider]  Cyanocobalamin  (B-12) 5000 MCG CAPS Take 1 tablet by mouth daily.   Yes [provider]  diphenoxylate -atropine  (LOMOTIL ) 2.5-0.025 MG tablet Take 1 tablet by mouth in the morning and at bedtime.   Yes [provider]  DULoxetine  (CYMBALTA ) 30 MG capsule Take 30 mg by mouth 2 (two) times daily.   Yes [provider]  fluticasone  (FLONASE ) 50 MCG/ACT nasal spray Place 2 sprays into both nostrils daily. 09/03/24  Yes Tobie Eldora NOVAK, MD  gabapentin  (NEURONTIN ) 100 MG capsule Take 100 mg by mouth 3 (three) times daily. 04/02/24  Yes [provider]  levothyroxine  (SYNTHROID ) 137 MCG tablet Take 137  mcg by mouth daily before breakfast. 05/28/24  Yes [provider]  lidocaine  (LIDODERM ) 5 % Place onto the skin. 04/11/24  Yes [provider]  mirtazapine (REMERON) 30 MG tablet Take 30 mg by mouth at bedtime. 11/02/22  Yes [provider]  Multiple Vitamin (MULTIVITAMIN WITH MINERALS) TABS tablet Take 1 tablet by mouth daily.   Yes [provider]  acetaminophen  (TYLENOL ) 500 MG tablet Take 2 tablets (1,000 mg total) by mouth every 6 (six) hours as needed. Patient not taking: Reported on 12/26/2021 08/14/20   Augustus Almarie RAMAN, PA-C  ALPRAZolam  (XANAX ) 1 MG tablet Take 1 mg by mouth 2 (two) times daily. Patient not taking: Reported on 10/01/2024    [provider]  aspirin  81 MG chewable tablet Chew 1 tablet (81 mg total) by mouth daily. Patient not taking: Reported on 10/01/2024 09/11/15   Glennon Lot, MD  cetirizine  (ZYRTEC ) 10 MG tablet Take 1 tablet (10 mg total) by mouth daily. Patient not taking: Reported on 10/01/2024 07/16/24   Soldatova, [place], MD  escitalopram  (LEXAPRO ) 10 MG tablet Take 1 tablet (10 mg total) by mouth daily. Patient not taking: Reported on 10/01/2024 12/29/21   Raenelle Donalda HERO, MD  hydrochlorothiazide  (HYDRODIURIL ) 25 MG tablet Take 25 mg by mouth daily. Patient not taking: Reported on 10/01/2024    [provider]  HYDROcodone -acetaminophen  (NORCO/VICODIN) 5-325 MG tablet Take by mouth. Patient not taking: Reported on 10/01/2024 08/07/24   [provider]  losartan (COZAAR) 50 MG tablet Take 25 mg by mouth daily. Patient not taking: Reported on 10/01/2024 05/10/24   [provider]  methocarbamol  (ROBAXIN ) 500 MG tablet Take 500 mg by mouth 3 (three) times daily as needed. Patient not taking: Reported on 10/01/2024 08/07/24   [provider]  potassium chloride  20 MEQ/15ML (10%) SOLN Take 20 mEq by mouth daily. Patient not taking: Reported on 10/01/2024    [provider]   pregabalin  (LYRICA ) 200 MG capsule Take 200 mg by mouth in the morning, at noon, and at bedtime.    [provider]  QUEtiapine  (SEROQUEL ) 50 MG tablet Take 1 tablet (50 mg total) by mouth at bedtime. Patient not taking: Reported on 10/01/2024 12/29/21   Raenelle Donalda HERO, MD  terazosin (HYTRIN) 5 MG capsule Take 5 mg by mouth at bedtime. Patient not taking: Reported on 10/01/2024    [provider]     Review of Systems  Positive ROS: neg  All other  systems have been reviewed and were otherwise negative with the exception of those mentioned in the HPI and as above.  Objective: Vital signs in last 24 hours: Temp:  [97.6 F (36.4 C)] 97.6 F (36.4 C) (01/05 0820) Pulse Rate:  [79] 79 (01/05 0820) Resp:  [18] 18 (01/05 0820) BP: (148)/(86) 148/86 (01/05 0820) SpO2:  [98 %] 98 % (01/05 0820) Weight:  [82.6 kg] 82.6 kg (01/05 0820)  General Appearance: Alert, cooperative, no distress, appears stated age Head: Normocephalic, without obvious abnormality, atraumatic Eyes: PERRL, conjunctiva/corneas clear, EOM's intact      Neck: Supple, symmetrical, trachea midline, Back: Symmetric, no curvature, ROM normal, no CVA tenderness Lungs:  respirations unlabored Heart: Regular rate and rhythm Abdomen: Soft, non-tender Extremities: Extremities normal, atraumatic, no cyanosis or edema Pulses: 2+ and symmetric all extremities Skin: Skin color, texture, turgor normal, no rashes or lesions  NEUROLOGIC:  Mental status: Alert and oriented x4, no aphasia, good attention span, fund of knowledge and memory  Motor Exam - grossly normal except for some atrophy and weakness of the left hand Sensory Exam - grossly normal Reflexes: 1+ Coordination - grossly normal Gait - grossly normal Balance - grossly normal Cranial Nerves: I: smell Not tested  II: visual acuity  OS: nl    OD: nl  II: visual fields Full to confrontation  II: pupils Equal, round, reactive to light  III,VII:  ptosis None  III,IV,VI: extraocular muscles  Full ROM  V: mastication Normal  V: facial light touch sensation  Normal  V,VII: corneal reflex  Present  VII: facial muscle function - upper  Normal  VII: facial muscle function - lower Normal  VIII: hearing Not tested  IX: soft palate elevation  Normal  IX,X: gag reflex Present  XI: trapezius strength  5/5  XI: sternocleidomastoid strength 5/5  XI: neck flexion strength  5/5  XII: tongue strength  Normal    Data Review Lab Results  Component Value Date   WBC 7.5 10/08/2024   HGB 14.1 10/08/2024   HCT 41.1 10/08/2024   MCV 90.9 10/08/2024   PLT 191 10/08/2024   Lab Results  Component Value Date   NA 140 10/08/2024   K 4.4 10/08/2024   CL 103 10/08/2024   CO2 28 10/08/2024   BUN 25 (H) 10/08/2024   CREATININE 1.36 (H) 10/08/2024   GLUCOSE 106 (H) 10/08/2024   Lab Results  Component Value Date   INR 1.1 12/25/2021    Assessment:   Cervical neck pain with herniated nucleus pulposus/ spondylosis/ stenosis at C7-T1 left. Estimated body mass index is 27.67 kg/m as calculated from the following:   Height as of this encounter: 5' 8 (1.727 m).   Weight as of this encounter: 82.6 kg.  Patient has failed conservative therapy. Planned surgery : Laminectomy and foraminotomies C7-T1 on the left  Plan:   I explained the condition and procedure to the patient and answered any questions.  Patient wishes to proceed with procedure as planned. Understands risks/ benefits/ and expected or typical outcomes.  Alm GORMAN Molt 10/15/2024 10:15 AM      [1]  Allergies Allergen Reactions   Solifenacin Succinate Anaphylaxis   Fentanyl  Rash and Dermatitis    patch   Sulfa Antibiotics Swelling   Donepezil Hydrochloride Nausea And Vomiting and Nausea Only   Elemental Sulfur Swelling   Lamotrigine     Petechia   Memantine Other (See Comments)    unknown   Namenda [Memantine Hcl] Other (See Comments)  unknown   Oxcarbazepine Nausea  And Vomiting   Sulfur Swelling   Gabapentin  Swelling   Oxybutynin Chloride     Other Reaction(s): Xerostomia   Penicillin G Rash   Penicillins Rash   Rivastigmine Rash    patch

## 2024-10-15 NOTE — Anesthesia Postprocedure Evaluation (Signed)
"   Anesthesia Post Note  Patient: Hayden Rose  Procedure(s) Performed: POSTERIOR CERVICAL LAMINECTOMY CERVICAL SEVEN-THORACIC ONE LEFT (Left)     Patient location during evaluation: PACU Anesthesia Type: General Level of consciousness: awake and alert Pain management: pain level controlled Vital Signs Assessment: post-procedure vital signs reviewed and stable Respiratory status: spontaneous breathing, nonlabored ventilation and respiratory function stable Cardiovascular status: blood pressure returned to baseline and stable Postop Assessment: no apparent nausea or vomiting Anesthetic complications: no   There were no known notable events for this encounter.  Last Vitals:  Vitals:   10/15/24 1232 10/15/24 1247  BP: (!) 166/75 (!) 165/74  Pulse: 77 65  Resp: (!) 25 (!) 8  Temp:    SpO2: 97% 97%    Last Pain:  Vitals:   10/15/24 0903  TempSrc:   PainSc: 6                  Elliemae Braman A.      "

## 2024-10-15 NOTE — Anesthesia Procedure Notes (Signed)
 Procedure Name: Intubation Date/Time: 10/15/2024 10:49 AM  Performed by: Alen Motto D, CRNAPre-anesthesia Checklist: Patient identified, Emergency Drugs available, Suction available and Patient being monitored Patient Re-evaluated:Patient Re-evaluated prior to induction Oxygen Delivery Method: Circle System Utilized Preoxygenation: Pre-oxygenation with 100% oxygen Induction Type: IV induction Ventilation: Mask ventilation without difficulty Laryngoscope Size: 4 and Glidescope Grade View: Grade I Tube type: Oral Tube size: 7.5 mm Number of attempts: 1 Airway Equipment and Method: Stylet and Oral airway Placement Confirmation: ETT inserted through vocal cords under direct vision, positive ETCO2 and breath sounds checked- equal and bilateral Secured at: 21 cm Tube secured with: Tape Dental Injury: Teeth and Oropharynx as per pre-operative assessment

## 2024-10-15 NOTE — Op Note (Signed)
 10/15/2024  12:13 PM  PATIENT:  Hayden Rose  74 y.o. male  PRE-OPERATIVE DIAGNOSIS: Left C8 radiculopathy with numbness and weakness and atrophy in the hand  POST-OPERATIVE DIAGNOSIS:  same  PROCEDURE: Left C7-T1 hemilaminectomy medial facetectomy and foraminotomies to decompress the left C8 nerve root  SURGEON:  Alm Molt, MD  ASSISTANTSBETHA Pean FNP  ANESTHESIA:   General  EBL: 25 ml  Total I/O In: 500 [I.V.:500] Out: 15 [Blood:15]  BLOOD ADMINISTERED: none  DRAINS: none  SPECIMEN:  none  INDICATION FOR PROCEDURE: This patient presented with a left C8 radiculopathy with numbness and atrophy in the hand with pain in the arm. Imaging showed severe foraminal stenosis C7-T1 on the left. The patient tried conservative measures without relief. Pain was debilitating. Recommended left 7 T1 foraminotomies. Patient understood the risks, benefits, and alternatives and potential outcomes and wished to proceed.  PROCEDURE DETAILS: The patient was brought to the operating room. Generalized endotracheal anesthesia was induced. The patient was affixed a 3 point [place] headrest and rolled into the prone position on chest rolls. All pressure points were padded. The posterior cervical region was cleaned and prepped with DuraPrep and then draped in the usual sterile fashion. 7 cc of local anesthesia was injected and a dorsal midline incision made in the posterior cervical region and carried down to the cervical fascia. The fascia was opened and the paraspinous musculature was taken down to expose C7-T1 on the left. Intraoperative fluoroscopy confirmed my level and then the dissection was carried out over the lateral facets.  I performed a keyhole foraminotomies at C7-T1 on the left.  I drilled the superior part of T1 and inferior part of the lamina of C7 and drilled out laterally over the C8 nerve root.  I open the yellow ligament to expose the underlying dura and exiting C8 nerve.  I skeletonized  the pedicle.  We undercut the C7 lamina and the foramen to further decompress the nerve distally into the foramen.  A nerve hook then passed easily along the nerve and the nerve appeared to be free.  I irrigated with saline solution . I  lined the dura with Gelfoam. After hemostasis was achieved I closed the muscle and the fascia with 0 Vicryl, subcutaneous tissue with 2-0 Vicryl, and the subcuticular tissue with 3-0 Vicryl. The skin was closed with benzoin and Steri-Strips. A sterile dressing was applied, the patient was turned to the supine position and taken out of the headrest, awakened from general anesthesia and transferred to the recovery room in stable condition. At the end of the procedure all sponge, needle and instrument counts were correct.    PLAN OF CARE: Discharge to home after PACU  PATIENT DISPOSITION:  PACU - hemodynamically stable.   Delay start of Pharmacological VTE agent (>24hrs) due to surgical blood loss or risk of bleeding:  yes

## 2024-10-16 ENCOUNTER — Encounter (HOSPITAL_COMMUNITY): Payer: Self-pay | Admitting: Neurological Surgery

## 2024-10-16 MED FILL — Thrombin For Soln 5000 Unit: CUTANEOUS | Qty: 2 | Status: AC

## 2024-10-29 ENCOUNTER — Ambulatory Visit (INDEPENDENT_AMBULATORY_CARE_PROVIDER_SITE_OTHER): Admitting: Otolaryngology

## 2024-10-29 DIAGNOSIS — H906 Mixed conductive and sensorineural hearing loss, bilateral: Secondary | ICD-10-CM

## 2024-11-01 ENCOUNTER — Telehealth (INDEPENDENT_AMBULATORY_CARE_PROVIDER_SITE_OTHER): Payer: Self-pay | Admitting: Otolaryngology

## 2024-11-01 ENCOUNTER — Ambulatory Visit (INDEPENDENT_AMBULATORY_CARE_PROVIDER_SITE_OTHER): Admitting: Otolaryngology

## 2024-11-01 NOTE — Progress Notes (Signed)
 Was not able to reach patient Hayden Rose Tish Begin [name]

## 2024-11-01 NOTE — Telephone Encounter (Signed)
 error

## 2024-11-15 ENCOUNTER — Ambulatory Visit (INDEPENDENT_AMBULATORY_CARE_PROVIDER_SITE_OTHER): Admitting: Otolaryngology

## 2024-11-15 ENCOUNTER — Encounter (INDEPENDENT_AMBULATORY_CARE_PROVIDER_SITE_OTHER): Payer: Self-pay | Admitting: Otolaryngology

## 2024-11-15 VITALS — BP 185/77 | HR 85 | Ht 68.0 in | Wt 182.0 lb

## 2024-11-15 DIAGNOSIS — H906 Mixed conductive and sensorineural hearing loss, bilateral: Secondary | ICD-10-CM

## 2024-11-15 DIAGNOSIS — H9313 Tinnitus, bilateral: Secondary | ICD-10-CM

## 2024-11-15 NOTE — Progress Notes (Signed)
 Otolaryngology Clinic Note HPI:  Hayden Rose is a 74 y.o. male kindly referred for evaluation of hearing loss  Initial visit with me (08/2024): Discussed the use of AI scribe software for clinical note transcription with the patient, who gave verbal consent to proceed.  History of Present Illness Hayden Rose is a 74 year old male who presents with worsening hearing loss and tinnitus.  He has experienced progressive hearing loss for many years, with difficulty understanding conversations unless directly facing the speaker. The hearing loss is more pronounced in the right ear, though he cannot discern a difference.  He has also persistent bilateral non-pulsatile tinnitus is described as a ringing sensation in the middle of his head. There is no ear pain, fullness, or drainage. No recent ear infections or vertigo are reported.  He reports sinus issues, particularly nasal congestion at night and in the morning, with clear nasal discharge. No allergy symptoms or frequent sinus infections are reported.   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 No sound or pressure induced vertigo.  --------------------------------------------------------- 11/15/2024 Seen in follow up. He reports that he is continuing to have stable hearing loss but tinnitus is the most bothersome symptom. Still is slowly getting worse. No ear pain or fullness or autophony or sound or pressure induced vertigo or drainage. No ETD sx.  H&N Surgery: T&A Personal or FHx of bleeding dz or anesthesia difficulty: no  AP/AC: ASA 81  Tobacco: former, quit  PMHx: HTN, h/o substance use (remote), Hypothyroidism, TIA  Independent  Review of Additional Tests or Records:  Dr. Okey (07/16/2024): noted worsening hearing, bilateral tinnitus for years. Congestion with allergies; Dx: Mixed hearing loss; repeat HT, consider imaging; b/l tinnitus Labs CMP 03/15/2024: BUN/Cr 30/1.37 [facility] Audio 05/31/2024: AD/AS - C/As tymps; WRT 96/100% AD/AS at 70dB HL   08/2024 Audiogram was independently reviewed and interpreted by me and it reveals - B/B tymps; WRT 100% AU at 80/70dB AD/AS - noted bilateral mixed hearing loss worst in lower frequencies then becomes essentially SNHL mod on left and right with small ABG 6K Hz.    SNHL= Sensorineural hearing loss  CT Head 12/25/2021 independently interpreted with respect to ears: cuts thick so suboptimal eval but noted to have sclerotic mastoids b/l with antrum b/l aerted with aerated b/l ME space; no noted ossicular chain or otic capsule pathology  CT Temporal bones 09/21/2024 independently interpreted: Noted b/l sclerotic mastoids but mastoids and ME well aerated; ossicular chain appears intact; do not see any evidence of cochlear otosclerosis but wonder if there is a small area of lucency left fistula antefenestrum suggestive of otosclerosis. It is not clear.   PMH/Meds/All/SocHx/FamHx/ROS:   Past Medical History:  Diagnosis Date   Anxiety    Arthritis    Back pain    chronic   Chronic kidney disease    stage III  renal disease    Degenerative disc disease    Depression    Dizziness    Dupuytren's contracture of right hand    Dyspnea    Elevated LFTs    Erectile dysfunction    Fall at home 08/12/2020   GERD (gastroesophageal reflux disease)    HLD (hyperlipidemia)    Hypertension    Hypothyroidism  Jaundice with stoppage of bile flow    after stent romoved   Lewy body dementia (HCC)    Memory deficits 04/23/2013   Orthostatic hypotension    Osgood-Schlatter's disease    Restless leg syndrome 04/23/2013   Skin cancer    basal cell on back x2   Sleep apnea with use of  continuous positive airway pressure (CPAP)    Stroke (HCC)    pt states mini strokes - no deficits   Tachycardia    heart monitor last week for 48 hours no report yet     Past Surgical History:  Procedure Laterality Date   APPENDECTOMY     BASAL CELL CARCINOMA EXCISION  2008,2009   CARPAL TUNNEL RELEASE  2003   CERVICAL FUSION  1985   CHOLECYSTECTOMY     GALLBLADDER SURGERY  2007-2008   LIVER BIOPSY  12/27/2011   Procedure: LIVER BIOPSY;  Surgeon: Lamar JONETTA Aho, MD;  Location: [facility];  Service: Endoscopy;  Laterality: N/A;  pt moved from 3/22 to 3/18 ( aw)   LUMBAR FUSION  1996   POSTERIOR CERVICAL LAMINECTOMY Left 10/15/2024   Procedure: POSTERIOR CERVICAL LAMINECTOMY CERVICAL SEVEN-THORACIC ONE LEFT;  Surgeon: Joshua Alm Hamilton, MD;  Location: [facility] OR;  Service: Neurosurgery;  Laterality: Left;  Laminectomy and Foraminotomy - C7-T1 - left   TONSILLECTOMY      Family History  Problem Relation Age of Onset   Coronary artery disease Mother    Heart attack Mother        stents in neck and heart ?   Crohn's disease Mother    Cirrhosis Father    Cirrhosis Sister    Prostate cancer Maternal Uncle    Liver cancer Cousin    Colitis Daughter    Diabetes Maternal Uncle    Diabetes Brother    Kidney disease Maternal Aunt    Kidney disease Cousin      Social Connections: Not on file      Current Outpatient Medications:    atorvastatin  (LIPITOR ) 10 MG tablet, Take 10 mg by mouth at bedtime., Disp: , Rfl:    busPIRone  (BUSPAR ) 10 MG tablet, Take 10 mg by mouth 2 (two) times daily., Disp: , Rfl:    carbidopa -levodopa  (SINEMET  IR) 25-100 MG tablet, Take 1 tablet by mouth 3 (three) times daily., Disp: , Rfl:    Carboxymethylcellulose Sod PF 1 % GEL, Place 1 drop into both eyes at bedtime., Disp: , Rfl:    colestipol  (COLESTID ) 1 g tablet, Take 1 g by mouth 2 (two) times daily., Disp: , Rfl:    Cyanocobalamin  (B-12) 5000 MCG CAPS, Take 1 tablet by mouth daily., Disp: , Rfl:     diphenoxylate -atropine  (LOMOTIL ) 2.5-0.025 MG tablet, Take 1 tablet by mouth in the morning and at bedtime., Disp: , Rfl:    DULoxetine  (CYMBALTA ) 30 MG capsule, Take 30 mg by mouth 2 (two) times daily., Disp: , Rfl:    fluticasone  (FLONASE ) 50 MCG/ACT nasal spray, Place 2 sprays into both nostrils daily., Disp: 16 g, Rfl: 6   gabapentin  (NEURONTIN ) 100 MG capsule, Take 100 mg by mouth 3 (three) times daily., Disp: , Rfl:    HYDROcodone -acetaminophen  (NORCO/VICODIN) 5-325 MG tablet, Take by mouth., Disp: , Rfl:    levothyroxine  (SYNTHROID ) 137 MCG tablet, Take 137 mcg by mouth daily before breakfast., Disp: , Rfl:    lidocaine  (LIDODERM ) 5 %, Place onto the skin., Disp: , Rfl:    methocarbamol  (ROBAXIN ) 500 MG tablet, Take 500 mg  by mouth 3 (three) times daily as needed., Disp: , Rfl:    mirtazapine (REMERON) 30 MG tablet, Take 30 mg by mouth at bedtime., Disp: , Rfl:    Multiple Vitamin (MULTIVITAMIN WITH MINERALS) TABS tablet, Take 1 tablet by mouth daily., Disp: , Rfl:    pregabalin  (LYRICA ) 200 MG capsule, Take 200 mg by mouth in the morning, at noon, and at bedtime., Disp: , Rfl:    terazosin (HYTRIN) 5 MG capsule, Take 5 mg by mouth at bedtime. (Patient not taking: Reported on 11/15/2024), Disp: , Rfl:    Physical Exam:   BP (!) 185/77 (BP Location: Right Arm, Patient Position: Sitting, Cuff Size: Large)   Pulse 85   Ht 5' 8 (1.727 m)   Wt 182 lb (82.6 kg)   SpO2 95%   BMI 27.67 kg/m   Salient findings:  CN II-XII intact Given history and complaints, ear microscopy was indicated and performed for evaluation with findings as below in physical exam section and in procedures;  Bilateral EAC clear and TM intact with well pneumatized middle ear spaces - fair amount of diffuse myringosclerosis, modest pars flaccida retraction AU; no evidence of cholesteatoma Weber Rose: reports mid Hayden Rose: AC > BC b/l  Weber 126: mid; Hayden 256 AC>BC b/l Anterior rhinoscopy: Septum intact; bilateral  inferior turbinates without significant hypertrophy No lesions of oral cavity/oropharynx No obviously palpable neck masses/lymphadenopathy/thyromegaly No respiratory distress or stridor  Seprately Identifiable Procedures:  Prior to initiating any procedures, risks/benefits/alternatives were explained to the patient and verbal consent obtained Procedure: Bilateral ear microscopy using microscope (CPT 92504) Pre-procedure diagnosis: bilateral mixed hearing loss Post-procedure diagnosis: same Indication: see above; given patient's otologic complaints and history, for improved and comprehensive examination of external ear and tympanic membrane, bilateral otologic examination using microscope was performed. Prior to proceeding, verbal consent was obtained after discussion of R/B/A  Procedure: Patient was placed semi-recumbent. Both ear canals were examined using the microscope with findings above. Patient tolerated the procedure well.   Impression & Plans:  Hayden Rose is a 74 y.o. male with:  1. Mixed conductive and sensorineural hearing loss of both ears   2. Tinnitus of both ears   3. Bilateral hearing loss, unspecified hearing loss type    Unclear etiology for conductive component -- perhaps Otosclerosis v/s just tympanosclerosis? 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 CT overall without effusion or clear cause. We discussed his options: observation, amplification v/s ME exploration. We discussed R/B/A for ME exploration. He reports that his tinnitus is the most bothersome symptom and I did explain that even with a successful exploration, he may continue to have tinnitus given his HF SNHL.   As such, he would like to start with HA which is reasonable. He'll plan to do this at the [facility] in 1 year, sooner as necessary  See below regarding exact medications prescribed this encounter including dosages and route: No orders of the  defined types were placed in this encounter.     Thank you for allowing me the opportunity to care for your patient. Please do not hesitate to contact me should you have any other questions.  Sincerely, Eldora Blanch, MD Otolaryngologist (ENT), [facility] Phone: [phone] Fax: [phone]  11/15/2024, 9:28 AM   MDM:  [phone] Complexity/Problems addressed: mod - chronic worsening problem Data complexity: mod - independent CT interpretation - Morbidity: low - Prescription Drug prescribed or managed: n
